# Patient Record
Sex: Female | Born: 1980 | Race: White | Hispanic: No | Marital: Single | Smoking: Never smoker
Health system: Southern US, Community
[De-identification: ages and names within clinical notes are randomized; demographics above are authoritative.]

## PROBLEM LIST (undated history)

## (undated) DIAGNOSIS — Z8742 Personal history of other diseases of the female genital tract: Secondary | ICD-10-CM

## (undated) DIAGNOSIS — K219 Gastro-esophageal reflux disease without esophagitis: Secondary | ICD-10-CM

## (undated) DIAGNOSIS — Z86718 Personal history of other venous thrombosis and embolism: Secondary | ICD-10-CM

## (undated) DIAGNOSIS — G709 Myoneural disorder, unspecified: Secondary | ICD-10-CM

## (undated) DIAGNOSIS — M329 Systemic lupus erythematosus, unspecified: Secondary | ICD-10-CM

## (undated) DIAGNOSIS — I998 Other disorder of circulatory system: Secondary | ICD-10-CM

## (undated) DIAGNOSIS — D1800 Hemangioma unspecified site: Secondary | ICD-10-CM

## (undated) DIAGNOSIS — I959 Hypotension, unspecified: Secondary | ICD-10-CM

## (undated) DIAGNOSIS — Q2112 Patent foramen ovale: Secondary | ICD-10-CM

## (undated) DIAGNOSIS — M797 Fibromyalgia: Secondary | ICD-10-CM

## (undated) DIAGNOSIS — N946 Dysmenorrhea, unspecified: Secondary | ICD-10-CM

## (undated) DIAGNOSIS — I639 Cerebral infarction, unspecified: Secondary | ICD-10-CM

## (undated) DIAGNOSIS — D689 Coagulation defect, unspecified: Secondary | ICD-10-CM

## (undated) DIAGNOSIS — G43009 Migraine without aura, not intractable, without status migrainosus: Secondary | ICD-10-CM

## (undated) DIAGNOSIS — G35 Multiple sclerosis: Secondary | ICD-10-CM

## (undated) DIAGNOSIS — A879 Viral meningitis, unspecified: Secondary | ICD-10-CM

## (undated) DIAGNOSIS — R011 Cardiac murmur, unspecified: Secondary | ICD-10-CM

## (undated) DIAGNOSIS — Z8719 Personal history of other diseases of the digestive system: Secondary | ICD-10-CM

## (undated) HISTORY — DX: Hypotension, unspecified: I95.9

## (undated) HISTORY — DX: Personal history of other diseases of the female genital tract: Z87.42

## (undated) HISTORY — DX: Other disorder of circulatory system: I99.8

## (undated) HISTORY — PX: PELVIC LAPAROSCOPY: SHX162

## (undated) HISTORY — PX: UMBILICAL HERNIA REPAIR: SHX196

## (undated) HISTORY — DX: Myoneural disorder, unspecified: G70.9

## (undated) HISTORY — PX: ABDOMINAL HYSTERECTOMY: SHX81

## (undated) HISTORY — DX: Multiple sclerosis: G35

## (undated) HISTORY — DX: Hemangioma unspecified site: D18.00

## (undated) HISTORY — DX: Personal history of other venous thrombosis and embolism: Z86.718

## (undated) HISTORY — DX: Cerebral infarction, unspecified: I63.9

## (undated) HISTORY — DX: Cardiac murmur, unspecified: R01.1

## (undated) HISTORY — DX: Dysmenorrhea, unspecified: N94.6

## (undated) HISTORY — PX: UPPER GI ENDOSCOPY: SHX6162

## (undated) HISTORY — DX: Coagulation defect, unspecified: D68.9

## (undated) HISTORY — DX: Patent foramen ovale: Q21.12

## (undated) HISTORY — DX: Gastro-esophageal reflux disease without esophagitis: K21.9

## (undated) HISTORY — DX: Migraine without aura, not intractable, without status migrainosus: G43.009

## (undated) HISTORY — PX: TONSILLECTOMY: SUR1361

---
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
## 2009-11-27 HISTORY — PX: OTHER SURGICAL HISTORY: SHX169

## 2017-07-30 ENCOUNTER — Encounter: Payer: Self-pay | Admitting: Internal Medicine

## 2017-12-24 DIAGNOSIS — R251 Tremor, unspecified: Secondary | ICD-10-CM | POA: Insufficient documentation

## 2017-12-24 DIAGNOSIS — G9389 Other specified disorders of brain: Secondary | ICD-10-CM | POA: Insufficient documentation

## 2017-12-24 DIAGNOSIS — R32 Unspecified urinary incontinence: Secondary | ICD-10-CM | POA: Insufficient documentation

## 2017-12-24 DIAGNOSIS — R200 Anesthesia of skin: Secondary | ICD-10-CM | POA: Insufficient documentation

## 2017-12-24 DIAGNOSIS — G959 Disease of spinal cord, unspecified: Secondary | ICD-10-CM | POA: Insufficient documentation

## 2018-01-22 DIAGNOSIS — B977 Papillomavirus as the cause of diseases classified elsewhere: Secondary | ICD-10-CM | POA: Insufficient documentation

## 2018-07-09 ENCOUNTER — Encounter: Payer: Self-pay | Admitting: Internal Medicine

## 2018-10-27 HISTORY — PX: ANTERIOR CRUCIATE LIGAMENT REPAIR: SHX115

## 2018-12-04 ENCOUNTER — Ambulatory Visit (INDEPENDENT_AMBULATORY_CARE_PROVIDER_SITE_OTHER): Payer: Self-pay | Admitting: Internal Medicine

## 2018-12-04 VITALS — BP 102/70 | HR 62 | Temp 99.2°F

## 2018-12-04 DIAGNOSIS — Z4789 Encounter for other orthopedic aftercare: Secondary | ICD-10-CM

## 2018-12-08 ENCOUNTER — Encounter: Payer: Self-pay | Admitting: Internal Medicine

## 2018-12-08 NOTE — Progress Notes (Signed)
   Subjective:    Patient ID: Christy Lowe, female    DOB: 1981-09-01, 38 y.o.   MRN: [id number]  HPI 38 year old Female presents  to the office for the first time today.  Until recently she was living in [place].  On February 24, 2018 she was riding a horse in [place].  Doristine Bosworth was muddy and the horse started to buck and subsequently fell on her injuring her left knee.  She saw an orthopedist in [place] who reviewed her MRI.  There was a complete tear of the ACL and was likely a posterior horn medial meniscal tear as well.  Possible posterior horn lateral meniscal tear.  Bone contusions were present on the posterior aspect of the lateral tibial plateau and the lateral femoral condyle.  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  She elected to have surgery in [place] where her sister was living.  She had that surgery a few weeks ago and is doing well with crutches.  She has just recently rented a house here in [place] and will be staying here but traveling for work.  She needs to continue to have left knee physical therapy.  Had some physical therapy in [place].  Past medical history includes fibrocystic breast disease with mammogram done in [facility] January 2019.  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  Pap smear was unremarkable according to records  Multiple drug allergies including sulfa, Keflex, penicillin and lubiprostone  Patient says she has been seen by neurologist in [place] and was diagnosed with multiple sclerosis.  Apparently has had a number of MRIs in 2017 of the spine and brain.  Do not have these results on file.  Old records indicates she had a hernia repair in January 2013.  Had arthroscopic surgery of the knee in 2013.  Initially began MRI studies in 2015 in [place].  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  I have asked her to try to  find her medical records from [place], [place] and [place].  Review of Systems     Objective:   Physical Exam  I spent 20 minutes speaking with her about these issues.  The main issue today is that she needs to have physical therapy here locally so I have written an order for that.  She will try to locate records from [place] and [place] as well as [place] for her file here.      Assessment & Plan:  Status post left knee surgery in [place] for internal derangement  Plan: Order for physical therapy written

## 2018-12-08 NOTE — Patient Instructions (Signed)
Order for physical therapy written.  Please try to locate your records from [place], [place] and [place] to have on file here

## 2019-01-13 ENCOUNTER — Encounter: Payer: Self-pay | Admitting: Internal Medicine

## 2019-01-13 ENCOUNTER — Telehealth: Payer: Self-pay

## 2019-01-13 ENCOUNTER — Ambulatory Visit (INDEPENDENT_AMBULATORY_CARE_PROVIDER_SITE_OTHER): Payer: PRIVATE HEALTH INSURANCE | Admitting: Internal Medicine

## 2019-01-13 VITALS — BP 140/80 | HR 87 | Temp 99.1°F | Wt 189.0 lb

## 2019-01-13 DIAGNOSIS — J029 Acute pharyngitis, unspecified: Secondary | ICD-10-CM | POA: Diagnosis not present

## 2019-01-13 DIAGNOSIS — J0100 Acute maxillary sinusitis, unspecified: Secondary | ICD-10-CM | POA: Diagnosis not present

## 2019-01-13 DIAGNOSIS — H6503 Acute serous otitis media, bilateral: Secondary | ICD-10-CM

## 2019-01-13 DIAGNOSIS — R0981 Nasal congestion: Secondary | ICD-10-CM

## 2019-01-13 DIAGNOSIS — H9203 Otalgia, bilateral: Secondary | ICD-10-CM

## 2019-01-13 MED ORDER — AZITHROMYCIN 250 MG PO TABS: ORAL_TABLET | ORAL | 1 refills | Status: DC

## 2019-01-13 MED ORDER — METHYLPREDNISOLONE ACETATE 80 MG/ML IJ SUSP
80.0000 mg | Freq: Once | INTRAMUSCULAR | Status: AC
  Administered 2019-01-13: 80 mg via INTRAMUSCULAR

## 2019-01-13 MED ORDER — VALACYCLOVIR HCL 500 MG PO TABS: 500.0000 mg | ORAL_TABLET | Freq: Every day | ORAL | 11 refills | Status: AC

## 2019-01-13 NOTE — Telephone Encounter (Signed)
Patient called is requesting a zpak for sinusitis she said Nicole Kindred told her to call to request this.

## 2019-01-13 NOTE — Telephone Encounter (Signed)
Please make appt

## 2019-01-13 NOTE — Progress Notes (Signed)
   Subjective:    Patient ID: Christy Lowe, female    DOB: 18-Apr-1981, 38 y.o.   MRN: [id number]  HPI 38 year old Female with hx recurrent sinusitis. Had sinus surgery in 2016. Was in [place] last week and then in [place]. Came down with respiratory symptoms. Took Claritin. No fever. Went to [facility] yesterday in [place].  They did not prescribe any antibiotics.  She drove home.  She feels terrible.  A lot of sinus pressure and sore throat.    Review of Systems no fever or shaking chills.  Has sinus pressure, malaise and fatigue     Objective:   Physical Exam Skin warm and dry.  Nodes none.  Pharynx very slightly injected.  Neck supple.  Chest clear to auscultation without rales or wheezing.  She sounds nasally congested and looks fatigued.  Vital signs reviewed.  TMs full but not red.       Assessment & Plan:  Acute maxillary sinusitis  Plan: Depo-Medrol 80 mg IM.  Zithromax Z-PAK take 2 p.o. day 1 followed by 1 p.o. days 2 through 5.  Says this is worked well for her in the past.  Rest and drink plenty of fluids.

## 2019-01-17 ENCOUNTER — Encounter: Payer: Self-pay | Admitting: Internal Medicine

## 2019-01-17 NOTE — Patient Instructions (Signed)
Take Zithromax Z-PAK as directed.  Depo-Medrol 80 mg IM given in office.  Rest and drink plenty of fluids.

## 2019-04-01 ENCOUNTER — Telehealth: Payer: Self-pay

## 2019-04-01 NOTE — Telephone Encounter (Signed)
Set up virtual visit 

## 2019-04-01 NOTE — Telephone Encounter (Signed)
Patient called is wanting to talk with you about how to move forward with a biopsy she said you are aware of what's going and she would like to speak with you briefly. She said her oncologist was supposed to be faxing notes to you. She is kay with virtual visit.

## 2019-04-01 NOTE — Telephone Encounter (Signed)
Spoke with Christy Lowe and she stated that we should receive records from oncologist in the next 24 hours, once we receive those we will set up virtual visit to discuss.

## 2019-04-03 ENCOUNTER — Ambulatory Visit (INDEPENDENT_AMBULATORY_CARE_PROVIDER_SITE_OTHER): Payer: PRIVATE HEALTH INSURANCE | Admitting: Internal Medicine

## 2019-04-03 ENCOUNTER — Other Ambulatory Visit: Payer: Self-pay

## 2019-04-03 DIAGNOSIS — R222 Localized swelling, mass and lump, trunk: Secondary | ICD-10-CM

## 2019-04-03 NOTE — Telephone Encounter (Signed)
Received notes from oncologist, set up virtual visit

## 2019-04-04 ENCOUNTER — Other Ambulatory Visit: Payer: PRIVATE HEALTH INSURANCE | Admitting: Internal Medicine

## 2019-04-04 ENCOUNTER — Other Ambulatory Visit: Payer: Self-pay

## 2019-04-04 ENCOUNTER — Encounter: Payer: PRIVATE HEALTH INSURANCE | Admitting: Internal Medicine

## 2019-04-07 ENCOUNTER — Other Ambulatory Visit: Payer: PRIVATE HEALTH INSURANCE | Admitting: Internal Medicine

## 2019-04-07 VITALS — Temp 98.4°F

## 2019-04-10 ENCOUNTER — Encounter: Payer: Self-pay | Admitting: Internal Medicine

## 2019-04-10 ENCOUNTER — Ambulatory Visit (INDEPENDENT_AMBULATORY_CARE_PROVIDER_SITE_OTHER): Payer: PRIVATE HEALTH INSURANCE | Admitting: Internal Medicine

## 2019-04-10 ENCOUNTER — Other Ambulatory Visit: Payer: Self-pay

## 2019-04-10 VITALS — BP 100/60 | HR 84 | Temp 98.3°F | Ht 66.0 in | Wt 185.0 lb

## 2019-04-10 DIAGNOSIS — E7800 Pure hypercholesterolemia, unspecified: Secondary | ICD-10-CM | POA: Diagnosis not present

## 2019-04-10 DIAGNOSIS — Z8742 Personal history of other diseases of the female genital tract: Secondary | ICD-10-CM | POA: Diagnosis not present

## 2019-04-10 DIAGNOSIS — Z87798 Personal history of other (corrected) congenital malformations: Secondary | ICD-10-CM

## 2019-04-10 DIAGNOSIS — R1310 Dysphagia, unspecified: Secondary | ICD-10-CM | POA: Diagnosis not present

## 2019-04-10 DIAGNOSIS — Z0000 Encounter for general adult medical examination without abnormal findings: Secondary | ICD-10-CM

## 2019-04-10 DIAGNOSIS — R222 Localized swelling, mass and lump, trunk: Secondary | ICD-10-CM

## 2019-04-10 DIAGNOSIS — Z6829 Body mass index (BMI) 29.0-29.9, adult: Secondary | ICD-10-CM

## 2019-04-10 DIAGNOSIS — Z8669 Personal history of other diseases of the nervous system and sense organs: Secondary | ICD-10-CM | POA: Diagnosis not present

## 2019-04-10 DIAGNOSIS — Z86718 Personal history of other venous thrombosis and embolism: Secondary | ICD-10-CM | POA: Diagnosis not present

## 2019-04-11 LAB — CBC WITH DIFFERENTIAL/PLATELET
Absolute Monocytes: 418 cells/uL (ref 200–950)
Basophils Absolute: 31 cells/uL (ref 0–200)
Basophils Relative: 0.6 %
Eosinophils Absolute: 92 cells/uL (ref 15–500)
Eosinophils Relative: 1.8 %
HCT: 38.1 % (ref 35.0–45.0)
Hemoglobin: 12.9 g/dL (ref 11.7–15.5)
Lymphs Abs: 1301 cells/uL (ref 850–3900)
MCH: 30 pg (ref 27.0–33.0)
MCHC: 33.9 g/dL (ref 32.0–36.0)
MCV: 88.6 fL (ref 80.0–100.0)
MPV: 10.4 fL (ref 7.5–12.5)
Monocytes Relative: 8.2 %
Neutro Abs: 3259 cells/uL (ref 1500–7800)
Neutrophils Relative %: 63.9 %
Platelets: 263 10*3/uL (ref 140–400)
RBC: 4.3 10*6/uL (ref 3.80–5.10)
RDW: 13.2 % (ref 11.0–15.0)
Total Lymphocyte: 25.5 %
WBC: 5.1 10*3/uL (ref 3.8–10.8)

## 2019-04-11 LAB — SEDIMENTATION RATE: Sed Rate: 9 mm/h (ref 0–20)

## 2019-04-11 LAB — CP4508-PT/INR AND PTT
INR: 1
Prothrombin Time: 10 s (ref 9.0–11.5)
aPTT: 32 s (ref 22–34)

## 2019-04-11 LAB — LIPID PANEL
Cholesterol: 192 mg/dL (ref <200)
HDL: 55 mg/dL (ref >=50)
LDL Cholesterol (Calc): 120 mg/dL (calc) — ABNORMAL HIGH
Non-HDL Cholesterol (Calc): 137 mg/dL (calc) — ABNORMAL HIGH (ref <130)
Total CHOL/HDL Ratio: 3.5 (calc) (ref <5.0)
Triglycerides: 80 mg/dL (ref <150)

## 2019-04-11 LAB — COMPLETE METABOLIC PANEL WITH GFR
AG Ratio: 1.7 (calc) (ref 1.0–2.5)
ALT: 10 U/L (ref 6–29)
AST: 16 U/L (ref 10–30)
Albumin: 4.4 g/dL (ref 3.6–5.1)
Alkaline phosphatase (APISO): 66 U/L (ref 31–125)
BUN: 14 mg/dL (ref 7–25)
CO2: 26 mmol/L (ref 20–32)
Calcium: 9.4 mg/dL (ref 8.6–10.2)
Chloride: 104 mmol/L (ref 98–110)
Creat: 0.86 mg/dL (ref 0.50–1.10)
GFR, Est African American: 101 mL/min/{1.73_m2} (ref >=60)
GFR, Est Non African American: 88 mL/min/{1.73_m2} (ref >=60)
Globulin: 2.6 g/dL (calc) (ref 1.9–3.7)
Glucose, Bld: 86 mg/dL (ref 65–99)
Potassium: 4.4 mmol/L (ref 3.5–5.3)
Sodium: 137 mmol/L (ref 135–146)
Total Bilirubin: 0.5 mg/dL (ref 0.2–1.2)
Total Protein: 7 g/dL (ref 6.1–8.1)

## 2019-04-11 LAB — FACTOR 5 LEIDEN

## 2019-04-11 LAB — TSH: TSH: 2.61 mIU/L

## 2019-04-14 ENCOUNTER — Other Ambulatory Visit: Payer: PRIVATE HEALTH INSURANCE

## 2019-04-21 ENCOUNTER — Encounter: Payer: Self-pay | Admitting: Internal Medicine

## 2019-04-21 NOTE — Patient Instructions (Signed)
Records reviewed from [place] patient did not have palpable abnormality to biopsy in left supraclavicular area.  There is a history of d-dimer elevation that is mild.  Patient will have records from [place].  She will schedule a physical exam in the near future.

## 2019-04-21 NOTE — Progress Notes (Signed)
Subjective:    Patient ID: Christy Lowe, female    DOB: 25-Nov-1981, 38 y.o.   MRN: [id number]  HPI 38 year old White Female with complex past medical history as moved here in [place] from [place].  Previously lived in [place] before moving to [place].  Currently at the time of visit, I do not have old records from [place].  Patient says she will have these faxed over.  These records were received after the visit.  Spent 20 minutes reviewing these records.  Seen today by interactive audio and video telecommunications due to coronavirus pandemic.  Patient agrees to visit via this format today.  She is identified using 2 identifiers as [place].  Patient has some concerns about fullness in the left supraclavicular area which is been present for some time.  She is questioning need for biopsy.  Records subsequently obtained late afternoon after her visit from [facility] in [place], [place] dated July 09, 2018 and indicate patient was seen there regarding mass left side of the neck.  Was found not to have any palpable adenopathy but a fullness in the left supraclavicular area.  Apparently she underwent a CT of the neck and chest January 18, 2018 for this fullness/neck mass.  There were some mildly prominent cervical chain lymph nodes 9 x 6 mm on the left.  Thyroglossal duct cyst was present.  Was referred to interventional radiology regarding possible biopsy but biopsy was not recommended by interventional radiology March 2019.  Had repeat CT of the neck May 2019 which was unrevealing.  Explained to patient that her past medical history was quite complex and it would be best to have all of her records from [place] and [place] sent here and schedule a physical examination.  She agrees to do this in the near future.     Review of Systems see above     Objective:   Physical Exam  Not examined.  Spent 15 minutes including review of records from [place] 0  dated July 09, 2018 outlining work-up for possible left supraclavicular mass.  2 BBs were placed on the left indicating palpable abnormalities the BB in the left neck just above the thyroid overlying the sternocleidomastoid muscle was without underlying abnormality.  Mildly prominent cervical chain lymph nodes were noted.  The second may be which marked palpable abnormality showed no underlying abnormality.  A thyroglossal duct cyst was present.  This is been seen previously.  There is also some history of possible clotting disorder which is vague.  Patient indicates she had a history of elevated d-dimer documented in 20 16/2017.  The origin for this finding is not clear.  It is unclear the clinical relevance of the elevated d-dimer.  D-dimer checked by her oncologist in [facility] by Dr. Mcarthur Rossetti was elevated at 562 with normal being less than 500  Patient reports history of DVT in the past.  Patient takes 81 mg of aspirin daily.  No family history of clotting disorder that patient is aware of.      Assessment & Plan:  History of fullness in left supraclavicular area but no mass detected on CT scan and interventional radiology in [place] did not think biopsy was indicated  History of elevated d-dimer of 562 normal being less than 500 with history of DVT.  Would like patient checked for factor V Leiden.  She is not aware of any family history of clotting disorder.  Patient agrees to schedule physical  exam in the near future to sort out complex medical issues and questions she has about her health.  Patient also mentions the possibility of having multiple sclerosis.  25 minutes spent with patient visit and reviewing records [place].

## 2019-04-22 ENCOUNTER — Other Ambulatory Visit: Payer: Self-pay

## 2019-04-22 MED ORDER — TRAZODONE HCL 100 MG PO TABS: 100.0000 mg | ORAL_TABLET | Freq: Every day | ORAL | 1 refills | Status: DC

## 2019-04-22 MED ORDER — ALPRAZOLAM 0.5 MG PO TABS: 0.5000 mg | ORAL_TABLET | Freq: Every day | ORAL | 0 refills | Status: DC | PRN

## 2019-04-29 ENCOUNTER — Other Ambulatory Visit: Payer: Self-pay | Admitting: Internal Medicine

## 2019-04-29 ENCOUNTER — Ambulatory Visit
Admission: RE | Admit: 2019-04-29 | Discharge: 2019-04-29 | Disposition: A | Discharge status: Home / Self Care | Payer: PRIVATE HEALTH INSURANCE | Source: Ambulatory Visit | Attending: Internal Medicine | Admitting: Internal Medicine

## 2019-04-29 DIAGNOSIS — R1310 Dysphagia, unspecified: Secondary | ICD-10-CM

## 2019-04-29 DIAGNOSIS — K222 Esophageal obstruction: Secondary | ICD-10-CM

## 2019-04-29 DIAGNOSIS — R29810 Facial weakness: Secondary | ICD-10-CM

## 2019-04-29 NOTE — Telephone Encounter (Signed)
Pt has hx of elevated d-dimer. Recent Factor V Leiden testing was normal. Has complex history. I called to give her results of barium swallow like has peptic stricture. GI referral being made. Last night, had sudden onset with memory, thick speech and right mouth droop. Took 2 ASA tabs. There is some question of multiple sclerosis although we do not have MRIs to review- only have reports from out of state. No extremity weakness. Syptoms resolved spontaneously and she did not go to ED. ? TIA to see Neurologist as well.

## 2019-04-30 ENCOUNTER — Encounter: Payer: Self-pay | Admitting: Internal Medicine

## 2019-04-30 NOTE — Patient Instructions (Addendum)
We will proceed with barium swallow to rule out stricture.  Likely will need neurological consultation to sort out issues of right-sided facial weakness history with of episodic headaches consistent with migraine.  Could have complicated migraine versus recurrent TIAs.  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  Patient is under the impression she may have multiple sclerosis.

## 2019-04-30 NOTE — Progress Notes (Signed)
Subjective:    Patient ID: Christy Lowe, female    DOB: May 28, 1981, 38 y.o.   MRN: [id number]  HPI 38 year old Female presents to the office for health maintenance exam and evaluation of multiple complex medical issues.   She is allergic to cephalexin causes hives, penicillin causes hives, sulfa causes shortness of breath lubiprostone causes shortness of breath.  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  ACL repair was done in [place] where her sister resides but she received physical therapy here for several weeks postoperatively.  She has now recovered well.  Menarche at age 8, no pregnancies or miscarriages.  Non-smoker.  Social alcohol consumption.  SHx: living with female partner who is Customer service manager and judge. She is a Clinical research associate. Sister lives in [place].  Family history: Maternal grandmother with history of myelodysplastic syndrome.  Paternal aunt with breast cancer and cervical cancer in her 40s.  Paternal grandfather with prostate cancer in his 23s.  No apparent history of clotting disorders in the family.  Prior records indicates she had a hernia repair in January 2013, arthroscopic surgery of the knee in 2013.  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  I have asked her to find her medical records but she has yet to do so other than what we have here from [place].  I was able to get some on care everywhere from [place].  Patient has a history of migraine headaches and has had 3 over the past 5 months.  Takes ibuprofen.  History of thyroglossal  duct cyst noted on CT scan.  She is complaining of fullness in the left supraclavicular area.  Review of old records indicate there was no mass noted in that area.  She did have some small pulmonary nodules on chest CT.  These are thought to be benign and stable from previous reports.  Patient indicates that she had meningitis related to about infectious mononucleosis at age 51.  Said she developed a blood clot in her right leg at age 83.  Says at age 81 she had a TIA with right facial droop and right arm weakness while on oral contraceptives.  Records from [place] dated September 2018 indicates she had low back pain with an MRI of the LS spine showing focal posterior disc protrusion L5-S1 with annular tear.  Today she was tested for factor V Leiden coagulopathy and that result is negative.  She had a CT of the neck and soft tissues surrounding the neck area May 2019 for possible left neck mass due to supraclavicular fullness detected on exam.  Thyroid gland was normal.  Submandibular glands were normal.  BBs were placed on the left supraclavicular area where abnormalities were noted.  No underlying abnormality was found.  Only abnormal finding was a thyroglossal duct cyst.  Follow-up CT was recommended in February 2020 but patient moved away from [place] and this was not done.  Patient is concerned about this.  It has not caused her any issues.  D-dimer was noted to be 562 with normal being less than 500 February 2019  She  actually had 2 CTs of the neck in 2019 1 in February and the repeat one in May which did not show any change in findings described a new finding.  She had a limited ultrasound of the thyroid and neck in March 2019 with benign appearing small cervical lymph nodes.  No biopsy was performed because they could not locate a definite area to biopsy.  In September 2018 she was admitted to hospital in [place] with right-sided facial numbness.  She was not given TPA.  MRI did not show  any demyelinating disease of the brain or LS-spine.  She was thought not to have a stroke and was treated with prednisone for 5 days.  There was a 1.3 x 0.9 cm mass along the anterior falx which was thought to represent an area of osseous metaplasia.  In 2011 she had an endoscopy and an esophageal biopsy.  This was for complaint of dysphagia.  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  Patient apparently did not understand the tumor was benign.  A lot of her medical care has been fragmented.  I have been trying to pull this together for her over the past several weeks.  She needs to have some answers about her neurological status.  We will seek neurology consultation.  She needs to have GI consultation regarding dysphasia.  We are going to start with a barium swallow.  She may have a stricture or ring that needs to be dilated.  I have tested her today for factor V Leiden which would be a common cause of DVTs but she is negative for that.  I am not sure that relevance of the very mildly elevated d-dimer.  For now I think it is wise for her to stay on aspirin 325 mg daily.  There is also the issue of whether or not she truly has multiple sclerosis.  She needs further evaluation with neurologist.  She definitely has history of migraine headaches.  She  travels extensively and it is important to get her medical issues clearly defined in case she has to seek medical care out of state.  She needs to understand what her medical issues really are.  It's all been fragmented care that is confusing to her.     Records indicates she moved from [place] to  to [place] in June 2018.  Previously married once.  No children.  In the process of selling her property in [place] and divorcing.  History of fibrocystic breast disease.  Patient says that she has history of lower extremity DVT.  Says she went to the emergency department in 2015 in [place] for right leg pain.  Doppler study was negative.  She says she  has a history of elevated d-dimer.  She takes aspirin particularly when she is driving for long distances.  Family history: History of hypertension and MI in paternal grandfather.  Brother with history of Guillain- Barr syndrome.  Extensive time has been spent reviewing her records that are available in care everywhere and knows that she has been faxed to [place].  In February 2018 she had genetic testing done at [facility] in [place].  This was for prepregnancy consultation.  Was not found to have any genetic issues that would interfere with pregnancy.  She also saw a physician regarding neurological symptoms and no time.  She had MRI/MRA of the C-spine and T-spine which were normal.  She had a normal lumbar puncture.  Patient indicates she has had  2 TIAs and that the last one was around 2014.  She currently takes aspirin 325 mg daily.  History of menorrhagia and dysmenorrhea.  Had 2D echocardiogram in 2017 showing a small PFO.  In 2011 she had endoscopy in [place] and a polyp was noted in the esophagus.  Subsequently had biopsy and it was found to be a granular cell tumor.  Patient complaining of food sticking in her throat at the present time.  Has had some severe episodes with that recently.  Barium swallow will be ordered.    Review of Systems see above she complains of fatigue.  Knee has improved and is much less painful after surgery.     Objective:   Physical Exam Vital signs reviewed and are stable.  Skin warm and dry.  No adenopathy appreciated in the cervical or left supraclavicular area.  Pharynx is clear.  Tongue is midline.  TMs are clear.  Neck is supple.  No thyromegaly appreciated.  Chest is clear to auscultation without rales or wheezing.  Cardiac exam regular rate and rhythm normal S1 and S2 without murmurs or gallops.  No S3 appreciated.  Abdomen bowel sounds are active no hepatosplenomegaly masses or tenderness.  Pelvic exam: Normal female external  genitalia.  Pap smear taken.  No masses on bimanual exam.  Rectovaginal confirms.  Extremities without deformity.  Neurological exam: Cranial nerves II through XII are grossly intact.  No facial weakness detected.  Muscle strength appears to be normal in the upper and lower extremities with deep tendon reflexes normal in the upper and lower extremities.  No visual field deficit.  PERRLA.  Gait is normal.  Cerebellar finger-to-nose testing is within normal limits in both right and left upper extremities.       Assessment & Plan:  History of?  Left supraclavicular fullness without clear evidence of adenopathy.  Patient asking about biopsy.  It would appear from previous CT scan there was nothing there to biopsy.  History of episodes of facial weakness that would suggest possible TIA versus complicated migraine.  Is also has been postulated in prior medical records that she may have multiple sclerosis.  She will be referred to neurologist.  History of migraine headaches  History of DVT-test for factor V Leiden.  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  Her labs have been reviewed including factor V Leiden which proved to be negative, sed rate of 9.  Normal CBC with differential and C met.  TSH is normal.  Lipid panel normal with the exception of an LDL of 120 with HDL of 55.  Triglycerides are normal.  Currently medications include Xanax 0.5 mg daily on a as needed basis.  Trazodone 100 mg daily.  Barium swallow to see if she has an esophageal stricture.  Referral to neurology regarding recurrent neurological symptoms consistent with migraine headache complicated versus MS.  Thyroglossal duct cyst.  Patient reassured nothing needs to be done at the present time.  ?  Left supraclavicular fullness.  Previous imaging studies have not defined a mass  or adenopathy that is conclusive.  The fullness is noted on exam but is not extreme and no palpable mass is appreciated today.  History of DVT-may need hematology consult for evaluation of possible coagulopathy.  Apparently one episode occurred when she was on oral contraceptives.  Factor V Leiden is negative.  History of anxiety  and depression treated with Desiree L and as needed Xanax.  Discussed these issues at length with patient.  She understands and agrees to proceed with these consultations and barium swallow.

## 2019-05-05 ENCOUNTER — Ambulatory Visit (INDEPENDENT_AMBULATORY_CARE_PROVIDER_SITE_OTHER): Payer: PRIVATE HEALTH INSURANCE | Admitting: Gastroenterology

## 2019-05-05 ENCOUNTER — Other Ambulatory Visit: Payer: Self-pay

## 2019-05-05 ENCOUNTER — Encounter: Payer: Self-pay | Admitting: Gastroenterology

## 2019-05-05 VITALS — Ht 67.0 in | Wt 180.0 lb

## 2019-05-05 DIAGNOSIS — R1310 Dysphagia, unspecified: Secondary | ICD-10-CM

## 2019-05-05 DIAGNOSIS — Z791 Long term (current) use of non-steroidal anti-inflammatories (NSAID): Secondary | ICD-10-CM

## 2019-05-05 DIAGNOSIS — K2100 Gastro-esophageal reflux disease with esophagitis, without bleeding: Secondary | ICD-10-CM

## 2019-05-05 DIAGNOSIS — G8929 Other chronic pain: Secondary | ICD-10-CM

## 2019-05-05 DIAGNOSIS — R1013 Epigastric pain: Secondary | ICD-10-CM

## 2019-05-05 DIAGNOSIS — R1319 Other dysphagia: Secondary | ICD-10-CM

## 2019-05-05 MED ORDER — OMEPRAZOLE 40 MG PO CPDR: 40.0000 mg | DELAYED_RELEASE_CAPSULE | Freq: Every day | ORAL | 3 refills | Status: DC

## 2019-05-05 MED ORDER — SUCRALFATE 1 G PO TABS: 1.0000 g | ORAL_TABLET | Freq: Three times a day (TID) | ORAL | 2 refills | Status: DC

## 2019-05-05 NOTE — Progress Notes (Signed)
Christy Lowe    [id number]    1981-09-22  Primary Care Physician:Baxley, Cresenciano Lick, MD  Referring Physician: [name], MD [address] [place], [place] [address]  This service was provided via audio and video telemedicine (Doximity) due to [place] 19 pandemic.  Patient location: Home Provider location: Office Used 2 patient identifiers to confirm the correct person. Explained the limitations in evaluation and management via telemedicine. Patient is aware of potential medical charges for this visit.  Patient consented to this virtual visit.  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 Prior records not available to review during this visit.  Based on chart review in epic and Dr. Verlene Mayer note patient has had extensive medical work-up in [place], [place] and also [place].  2011 EGD in [place] with removal of polypoid lesion in the esophagus, biopsy positive for granular cell tumor  Esophagram April 29, 2019 1. Small sliding hiatal hernia. Moderate gastroesophageal reflux elicited. 2. 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 No esophageal mass or ulcer detected. 3. Normal esophageal motility. 4. Normal oral and pharyngeal phases of swallowing.  Dysphagia is progressively worse after knee surgery last year.  She has most difficulty with meat specially dry chicken and steak.  She is taking over-the-counter omeprazole with no improvement  Denies any episodes of food impaction.  No vomiting, melena or blood per rectum. She has epigastric abdominal pain and burning sensation.  Takes NSAIDs intermittently.  No family history of GI malignancy     Outpatient Encounter Medications as of 05/05/2019  Medication Sig  . aspirin 325 MG tablet Take 325 mg by mouth daily. Takes every day  . DULoxetine (CYMBALTA) 30 MG capsule Take 30 mg by mouth daily.  . [DISCONTINUED] ALPRAZolam (XANAX) 0.5 MG tablet Take 1 tablet (0.5 mg total) by mouth daily as needed for anxiety. Do not exceed more than 2 a day. (Patient not taking: Reported on 05/05/2019)  . [DISCONTINUED] traZODone (DESYREL) 100 MG tablet Take 1 tablet (100 mg total) by mouth daily. (Patient not taking: Reported on 05/05/2019)   No facility-administered encounter medications on file as of 05/05/2019.     Allergies as of 05/05/2019 - Review Complete 04/30/2019  Allergen Reaction Noted  . Cephalexin Hives and Shortness Of Breath 06/23/2014  . Penicillins Hives and Anaphylaxis 06/23/2014  . Lubiprostone  01/13/2019  . Sulfa antibiotics Other (See Comments) 06/23/2014    Past Medical History:  Diagnosis Date  . Hemangioma   . Hypotension   . MS (multiple sclerosis) ([place])     Past Surgical History:  Procedure Laterality Date  . ANTERIOR CRUCIATE LIGAMENT REPAIR    . TONSILLECTOMY      Family History  Problem Relation Age of Onset  . Prostate cancer Paternal Grandfather   . Breast cancer Other     Social History   Socioeconomic History  . Marital status: Unknown    Spouse name: Not on file  . Number of children: Not on file  . Years of education: Not on file  . Highest education level: Not on file  Occupational History  . Not on file  Social Needs  . Financial resource strain: Not on  file  . Food insecurity:    Worry: Not on file    Inability: Not on file  . Transportation needs:    Medical: Not on file    Non-medical: Not on file  Tobacco Use  . Smoking status: Never Smoker  . Smokeless tobacco: Never Used  Substance and Sexual Activity  . Alcohol use: Yes    Comment: once a week  . Drug use: Not on file  . Sexual activity: Not on file  Lifestyle  . Physical  activity:    Days per week: Not on file    Minutes per session: Not on file  . Stress: Not on file  Relationships  . Social connections:    Talks on phone: Not on file    Gets together: Not on file    Attends religious service: Not on file    Active member of club or organization: Not on file    Attends meetings of clubs or organizations: Not on file    Relationship status: Not on file  . Intimate partner violence:    Fear of current or ex partner: Not on file    Emotionally abused: Not on file    Physically abused: Not on file    Forced sexual activity: Not on file  Other Topics Concern  . Not on file  Social History Narrative  . Not on file      Review of systems: Review of Systems as per HPI All other systems reviewed and are negative.   Physical Exam: Vitals were not taken and physical exam was not performed during this virtual visit.  Data Reviewed:  Reviewed labs, radiology imaging, old records and pertinent past GI work up   Assessment and Plan/Recommendations:  38 year old female with history of factor V Leyden mutation on chronic aspirin 325 mg daily, with complaints of dysphagia and epigastric abdominal pain  Barium esophagram suggestive of peptic stricture  Schedule for EGD with esophageal biopsies and dilation  Increase omeprazole 40 mg daily, 30 minutes before breakfast  Carafate 1 g tablet 3 times daily before meals and at bedtime as needed  Avoid tough meat, dense bread and raw vegetables until EGD  Antireflux measures  Avoid NSAIDs other than aspirin 325 mg daily  The risks and benefits as well as alternatives of endoscopic procedure(s) have been discussed and reviewed. All questions answered. The patient agrees to proceed.  Damaris Hippo , MD   CC: [name], MD

## 2019-05-05 NOTE — Patient Instructions (Addendum)
Increase to omeprazole 40 mg daily, 30 minutes before breakfast  Carafate 1 g tablet 3 times daily before meals and at bedtime as needed  Schedule EGD with esophageal dilation (05/09/2019)  You have been scheduled for an endoscopy. Please follow written instructions given to you at your visit today. If you use inhalers (even only as needed), please bring them with you on the day of your procedure.  Avoid tough meat, dense bread and raw vegetables until EGD  Antireflux measures  Avoid NSAIDs other than aspirin 325 mg daily   Gastroesophageal Reflux Disease, Adult Gastroesophageal reflux (GER) happens when acid from the stomach flows up into the tube that connects the mouth and the stomach (esophagus). Normally, food travels down the esophagus and stays in the stomach to be digested. However, when a person has GER, food and stomach acid sometimes move back up into the esophagus. If this becomes a more serious problem, the person may be diagnosed with a disease called gastroesophageal reflux disease (GERD). GERD occurs when the reflux:  Happens often.  Causes frequent or severe symptoms.  Causes problems such as damage to the esophagus. When stomach acid comes in contact with the esophagus, the acid may cause soreness (inflammation) in the esophagus. Over time, GERD may create small holes (ulcers) in the lining of the esophagus. What are the causes? This condition is caused by a problem with the muscle between the esophagus and the stomach (lower esophageal sphincter, or LES). Normally, the LES muscle closes after food passes through the esophagus to the stomach. When the LES is weakened or abnormal, it does not close properly, and that allows food and stomach acid to go back up into the esophagus. The LES can be weakened by certain dietary substances, medicines, and medical conditions, including:  Tobacco use.  Pregnancy.  Having a hiatal hernia.  Alcohol use.  Certain foods and  beverages, such as coffee, chocolate, onions, and peppermint. What increases the risk? You are more likely to develop this condition if you:  Have an increased body weight.  Have a connective tissue disorder.  Use NSAID medicines. What are the signs or symptoms? Symptoms of this condition include:  Heartburn.  Difficult or painful swallowing.  The feeling of having a lump in the throat.  Abitter taste in the mouth.  Bad breath.  Having a large amount of saliva.  Having an upset or bloated stomach.  Belching.  Chest pain. Different conditions can cause chest pain. Make sure you see your health care provider if you experience chest pain.  Shortness of breath or wheezing.  Ongoing (chronic) cough or a night-time cough.  Wearing away of tooth enamel.  Weight loss. How is this diagnosed? Your health care provider will take a medical history and perform a physical exam. To determine if you have mild or severe GERD, your health care provider may also monitor how you respond to treatment. You may also have tests, including:  A test to examine your stomach and esophagus with a small camera (endoscopy).  A test thatmeasures the acidity level in your esophagus.  A test thatmeasures how much pressure is on your esophagus.  A barium swallow or modified barium swallow test to show the shape, size, and functioning of your esophagus. How is this treated? The goal of treatment is to help relieve your symptoms and to prevent complications. Treatment for this condition may vary depending on how severe your symptoms are. Your health care provider may recommend:  Changes to your  diet.  Medicine.  Surgery. Follow these instructions at home: Eating and drinking   Follow a diet as recommended by your health care provider. This may involve avoiding foods and drinks such as: ? Coffee and tea (with or without caffeine). ? Drinks that containalcohol. ? Energy drinks and sports  drinks. ? Carbonated drinks or sodas. ? Chocolate and cocoa. ? Peppermint and mint flavorings. ? Garlic and onions. ? Horseradish. ? Spicy and acidic foods, including peppers, chili powder, curry powder, vinegar, hot sauces, and barbecue sauce. ? Citrus fruit juices and citrus fruits, such as oranges, lemons, and limes. ? Tomato-based foods, such as red sauce, chili, salsa, and pizza with red sauce. ? Fried and fatty foods, such as donuts, french fries, potato chips, and high-fat dressings. ? High-fat meats, such as hot dogs and fatty cuts of red and white meats, such as rib eye steak, sausage, ham, and bacon. ? High-fat dairy items, such as whole milk, butter, and cream cheese.  Eat small, frequent meals instead of large meals.  Avoid drinking large amounts of liquid with your meals.  Avoid eating meals during the 2-3 hours before bedtime.  Avoid lying down right after you eat.  Do not exercise right after you eat. Lifestyle   Do not use any products that contain nicotine or tobacco, such as cigarettes, e-cigarettes, and chewing tobacco. If you need help quitting, ask your health care provider.  Try to reduce your stress by using methods such as yoga or meditation. If you need help reducing stress, ask your health care provider.  If you are overweight, reduce your weight to an amount that is healthy for you. Ask your health care provider for guidance about a safe weight loss goal. General instructions  Pay attention to any changes in your symptoms.  Take over-the-counter and prescription medicines only as told by your health care provider. Do not take aspirin, ibuprofen, or other NSAIDs unless your health care provider told you to do so.  Wear loose-fitting clothing. Do not wear anything tight around your waist that causes pressure on your abdomen.  Raise (elevate) the head of your bed about 6 inches (15 cm).  Avoid bending over if this makes your symptoms worse.  Keep all  follow-up visits as told by your health care provider. This is important. Contact a health care provider if:  You have: ? New symptoms. ? Unexplained weight loss. ? Difficulty swallowing or it hurts to swallow. ? Wheezing or a persistent cough. ? A hoarse voice.  Your symptoms do not improve with treatment. Get help right away if you:  Have pain in your arms, neck, jaw, teeth, or back.  Feel sweaty, dizzy, or light-headed.  Have chest pain or shortness of breath.  Vomit and your vomit looks like blood or coffee grounds.  Faint.  Have stool that is bloody or black.  Cannot swallow, drink, or eat. Summary  Gastroesophageal reflux happens when acid from the stomach flows up into the esophagus. GERD is a disease in which the reflux happens often, causes frequent or severe symptoms, or causes problems such as damage to the esophagus.  Treatment for this condition may vary depending on how severe your symptoms are. Your health care provider may recommend diet and lifestyle changes, medicine, or surgery.  Contact a health care provider if you have new or worsening symptoms.  Take over-the-counter and prescription medicines only as told by your health care provider. Do not take aspirin, ibuprofen, or other NSAIDs unless your  health care provider told you to do so.  Keep all follow-up visits as told by your health care provider. This is important. This information is not intended to replace advice given to you by your health care provider. Make sure you discuss any questions you have with your health care provider. Document Released: 08/23/2005 Document Revised: 05/22/2018 Document Reviewed: 05/22/2018 Elsevier Interactive Patient Education  2019 [employer].   Follow-up office telemedicine visit in 2 to 3 months

## 2019-05-06 ENCOUNTER — Telehealth: Payer: Self-pay | Admitting: Neurology

## 2019-05-06 NOTE — Telephone Encounter (Signed)
°  Due to current COVID 19 pandemic, our office is severely reducing in office visits until further notice, in order to minimize the risk to our patients and healthcare providers.    Called patient and scheduled a virtual visit with Dr. Krista Blue for 6/17. Patient verbalized understanding of the [email or web address] process and I have sent an e-mail to [email or web address] with link and instructions as well as my name and our office number. Patient understands that they will receive a call from RN to update chart.   Pt understands that although there may be some limitations with this type of visit, we will take all precautions to reduce any security or privacy concerns.  Pt understands that this will be treated like an in office visit and we will file with pt's insurance, and there may be a patient responsible charge related to this service.

## 2019-05-07 ENCOUNTER — Encounter: Payer: Self-pay | Admitting: Gastroenterology

## 2019-05-07 ENCOUNTER — Telehealth: Payer: Self-pay | Admitting: *Deleted

## 2019-05-07 NOTE — Telephone Encounter (Signed)
Covid-19 screening questions  Have you traveled in the last 14 days? No If yes where?  Do you now or have you had a fever in the last 14 days?  Do you have any respiratory symptoms of shortness of breath or cough now or in the last 14 days? no  Do you have any family members or close contacts with diagnosed or suspected Covid-19 in the past 14 days? no  Have you been tested for Covid-19 and found to be positive? no

## 2019-05-09 ENCOUNTER — Ambulatory Visit (AMBULATORY_SURGERY_CENTER): Payer: PRIVATE HEALTH INSURANCE | Admitting: Gastroenterology

## 2019-05-09 ENCOUNTER — Encounter: Payer: Self-pay | Admitting: Gastroenterology

## 2019-05-09 ENCOUNTER — Other Ambulatory Visit: Payer: Self-pay

## 2019-05-09 VITALS — BP 86/53 | HR 59 | Temp 98.4°F | Resp 16 | Ht 67.0 in | Wt 180.0 lb

## 2019-05-09 DIAGNOSIS — K449 Diaphragmatic hernia without obstruction or gangrene: Secondary | ICD-10-CM | POA: Diagnosis not present

## 2019-05-09 DIAGNOSIS — K219 Gastro-esophageal reflux disease without esophagitis: Secondary | ICD-10-CM

## 2019-05-09 DIAGNOSIS — R1310 Dysphagia, unspecified: Secondary | ICD-10-CM

## 2019-05-09 DIAGNOSIS — K2970 Gastritis, unspecified, without bleeding: Secondary | ICD-10-CM

## 2019-05-09 DIAGNOSIS — K222 Esophageal obstruction: Secondary | ICD-10-CM | POA: Diagnosis not present

## 2019-05-09 DIAGNOSIS — K200 Eosinophilic esophagitis: Secondary | ICD-10-CM | POA: Diagnosis not present

## 2019-05-09 DIAGNOSIS — K2980 Duodenitis without bleeding: Secondary | ICD-10-CM | POA: Diagnosis not present

## 2019-05-09 MED ORDER — SODIUM CHLORIDE 0.9 % IV SOLN: 500.0000 mL | Freq: Once | INTRAVENOUS | Status: DC

## 2019-05-09 NOTE — Progress Notes (Signed)
Nancy Campbell, LPN- Temp Judy Branson, CMA- Vitals 

## 2019-05-09 NOTE — Progress Notes (Signed)
Pt's states no medical or surgical changes since previsit or office visit. 

## 2019-05-09 NOTE — Op Note (Signed)
[place] Patient Name: Christy Lowe Procedure Date: 05/09/2019 10:34 AM MRN: [id number] Endoscopist: Mauri Pole , MD Age: 38 Referring MD:  Date of Birth: 25-Aug-1981 Gender: Female Account #: [id number] Procedure:                Upper GI endoscopy Indications:              Dysphagia, Epigastric abdominal pain Medicines:                Monitored Anesthesia Care Procedure:                Pre-Anesthesia Assessment:                           - Prior to the procedure, a History and Physical                            was performed, and patient medications and                            allergies were reviewed. The patient's tolerance of                            previous anesthesia was also reviewed. The risks                            and benefits of the procedure and the sedation                            options and risks were discussed with the patient.                            All questions were answered, and informed consent                            was obtained. Prior Anticoagulants: The patient has                            taken no previous anticoagulant or antiplatelet                            agents. ASA Grade Assessment: III - A patient with                            severe systemic disease. After reviewing the risks                            and benefits, the patient was deemed in                            satisfactory condition to undergo the procedure.                           After obtaining informed consent, the endoscope was  passed under direct vision. Throughout the                            procedure, the patient's blood pressure, pulse, and                            oxygen saturations were monitored continuously. The                            Endoscope was introduced through the mouth, and                            advanced to the second part of duodenum. The upper                            GI  endoscopy was accomplished without difficulty.                            The patient tolerated the procedure well. Scope In: Scope Out: Findings:                 LA Grade B (one or more mucosal breaks greater than                            5 mm, not extending between the tops of two mucosal                            folds) esophagitis with no bleeding was found 28 to                            35 cm from the incisors. Biopsies were obtained                            from the proximal and distal esophagus with cold                            forceps for histology of suspected eosinophilic                            esophagitis.                           One benign-appearing, intrinsic mild stenosis was                            found 34 to 35 cm from the incisors. This stenosis                            measured 1.6 cm (inner diameter) x less than one cm                            (in length). The stenosis was traversed. A TTS  dilator was passed through the scope. Dilation with                            an 18-19-20 mm balloon dilator was performed to 20                            mm. The dilation site was examined following                            endoscope reinsertion and showed mild mucosal                            disruption.                           A small hiatal hernia was present.                           Patchy mild inflammation characterized by                            congestion (edema) and erythema was found in the                            entire examined stomach. Biopsies were taken with a                            cold forceps for Helicobacter pylori testing.                           A few diffuse, less than 1 mm erosions were found                            in the duodenal bulb. Biopsies were taken with a                            cold forceps for histology. Complications:            No immediate complications. Estimated Blood  Loss:     Estimated blood loss was minimal. Impression:               - LA Grade B reflux esophagitis. Biopsied.                           - Benign-appearing esophageal stenosis. Dilated.                           - Small hiatal hernia.                           - Gastritis. Biopsied.                           - Erosive duodenopathy. Biopsied. Recommendation:           - Resume previous diet.                           -  Continue present medications.                           - Await pathology results.                           - No ibuprofen, naproxen, or other non-steroidal                            anti-inflammatory drugs.                           - Return to my office, next available tele visit in                            2 months. Mauri Pole, MD 05/09/2019 10:56:53 AM This report has been signed electronically.

## 2019-05-09 NOTE — Patient Instructions (Signed)
Handouts given for Hiatal hernia, Esophagitis and Stricture.  No Nsaids (Ibuprofen, Naproxen, Aleve or Aspirin)  Return to office in 2 months via [place]:   Refer to the procedure report that was given to you for any specific questions about what was found during the examination.  If the procedure report does not answer your questions, please call your gastroenterologist to clarify.  If you requested that your care partner not be given the details of your procedure findings, then the procedure report has been included in a sealed envelope for you to review at your convenience later.  YOU SHOULD EXPECT: Some feelings of bloating in the abdomen. Passage of more gas than usual.  Walking can help get rid of the air that was put into your GI tract during the procedure and reduce the bloating. If you had a lower endoscopy (such as a colonoscopy or flexible sigmoidoscopy) you may notice spotting of blood in your stool or on the toilet paper. If you underwent a bowel prep for your procedure, you may not have a normal bowel movement for a few days.  Please Note:  You might notice some irritation and congestion in your nose or some drainage.  This is from the oxygen used during your procedure.  There is no need for concern and it should clear up in a day or so.  SYMPTOMS TO REPORT IMMEDIATELY:  Fever of 100F or higher   Following upper endoscopy (EGD)  Vomiting of blood or coffee ground material  New chest pain or pain under the shoulder blades  Painful or persistently difficult swallowing  New shortness of breath  Fever of 100F or higher  Black, tarry-looking stools  For urgent or emergent issues, a gastroenterologist can be reached at any hour by calling [phone].   DIET:  We do recommend a small meal at first, but then you may proceed to your regular diet.  Drink plenty of fluids but you should avoid alcoholic  beverages for 24 hours.  ACTIVITY:  You should plan to take it easy for the rest of today and you should NOT DRIVE or use heavy machinery until tomorrow (because of the sedation medicines used during the test).    FOLLOW UP: Our staff will call the number listed on your records 48-72 hours following your procedure to check on you and address any questions or concerns that you may have regarding the information given to you following your procedure. If we do not reach you, we will leave a message.  We will attempt to reach you two times.  During this call, we will ask if you have developed any symptoms of COVID 19. If you develop any symptoms (ie: fever, flu-like symptoms, shortness of breath, cough etc.) before then, please call [phone].  If you test positive for Covid 19 in the 2 weeks post procedure, please call and report this information to [place].    If any biopsies were taken you will be contacted by phone or by letter within the next 1-3 weeks.  Please call us at [phone] if you have not heard about the biopsies in 3 weeks.    SIGNATURES/CONFIDENTIALITY: You and/or your care partner have signed paperwork which will be entered into your electronic medical record.  These signatures attest to the fact that that the information above on your After Visit Summary has been reviewed and is understood.  Full responsibility of the confidentiality of  this discharge information lies with you and/or your care-partner.

## 2019-05-09 NOTE — Progress Notes (Signed)
Called to room to assist during endoscopic procedure.  Patient ID and intended procedure confirmed with present staff. Received instructions for my participation in the procedure from the performing physician.  

## 2019-05-13 ENCOUNTER — Telehealth: Payer: Self-pay

## 2019-05-13 NOTE — Telephone Encounter (Signed)
Follow up call for pt after endoscopy, left message for pt to call if having pain, not eating and drinking ok,  having difficulty with usual activities, and checking to see if she has a fever and /or any s/s of covid 19 or any exposure to covid-19.

## 2019-05-13 NOTE — Telephone Encounter (Signed)
  Follow up Call-  Call back number 05/09/2019  Post procedure Call Back phone  # [phone]  Permission to leave phone message Yes     Patient questions:  Do you have a fever, pain , or abdominal swelling? No. Pain Score  0 *  Have you tolerated food without any problems? Yes.    Have you been able to return to your normal activities? Yes.    Do you have any questions about your discharge instructions: Diet   No. Medications  No. Follow up visit  No.  Do you have questions or concerns about your Care? No.  Actions: * If pain score is 4 or above: No action needed, pain <4.  Pt states "mild pain" (2) to left rib area but feels much better and swallowing and eating without difficulty.  1. Have you developed a fever since your procedure? no  2.   Have you had an respiratory symptoms (SOB or cough) since your procedure? no  3.   Have you tested positive for COVID 19 since your procedure no  4.   Have you had any family members/close contacts diagnosed with the COVID 19 since your procedure?  no   If yes to any of these questions please route to Joylene John, RN and Alphonsa Gin, Therapist, sports.

## 2019-05-14 ENCOUNTER — Ambulatory Visit: Payer: PRIVATE HEALTH INSURANCE | Admitting: Neurology

## 2019-05-15 ENCOUNTER — Ambulatory Visit (INDEPENDENT_AMBULATORY_CARE_PROVIDER_SITE_OTHER): Payer: PRIVATE HEALTH INSURANCE | Admitting: Neurology

## 2019-05-15 ENCOUNTER — Other Ambulatory Visit: Payer: Self-pay

## 2019-05-15 ENCOUNTER — Encounter: Payer: Self-pay | Admitting: Gastroenterology

## 2019-05-15 ENCOUNTER — Encounter: Payer: Self-pay | Admitting: Neurology

## 2019-05-15 ENCOUNTER — Encounter

## 2019-05-15 DIAGNOSIS — G35 Multiple sclerosis: Secondary | ICD-10-CM

## 2019-05-15 DIAGNOSIS — R9089 Other abnormal findings on diagnostic imaging of central nervous system: Secondary | ICD-10-CM | POA: Insufficient documentation

## 2019-05-15 DIAGNOSIS — M6281 Muscle weakness (generalized): Secondary | ICD-10-CM | POA: Insufficient documentation

## 2019-05-15 DIAGNOSIS — R413 Other amnesia: Secondary | ICD-10-CM

## 2019-05-15 NOTE — Progress Notes (Signed)
PATIENT: Christy Lowe DOB: 05-04-1981  Virtual Visit via video  I connected with [place] on 05/15/19 at  by video and verified that I am speaking with the correct person using two identifiers.   I discussed the limitations, risks, security and privacy concerns of performing an evaluation and management service by video and the availability of in person appointments. I also discussed with the patient that there may be a patient responsible charge related to this service. The patient expressed understanding and agreed to proceed.  HISTORICAL  Christy Lowe is a 38 years old left-handed female, seen in request by her primary care physician Dr. Tedra Senegal for evaluation of migraine headache, multiple sclerosis, initial evaluation is to virtual visit on May 15, 2019.  I have reviewed and summarized the referring note from the referring physician.  She recently moved from [place] to [place], was previously treated at [facility], [place], [place] [address], [phone]  She presented with sudden onset right arm and leg weakness, right facial weakness, word finding difficulties in 2014, was treated at [facility], per patient, MRI of the brain showed abnormality consistent with multiple sclerosis, she was seen by neurologist, positive lumbar puncture, I do not have the above record, she was treated with Solu-Medrol, dexamethasone per patient, her symptoms has much improved, she was also given prescription of Cymbalta, Topamax, but none of the long-term immunomodulation therapy, she has regularly repeat MRI of the brain over the years  She has 2 or 3 recurrent spells every year, presenting with sudden onset right-sided weakness, word finding difficulties, lasting for hours to days, also has right side paresthesia.  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  Awake, alert, mildly distressed, facial were symmetric, moving 4 extremities without difficulty, gait was steady  Assessment and Plan: Reported history of relapsing remitting multiple sclerosis,  Presented with right-sided weakness, word finding difficulties,  Repeat MRI of the brain with without contrast  Get medical record from previously treating hospital, [address] [place], [place] [address], [phone]  Follow Up Instructions:  In 4 weeks after MRI brain    I discussed the assessment and treatment plan with the patient. The patient was provided an opportunity to ask questions and all were answered. The patient agreed with the plan and demonstrated an understanding of the instructions.   The patient was advised to call back or seek an in-person evaluation if the symptoms worsen or if the condition fails to improve as anticipated.  I provided 30 minutes of non-face-to-face time during this encounter.  REVIEW OF SYSTEMS: Full 14 system review of systems performed and notable only for as above All other review of systems were negative.  ALLERGIES: Allergies  Allergen Reactions  . Cephalexin Hives and Shortness Of Breath  . Penicillins Hives and Anaphylaxis    SOB As  child   . Amitiza [Lubiprostone]   . Sulfa Antibiotics Other (See Comments)    As child Doesn't remember     HOME MEDICATIONS: Current Outpatient Medications   Medication Sig Dispense Refill  . aspirin 325 MG tablet Take 325 mg by mouth daily. Takes every day    . DULoxetine (CYMBALTA) 30 MG capsule Take 30 mg by mouth daily.    Marland Kitchen omeprazole (PRILOSEC) 40 MG capsule Take 1 capsule (40 mg total) by mouth daily. 30 minutes before breakfast 90 capsule 3  . sucralfate (CARAFATE) 1 g tablet Take 1 tablet (1 g total) by mouth 4 (four) times daily -  with meals and at bedtime. 120 tablet 2   Current Facility-Administered Medications  Medication Dose Route Frequency Provider Last Rate Last Dose  . 0.9 %  sodium chloride infusion  500 mL Intravenous Once Nandigam, Venia Minks, MD        PAST MEDICAL HISTORY: Past Medical History:  Diagnosis Date  . Hemangioma   . Hypotension   . MS (multiple sclerosis) ([place])     PAST SURGICAL HISTORY: Past Surgical History:  Procedure Laterality Date  . ANTERIOR CRUCIATE LIGAMENT REPAIR    . TONSILLECTOMY      FAMILY HISTORY: Family History  Problem Relation Age of Onset  . Prostate cancer Paternal Grandfather   . Breast cancer Other     SOCIAL HISTORY:   Social History   Socioeconomic History  . Marital status: Unknown    Spouse name: Not on file  . Number of children: Not on file  . Years of education: Not on file  . Highest education level: Not on file  Occupational History  . Not on file  Social Needs  . Financial resource strain: Not on file  . Food insecurity    Worry: Not on file    Inability: Not on file  . Transportation needs    Medical: Not on file    Non-medical: Not on file  Tobacco Use  . Smoking status: Never Smoker  . Smokeless tobacco: Never Used  Substance and Sexual Activity  . Alcohol use: Yes    Comment: once a week  . Drug use: Not Currently  . Sexual activity: Not on file  Lifestyle  . Physical activity    Days per week: Not on file    Minutes per session: Not on file  . Stress: Not on file  Relationships  . Social Herbalist on phone: Not on file     Gets together: Not on file    Attends religious service: Not on file    Active member of club or organization: Not on file    Attends meetings of clubs or organizations: Not on file    Relationship status: Not on file  . Intimate partner violence    Fear of current or ex partner: Not on file    Emotionally abused: Not on file    Physically abused: Not on file    Forced sexual activity: Not on file  Other Topics Concern  . Not on file  Social History Narrative  . Not on file    Marcial Pacas, M.D. Ph.D.  [facility] [address] [place], [place] [address] Ph: [phone] Fax: [phone]  CC: [name], MD

## 2019-05-17 ENCOUNTER — Encounter: Payer: Self-pay | Admitting: Internal Medicine

## 2019-05-17 ENCOUNTER — Telehealth: Payer: Self-pay | Admitting: Internal Medicine

## 2019-05-17 NOTE — Telephone Encounter (Signed)
Pt has remote hx of elevated d- dimer and hx of DVT. Needs referral to Hematology. Checked here recently for Factor 5 Leiden and was negative. [place].Now residing in [place]. Does not appear to have had work up for coagulopathy on what few records we have  other than notation of mild elevation of d-dimer in the past.

## 2019-05-21 ENCOUNTER — Telehealth: Payer: Self-pay | Admitting: Internal Medicine

## 2019-05-21 ENCOUNTER — Encounter: Payer: Self-pay | Admitting: Internal Medicine

## 2019-05-21 DIAGNOSIS — G35 Multiple sclerosis: Secondary | ICD-10-CM

## 2019-05-21 NOTE — Telephone Encounter (Signed)
Pt contacted me- has not heard back from Neurology office about an appointment for MRI of brain. It could be pending insurance approval. Suggest she call [facility] in am.

## 2019-05-22 ENCOUNTER — Telehealth: Payer: Self-pay | Admitting: Hematology

## 2019-05-22 NOTE — Telephone Encounter (Signed)
Do you need me to do something? I am confused

## 2019-05-22 NOTE — Addendum Note (Signed)
Addended by: Marcial Pacas on: 05/22/2019 08:29 AM   Modules accepted: Orders

## 2019-05-22 NOTE — Addendum Note (Signed)
Addended by: Marcial Pacas on: 05/22/2019 12:03 PM   Modules accepted: Orders

## 2019-05-22 NOTE — Telephone Encounter (Signed)
Please make sure that she is on schedule for MRI of brain w/wo

## 2019-05-22 NOTE — Addendum Note (Signed)
Addended by: Marcial Pacas on: 05/22/2019 12:05 PM   Modules accepted: Orders

## 2019-05-22 NOTE — Addendum Note (Signed)
Addended by: Marcial Pacas on: 05/22/2019 12:01 PM   Modules accepted: Orders

## 2019-05-22 NOTE — Telephone Encounter (Signed)
New hem referral received from Dr. Renold Genta for elevated d-dimer and hx of dvt. Ms. Brugger has been cld and scheduled to see Dr. Irene Limbo on 7/8 at 10am. Address and location to our facility has been provided to the pt.

## 2019-05-22 NOTE — Telephone Encounter (Signed)
I have reentered the order MRI of brain w/wo. Please start pre-authorization through our office.

## 2019-05-22 NOTE — Telephone Encounter (Signed)
Medical mutual pending with [employer]

## 2019-05-22 NOTE — Telephone Encounter (Signed)
In the patient chart it does not show that Dr. Krista Blue order the MRI it shows that Dr. Renold Genta order it. If Dr. Renold Genta order it I cannot do anything with that.

## 2019-05-26 ENCOUNTER — Telehealth: Payer: Self-pay | Admitting: Neurology

## 2019-05-26 NOTE — Telephone Encounter (Signed)
no to the covid-19 questions MR Brain w/wo contrast Dr. Krista Blue Medical Mutual Auth: [id number] (exp. 05/22/19 to 11/18/19). Patient is scheduled at [facility] for 06/03/19.

## 2019-06-03 ENCOUNTER — Other Ambulatory Visit: Payer: Self-pay

## 2019-06-03 ENCOUNTER — Ambulatory Visit (INDEPENDENT_AMBULATORY_CARE_PROVIDER_SITE_OTHER): Payer: PRIVATE HEALTH INSURANCE

## 2019-06-03 ENCOUNTER — Encounter: Payer: Self-pay | Admitting: Gastroenterology

## 2019-06-03 DIAGNOSIS — G35 Multiple sclerosis: Secondary | ICD-10-CM

## 2019-06-03 MED ORDER — GADOBENATE DIMEGLUMINE 529 MG/ML IV SOLN
17.0000 mL | Freq: Once | INTRAVENOUS | Status: AC | PRN
  Administered 2019-06-03: 12:00:00 17 mL via INTRAVENOUS

## 2019-06-03 NOTE — Progress Notes (Signed)
HEMATOLOGY/ONCOLOGY CONSULTATION NOTE  Date of Service: 06/04/2019  Patient Care Team: [name], MD as PCP - General (Internal Medicine)  CHIEF COMPLAINTS/PURPOSE OF CONSULTATION:  Mass of Left side of Neck  HISTORY OF PRESENTING ILLNESS:  Christy Lowe is a wonderful 38 y.o. female who has been referred to [place] by Dr. Tedra Senegal for evaluation and management of Mass of left side of neck. The pt reports that she is doing well overall.  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 Has EBV as well and the pt believes she was told the meningitis was prompted by EBV. Denies concern for encephalitis at the time. She then had pneumonia and bronchitis several times, prompting CT evaluation, and was apparently found to have lung nodules.  The pt notes that she had an arthoscopic right knee procedure when she was 38 years old in 2013 or 2014. Soon after the procedure she developed leg swelling in her right calf and waited several weeks before seeking medical attention. She decided to seek medical attention after she developed right facial numbness and right arm numbness. She was found to have a DVT in her RLE. She was also on PO contraceptives at the time. Pt began PO Eliquis and then developed migraines less than 6 months after beginning Eliquis and subsequently transitioned to 325mg  Aspirin. She had a history of migraines prior to beginning Eliquis. The pt notes that she had an elevated d-dimer four years ago.  She notes that she was also found to have MS and a stroke as well on an MRI at the time of her facial numbness evaluation. Pt does not remember where the stroke was. She notes that Neurology input at the time felt that her neurologic symptoms were related to [place] and began Solumedrol, Topiramate, Cymbalta and another medication the pt cannot recall. As part of her initial neurologic work up, she was found to have a PFO which has not been addressed. The pt  notes that 5-6 years ago, she had an additional MRI Brain and was told by one doctor that she had a stroke, and another doctor told her she didn't.  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 She was also seen to have a cyst in the center of the neck, new lung nodules, and questionable lymphadenopathy in her neck and notes that she decided not to follow up at a cancer center in [place]. She was recommended to have her hemangiomas monitored over time. She had a nodule removed from her esophagus in June. 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 Senses a feeling of food getting stuck while trying to swallow. Is following with speech pathology with barium swallow studies. Pt has history of stomach ulcers, takes Sucralafate and denies H.pylori infections.  The pt notes that her right face continues to be numb. She denies any weakness in her right face. The pt continues to have intermittent tingling in her right arm.   The pt notes that her right leg swells occasionally after sitting. She notes that she has had some progressive fatigue over the last two years. Feels that she is exhausted all the time even after having 8 hours of sleep. Had a sleep study 4 years ago without concern for sleep apnea.  Most recent lab results (04/04/19) of CBC is as  follows: all values are WNL.  On review of systems, pt reports chronic right facial numbness, intermittent right arm tingling, some difficulty swallowing, occasional right leg swelling, and denies mouth sores, CP, SOB, pain in calfs or thighs, and any other symptoms.   On PMHx the pt reports DVT in 2013/2014. On Social Hx the pt denies smoking cigarettes ever. 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 On Family Hx the pt denies blood clots. Paternal Grandmother with ALS. Maternal Grandmother with myelodysplasia. Paternal  and maternal grandfathers with strokes and heart attacks both after age 45.  MEDICAL HISTORY:  Past Medical History:  Diagnosis Date  . Hemangioma   . Hypotension   . MS (multiple sclerosis) ([place])     SURGICAL HISTORY: Past Surgical History:  Procedure Laterality Date  . ANTERIOR CRUCIATE LIGAMENT REPAIR    . TONSILLECTOMY      SOCIAL HISTORY: Social History   Socioeconomic History  . Marital status: Unknown    Spouse name: Not on file  . Number of children: Not on file  . Years of education: Not on file  . Highest education level: Not on file  Occupational History  . Not on file  Social Needs  . Financial resource strain: Not on file  . Food insecurity    Worry: Not on file    Inability: Not on file  . Transportation needs    Medical: Not on file    Non-medical: Not on file  Tobacco Use  . Smoking status: Never Smoker  . Smokeless tobacco: Never Used  Substance and Sexual Activity  . Alcohol use: Yes    Comment: once a week  . Drug use: Not Currently  . Sexual activity: Not on file  Lifestyle  . Physical activity    Days per week: Not on file    Minutes per session: Not on file  . Stress: Not on file  Relationships  . Social Herbalist on phone: Not on file    Gets together: Not on file    Attends religious service: Not on file    Active member of club or organization: Not on file    Attends meetings of clubs or organizations: Not on file    Relationship status: Not on file  . Intimate partner violence    Fear of current or ex partner: Not on file    Emotionally abused: Not on file    Physically abused: Not on file    Forced sexual activity: Not on file  Other Topics Concern  . Not on file  Social History Narrative  . Not on file    FAMILY HISTORY: Family History  Problem Relation Age of Onset  . Prostate cancer Paternal Grandfather   . Breast cancer Other     ALLERGIES:  is allergic to cephalexin; penicillins; amitiza  [lubiprostone]; and sulfa antibiotics.  MEDICATIONS:  Current Outpatient Medications  Medication Sig Dispense Refill  . aspirin 325 MG tablet Take 325 mg by mouth daily. Takes every day    . DULoxetine (CYMBALTA) 30 MG capsule Take 30 mg by mouth daily.    Marland Kitchen omeprazole (PRILOSEC) 40 MG capsule Take 1 capsule (40 mg total) by mouth daily. 30 minutes before breakfast 90 capsule 3  . sucralfate (CARAFATE) 1 g tablet Take 1 tablet (1 g total) by mouth 4 (four) times daily -  with meals and at bedtime. 120 tablet 2   Current Facility-Administered Medications  Medication Dose Route Frequency  Provider Last Rate Last Dose  . 0.9 %  sodium chloride infusion  500 mL Intravenous Once Nandigam, Venia Minks, MD        REVIEW OF SYSTEMS:    10 Point review of Systems was done is negative except as noted above.  PHYSICAL EXAMINATION:   . Vitals:   06/04/19 1012  BP: 123/71  Pulse: 70  Resp: 18  Temp: 98.3 F (36.8 C)  SpO2: 100%   Filed Weights   06/04/19 1012  Weight: 183 lb 3.2 oz (83.1 kg)   .Body mass index is 28.69 kg/m.  GENERAL:alert, in no acute distress and comfortable SKIN: no acute rashes, no significant lesions EYES: conjunctiva are pink and non-injected, sclera anicteric OROPHARYNX: MMM, no exudates, no oropharyngeal erythema or ulceration NECK: supple, no JVD LYMPH:  no palpable lymphadenopathy in the cervical, axillary or inguinal regions LUNGS: clear to auscultation b/l with normal respiratory effort HEART: regular rate & rhythm ABDOMEN:  normoactive bowel sounds , non tender, not distended. Extremity: no pedal edema PSYCH: alert & oriented x 3 with fluent speech NEURO: no focal motor/sensory deficits  LABORATORY DATA:  I have reviewed the data as listed  . CBC Latest Ref Rng & Units 04/04/2019  WBC 3.8 - 10.8 Thousand/uL 5.1  Hemoglobin 11.7 - 15.5 g/dL 12.9  Hematocrit 35.0 - 45.0 % 38.1  Platelets 140 - 400 Thousand/uL 263    . CMP Latest Ref Rng & Units  04/04/2019  Glucose 65 - 99 mg/dL 86  BUN 7 - 25 mg/dL 14  Creatinine 0.50 - 1.10 mg/dL 0.86  Sodium 135 - 146 mmol/L 137  Potassium 3.5 - 5.3 mmol/L 4.4  Chloride 98 - 110 mmol/L 104  CO2 20 - 32 mmol/L 26  Calcium 8.6 - 10.2 mg/dL 9.4  Total Protein 6.1 - 8.1 g/dL 7.0  Total Bilirubin 0.2 - 1.2 mg/dL 0.5  AST 10 - 30 U/L 16  ALT 6 - 29 U/L 10     RADIOGRAPHIC STUDIES: I have personally reviewed the radiological images as listed and agreed with the findings in the report. No results found.  ASSESSMENT & PLAN:  38 y.o. female with Multiple sclerosis and   1. History of DVT in RLE in 2013 or 2014 In setting of PO contraceptives and right knee procedure Given uncertain context of neurological symptoms with questionable inschemic events will send out hypercoagulable work up  2. History of liver hemangiomas 3. History of indeterminate pulmonary nodules 4. History of cervical lymphadenopathy  PLAN: -Discussed patient's most recent labs from 04/04/19, blood counts are normal -04/04/19 FVL negative -borderline palpable neck LN and fullness -will need interval study of pulmonary nodules to determine progression/etiology. -Will order CT Neck, Chest and A/P to evaluate neck LN and pulmonary and liver lesions -Will order hypercoagulable work up given the patient's possible history of stroke (she notes she was given different pronouncements by different physicians as to whether she had a stroke) -Will see the pt back in 2 weeks   Labs today CT neck/chest/abd in 1 week Telephone visit with Dr Irene Limbo in 2 weeks   All of the patients questions were answered with apparent satisfaction. The patient knows to call the clinic with any problems, questions or concerns.  The total time spent in the appt was 60 minutes and more than 50% was on counseling and direct patient cares.    Sullivan Lone MD [place] AAHIVMS [facility] Hematology/Oncology Physician Experiment  (Office):  [phone] (Work cell):  [phone] (Fax):           [phone]  06/04/2019 11:18 AM  I, Baldwin [place], am acting as a scribe for Dr. Sullivan Lone.   .I have reviewed the above documentation for accuracy and completeness, and I agree with the above. Brunetta Genera MD

## 2019-06-04 ENCOUNTER — Telehealth: Payer: Self-pay | Admitting: Hematology

## 2019-06-04 ENCOUNTER — Inpatient Hospital Stay: Payer: 59 | Attending: Hematology | Admitting: Hematology

## 2019-06-04 ENCOUNTER — Other Ambulatory Visit: Payer: Self-pay

## 2019-06-04 ENCOUNTER — Inpatient Hospital Stay: Payer: 59

## 2019-06-04 VITALS — BP 123/71 | HR 70 | Temp 98.3°F | Resp 18 | Ht 67.0 in | Wt 183.2 lb

## 2019-06-04 DIAGNOSIS — M7989 Other specified soft tissue disorders: Secondary | ICD-10-CM | POA: Diagnosis not present

## 2019-06-04 DIAGNOSIS — K769 Liver disease, unspecified: Secondary | ICD-10-CM | POA: Diagnosis not present

## 2019-06-04 DIAGNOSIS — D6859 Other primary thrombophilia: Secondary | ICD-10-CM

## 2019-06-04 DIAGNOSIS — R590 Localized enlarged lymph nodes: Secondary | ICD-10-CM

## 2019-06-04 DIAGNOSIS — Z803 Family history of malignant neoplasm of breast: Secondary | ICD-10-CM

## 2019-06-04 DIAGNOSIS — R1310 Dysphagia, unspecified: Secondary | ICD-10-CM

## 2019-06-04 DIAGNOSIS — R918 Other nonspecific abnormal finding of lung field: Secondary | ICD-10-CM | POA: Diagnosis not present

## 2019-06-04 DIAGNOSIS — Z86718 Personal history of other venous thrombosis and embolism: Secondary | ICD-10-CM | POA: Insufficient documentation

## 2019-06-04 DIAGNOSIS — D1809 Hemangioma of other sites: Secondary | ICD-10-CM

## 2019-06-04 DIAGNOSIS — R1319 Other dysphagia: Secondary | ICD-10-CM

## 2019-06-04 DIAGNOSIS — G35 Multiple sclerosis: Secondary | ICD-10-CM | POA: Diagnosis not present

## 2019-06-04 DIAGNOSIS — Z8042 Family history of malignant neoplasm of prostate: Secondary | ICD-10-CM | POA: Insufficient documentation

## 2019-06-04 DIAGNOSIS — R221 Localized swelling, mass and lump, neck: Secondary | ICD-10-CM | POA: Diagnosis not present

## 2019-06-04 DIAGNOSIS — K7689 Other specified diseases of liver: Secondary | ICD-10-CM

## 2019-06-04 DIAGNOSIS — Z8673 Personal history of transient ischemic attack (TIA), and cerebral infarction without residual deficits: Secondary | ICD-10-CM | POA: Diagnosis not present

## 2019-06-04 LAB — CBC WITH DIFFERENTIAL/PLATELET
Abs Immature Granulocytes: 0 10*3/uL (ref 0.00–0.07)
Basophils Absolute: 0 10*3/uL (ref 0.0–0.1)
Basophils Relative: 1 %
Eosinophils Absolute: 0.1 10*3/uL (ref 0.0–0.5)
Eosinophils Relative: 1 %
HCT: 40.4 % (ref 36.0–46.0)
Hemoglobin: 13.6 g/dL (ref 12.0–15.0)
Immature Granulocytes: 0 %
Lymphocytes Relative: 30 %
Lymphs Abs: 1.5 10*3/uL (ref 0.7–4.0)
MCH: 30.4 pg (ref 26.0–34.0)
MCHC: 33.7 g/dL (ref 30.0–36.0)
MCV: 90.4 fL (ref 80.0–100.0)
Monocytes Absolute: 0.4 10*3/uL (ref 0.1–1.0)
Monocytes Relative: 8 %
Neutro Abs: 3.1 10*3/uL (ref 1.7–7.7)
Neutrophils Relative %: 60 %
Platelets: 268 10*3/uL (ref 150–400)
RBC: 4.47 MIL/uL (ref 3.87–5.11)
RDW: 12.4 % (ref 11.5–15.5)
WBC: 5.1 10*3/uL (ref 4.0–10.5)
nRBC: 0 % (ref 0.0–0.2)

## 2019-06-04 LAB — CMP (CANCER CENTER ONLY)
ALT: 12 U/L (ref 0–44)
AST: 19 U/L (ref 15–41)
Albumin: 4.4 g/dL (ref 3.5–5.0)
Alkaline Phosphatase: 77 U/L (ref 38–126)
Anion gap: 8 (ref 5–15)
BUN: 13 mg/dL (ref 6–20)
CO2: 27 mmol/L (ref 22–32)
Calcium: 9.5 mg/dL (ref 8.9–10.3)
Chloride: 104 mmol/L (ref 98–111)
Creatinine: 0.91 mg/dL (ref 0.44–1.00)
GFR, Est AFR Am: >60 mL/min (ref >60)
GFR, Estimated: >60 mL/min (ref >60)
Glucose, Bld: 80 mg/dL (ref 70–99)
Potassium: 4.2 mmol/L (ref 3.5–5.1)
Sodium: 139 mmol/L (ref 135–145)
Total Bilirubin: 0.4 mg/dL (ref 0.3–1.2)
Total Protein: 8 g/dL (ref 6.5–8.1)

## 2019-06-04 LAB — D-DIMER, QUANTITATIVE: D-Dimer, Quant: 0.87 ug/mL-FEU — ABNORMAL HIGH (ref 0.00–0.50)

## 2019-06-04 LAB — FIBRINOGEN: Fibrinogen: 348 mg/dL (ref 210–475)

## 2019-06-04 LAB — ANTITHROMBIN III: AntiThromb III Func: 113 % (ref 75–120)

## 2019-06-04 NOTE — Telephone Encounter (Signed)
Scheduled appt per 7/8 los.   Spoke with patient and she is aware of her appt date and time.  Patient will be out of town next week.  She will contact central radiology to get her CT appt scheduled for when she come back.

## 2019-06-04 NOTE — Patient Instructions (Signed)
Thank you for choosing [facility] to provide your oncology and hematology care.   Should you have questions after your visit to the [facility] ([facility]), please contact this office at [phone] between 8:30 AM and 4:30 PM. Voicemails left after 4:00 PM may not be returned until the following business day. Calls received after 4:30 PM will be answered by an off-site Nurse Triage Line.    Prescription Refills:  Please have your pharmacy contact us directly for most prescription requests.  Contact the office directly for refills of narcotics (pain medications). Allow 48-72 hours for refills.  Appointments: Please contact the [facility] scheduling department [phone] for questions regarding [facility] appointment scheduling.  Contact the schedulers with any scheduling changes so that your appointment can be rescheduled in a timely manner.   Central Scheduling for [place] [phone] - Call to schedule procedures such as PET scans, CT scans, MRI, Ultrasound, etc.  To afford each patient quality time with our providers, please arrive 30 minutes before your scheduled appointment time.  If you arrive late for your appointment, you may be asked to reschedule.  We strive to give you quality time with our providers, and arriving late affects you and other patients whose appointments are after yours. If you are a no show for multiple scheduled visits, you may be dismissed from the clinic at the providers discretion.     Resources: [facility] Social Workers [phone] for additional information on assistance programs --Anne Cunningham/Abigail Elmore  [place] DSS  [phone]: Information regarding food stamps, Medicaid, and utility assistance [place] 336-333-6589   [place] [employer]'s shared-ride transportation service for eligible riders who have a disability that prevents them from riding the fixed route bus.   Medicare Rights Center 800-333-4114 Helps people with  Medicare understand their rights and benefits, navigate the Medicare system, and secure the quality healthcare they deserve [employer] 800-227-2345 Assists patients locate various types of support and financial assistance Cancer Care: 1-800-813-HOPE (4673) Provides financial assistance, online support groups, medication/co-pay assistance.      

## 2019-06-05 LAB — HOMOCYSTEINE: Homocysteine: 9.2 umol/L (ref 0.0–14.5)

## 2019-06-05 LAB — CARDIOLIPIN ANTIBODIES, IGG, IGM, IGA
Anticardiolipin IgA: <9 APL U/mL (ref 0–11)
Anticardiolipin IgG: <9 GPL U/mL (ref 0–14)
Anticardiolipin IgM: <9 MPL U/mL (ref 0–12)

## 2019-06-05 LAB — BETA-2-GLYCOPROTEIN I ABS, IGG/M/A
Beta-2 Glyco I IgG: <9 GPI IgG units (ref 0–20)
Beta-2-Glycoprotein I IgA: <9 GPI IgA units (ref 0–25)
Beta-2-Glycoprotein I IgM: <9 GPI IgM units (ref 0–32)

## 2019-06-06 LAB — MULTIPLE MYELOMA PANEL, SERUM
Albumin SerPl Elph-Mcnc: 4.3 g/dL (ref 2.9–4.4)
Albumin/Glob SerPl: 1.4 (ref 0.7–1.7)
Alpha 1: 0.2 g/dL (ref 0.0–0.4)
Alpha2 Glob SerPl Elph-Mcnc: 0.7 g/dL (ref 0.4–1.0)
B-Globulin SerPl Elph-Mcnc: 1 g/dL (ref 0.7–1.3)
Gamma Glob SerPl Elph-Mcnc: 1.2 g/dL (ref 0.4–1.8)
Globulin, Total: 3.1 g/dL (ref 2.2–3.9)
IgA: 92 mg/dL (ref 87–352)
IgG (Immunoglobin G), Serum: 1216 mg/dL (ref 586–1602)
IgM (Immunoglobulin M), Srm: 173 mg/dL (ref 26–217)
Total Protein ELP: 7.4 g/dL (ref 6.0–8.5)

## 2019-06-06 LAB — PROTEIN C, TOTAL: Protein C, Total: 143 % (ref 60–150)

## 2019-06-06 LAB — PROTEIN C ACTIVITY: Protein C Activity: 157 % (ref 73–180)

## 2019-06-06 LAB — LUPUS ANTICOAGULANT PANEL
DRVVT: 45.6 s (ref 0.0–47.0)
PTT Lupus Anticoagulant: 33.1 s (ref 0.0–51.9)

## 2019-06-06 LAB — PROTEIN S, TOTAL: Protein S Ag, Total: 89 % (ref 60–150)

## 2019-06-06 LAB — PROTEIN S ACTIVITY: Protein S Activity: 98 % (ref 63–140)

## 2019-06-09 ENCOUNTER — Telehealth: Payer: Self-pay | Admitting: Neurology

## 2019-06-09 ENCOUNTER — Telehealth: Payer: Self-pay | Admitting: *Deleted

## 2019-06-09 NOTE — Telephone Encounter (Signed)
Request made on today to [place]

## 2019-06-09 NOTE — Telephone Encounter (Signed)
Please call patient, MRI of the brain showed no significant abnormalities.  MRI brain (with and without) demonstrating: - Few (~3) punctate right hemispheric, subcortical, nonspecific T2 hyperintensities.  No abnormal lesions are seen on post contrast views.   - No acute findings.

## 2019-06-09 NOTE — Telephone Encounter (Signed)
The patient has a pending appt on 06/30/2019.  Dr. Krista Blue would like to review these records at her visit.   From Dr. Krista Blue: Get medical record from previously treating hospital, [facility], [place], [place] [address], [phone]

## 2019-06-09 NOTE — Telephone Encounter (Signed)
I have spoken with the patient and she is aware of her MRI results.  She has scheduled a follow up on 06/30/2019 to further review with Dr. Krista Blue.

## 2019-06-10 LAB — FACTOR 5 LEIDEN

## 2019-06-10 NOTE — Telephone Encounter (Signed)
Notes received and provided to Dr. Krista Blue for review.

## 2019-06-11 LAB — PROTHROMBIN GENE MUTATION

## 2019-06-16 ENCOUNTER — Other Ambulatory Visit: Payer: Self-pay

## 2019-06-16 ENCOUNTER — Encounter (HOSPITAL_COMMUNITY): Payer: Self-pay

## 2019-06-16 ENCOUNTER — Ambulatory Visit (HOSPITAL_COMMUNITY)
Admission: RE | Admit: 2019-06-16 | Discharge: 2019-06-16 | Disposition: A | Discharge status: Home / Self Care | Payer: 59 | Source: Ambulatory Visit | Attending: Hematology | Admitting: Hematology

## 2019-06-16 DIAGNOSIS — R221 Localized swelling, mass and lump, neck: Secondary | ICD-10-CM | POA: Diagnosis present

## 2019-06-16 DIAGNOSIS — K769 Liver disease, unspecified: Secondary | ICD-10-CM | POA: Insufficient documentation

## 2019-06-16 DIAGNOSIS — R1319 Other dysphagia: Secondary | ICD-10-CM

## 2019-06-16 DIAGNOSIS — R1310 Dysphagia, unspecified: Secondary | ICD-10-CM | POA: Diagnosis present

## 2019-06-16 DIAGNOSIS — R918 Other nonspecific abnormal finding of lung field: Secondary | ICD-10-CM | POA: Diagnosis present

## 2019-06-16 MED ORDER — IOHEXOL 300 MG/ML  SOLN
100.0000 mL | Freq: Once | INTRAMUSCULAR | Status: AC | PRN
  Administered 2019-06-16: 100 mL via INTRAVENOUS

## 2019-06-16 MED ORDER — SODIUM CHLORIDE (PF) 0.9 % IJ SOLN
INTRAMUSCULAR | Status: AC
  Filled 2019-06-16: qty 50

## 2019-06-17 ENCOUNTER — Telehealth: Payer: Self-pay | Admitting: Hematology

## 2019-06-17 NOTE — Telephone Encounter (Signed)
Confirmed appt/verified info °

## 2019-06-18 ENCOUNTER — Inpatient Hospital Stay (HOSPITAL_BASED_OUTPATIENT_CLINIC_OR_DEPARTMENT_OTHER): Payer: 59 | Admitting: Hematology

## 2019-06-18 DIAGNOSIS — D6859 Other primary thrombophilia: Secondary | ICD-10-CM

## 2019-06-18 DIAGNOSIS — R590 Localized enlarged lymph nodes: Secondary | ICD-10-CM

## 2019-06-18 NOTE — Progress Notes (Signed)
HEMATOLOGY/ONCOLOGY CONSULTATION NOTE  Date of Service: 06/18/2019  Patient Care Team: [name], MD as PCP - General (Internal Medicine)   CHIEF COMPLAINTS/PURPOSE OF CONSULTATION:  Mass of Left side of Neck   HISTORY OF PRESENTING ILLNESS:  Christy Lowe is a wonderful 38 y.o. female who has been referred to [place] by Dr. Tedra Senegal for evaluation and management of Mass of left side of neck. The pt reports that she is doing well overall.  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 Has EBV as well and the pt believes she was told the meningitis was prompted by EBV. Denies concern for encephalitis at the time. She then had pneumonia and bronchitis several times, prompting CT evaluation, and was apparently found to have lung nodules.  The pt notes that she had an arthoscopic right knee procedure when she was 38 years old in 2013 or 2014. Soon after the procedure she developed leg swelling in her right calf and waited several weeks before seeking medical attention. She decided to seek medical attention after she developed right facial numbness and right arm numbness. She was found to have a DVT in her RLE. She was also on PO contraceptives at the time. Pt began PO Eliquis and then developed migraines less than 6 months after beginning Eliquis and subsequently transitioned to 325mg  Aspirin. She had a history of migraines prior to beginning Eliquis. The pt notes that she had an elevated d-dimer four years ago.  She notes that she was also found to have MS and a stroke as well on an MRI at the time of her facial numbness evaluation. Pt does not remember where the stroke was. She notes that Neurology input at the time felt that her neurologic symptoms were related to [place] and began Solumedrol, Topiramate, Cymbalta and another medication the pt cannot recall. As part of her initial neurologic work up, she was found to have a PFO which has not been addressed. The  pt notes that 5-6 years ago, she had an additional MRI Brain and was told by one doctor that she had a stroke, and another doctor told her she didn't.  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 She was also seen to have a cyst in the center of the neck, new lung nodules, and questionable lymphadenopathy in her neck and notes that she decided not to follow up at a cancer center in [place]. She was recommended to have her hemangiomas monitored over time. She had a nodule removed from her esophagus in June. 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 Senses a feeling of food getting stuck while trying to swallow. Is following with speech pathology with barium swallow studies. Pt has history of stomach ulcers, takes Sucralafate and denies H.pylori infections.  The pt notes that her right face continues to be numb. She denies any weakness in her right face. The pt continues to have intermittent tingling in her right arm.   The pt notes that her right leg swells occasionally after sitting. She notes that she has had some progressive fatigue over the last two years. Feels that she is exhausted all the time even after having 8 hours of sleep. Had a sleep study 4 years ago without concern for sleep apnea.  Most recent lab results (04/04/19) of CBC  is as follows: all values are WNL.  On review of systems, pt reports chronic right facial numbness, intermittent right arm tingling, some difficulty swallowing, occasional right leg swelling, and denies mouth sores, CP, SOB, pain in calfs or thighs, and any other symptoms.   On PMHx the pt reports DVT in 2013/2014. On Social Hx the pt denies smoking cigarettes ever. 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 On Family Hx the pt denies blood clots. Paternal Grandmother with ALS. Maternal Grandmother with myelodysplasia.  Paternal and maternal grandfathers with strokes and heart attacks both after age 20.   INTERVAL HISTORY:  I connected with [place] on 06/18/19 at  2:40 PM EDT by telephone visit and verified that I am speaking with the correct person using two identifiers.   I discussed the limitations, risks, security and privacy concerns of performing an evaluation and management service by telemedicine and the availability of in-person appointments. I also discussed with the patient that there may be a patient responsible charge related to this service. The patient expressed understanding and agreed to proceed.   Other persons participating in the visit and their role in the encounter:   -Biomedical scientist, Medical Scribe   Patients location: home  Providers location: [place] is a 38 y.o. female here today for follow up and treatment of the mass in the left neck. The patient's last visit with [place] was on 06/04/2019. The pt reports that she is doing well overall.  The pt reports that she continues to have some issues with her stomach.   Of note since the patient's last visit, pt has had a brain MRI completed on 06/03/2019 with results revealing MRI brain (with and without) demonstrating: Few (~3) punctate right hemispheric, subcortical, nonspecific T2 hyperintensities.  No abnormal lesions are seen on post contrast views.No acute findings.  She also had a next CT with contrast completed on 06/16/2019 with results revealing Benign neck CT. Chronic left sphenoid sinusitis.  Finally, she had a chest and abdomen CT with contrast on 06/16/2019 with results revealing No acute findings are noted in the chest or abdomen to account for the patient's symptoms.  Lab results today (06/18/19) of CBC w/diff and CMP is as follows: all values are WNL. Homocysteine, serum results normal at 9.2 Fibrinogen results normal at 348.  MMP results all WNL.  Cardiolipin antibiodies  results IgG/M/A normal.  Prothrombin gene mutation negative.  Factor 5 Leiden negative.  Beta-2-glycoprotein IgG/M/A all WNL.  Lupus anticoagulant panel results all WNL.  Protein S total result at 89. Protein S activity result at 98. Protein C total result at 143. Protein C activity results at 157. Antithrombin III results at 113.  D-Dimer results abnormal at 0.87.   On review of systems, pt denies fevers, night sweats, and any other symptoms.    MEDICAL HISTORY:  Past Medical History:  Diagnosis Date   Hemangioma    Hypotension    MS (multiple sclerosis) (HCC)     SURGICAL HISTORY: Past Surgical History:  Procedure Laterality Date   ANTERIOR CRUCIATE LIGAMENT REPAIR     TONSILLECTOMY      SOCIAL HISTORY: Social History   Socioeconomic History   Marital status: Unknown    Spouse name: Not on file   Number of children: Not on file   Years of education: Not on file   Highest education level: Not on file  Occupational History   Not on  file  Social Needs   Financial resource strain: Not on file   Food insecurity    Worry: Not on file    Inability: Not on file   Transportation needs    Medical: Not on file    Non-medical: Not on file  Tobacco Use   Smoking status: Never Smoker   Smokeless tobacco: Never Used  Substance and Sexual Activity   Alcohol use: Yes    Comment: once a week   Drug use: Not Currently   Sexual activity: Not on file  Lifestyle   Physical activity    Days per week: Not on file    Minutes per session: Not on file   Stress: Not on file  Relationships   Social connections    Talks on phone: Not on file    Gets together: Not on file    Attends religious service: Not on file    Active member of club or organization: Not on file    Attends meetings of clubs or organizations: Not on file    Relationship status: Not on file   Intimate partner violence    Fear of current or ex partner: Not on file    Emotionally  abused: Not on file    Physically abused: Not on file    Forced sexual activity: Not on file  Other Topics Concern   Not on file  Social History Narrative   Not on file    FAMILY HISTORY: Family History  Problem Relation Age of Onset   Prostate cancer Paternal Grandfather    Breast cancer Other     ALLERGIES:  is allergic to cephalexin; penicillins; amitiza [lubiprostone]; and sulfa antibiotics.  MEDICATIONS:  Current Outpatient Medications  Medication Sig Dispense Refill   aspirin 325 MG tablet Take 325 mg by mouth daily. Takes every day     DULoxetine (CYMBALTA) 30 MG capsule Take 30 mg by mouth daily.     omeprazole (PRILOSEC) 40 MG capsule Take 1 capsule (40 mg total) by mouth daily. 30 minutes before breakfast 90 capsule 3   sucralfate (CARAFATE) 1 g tablet Take 1 tablet (1 g total) by mouth 4 (four) times daily -  with meals and at bedtime. 120 tablet 2   Current Facility-Administered Medications  Medication Dose Route Frequency Provider Last Rate Last Dose   0.9 %  sodium chloride infusion  500 mL Intravenous Once Nandigam, Venia Minks, MD        REVIEW OF SYSTEMS:   A 10+ POINT REVIEW OF SYSTEMS WAS OBTAINED including neurology, dermatology, psychiatry, cardiac, respiratory, lymph, extremities, GI, GU, Musculoskeletal, constitutional, breasts, reproductive, HEENT.  All pertinent positives are noted in the HPI.  All others are negative.    PHYSICAL EXAMINATION:   There were no vitals filed for this visit. There were no vitals filed for this visit. There is no height or weight on file to calculate BMI.  Telehealth Visit    LABORATORY DATA:  I have reviewed the data as listed  . CBC Latest Ref Rng & Units 06/04/2019 04/04/2019  WBC 4.0 - 10.5 K/uL 5.1 5.1  Hemoglobin 12.0 - 15.0 g/dL 13.6 12.9  Hematocrit 36.0 - 46.0 % 40.4 38.1  Platelets 150 - 400 K/uL 268 263    . CMP Latest Ref Rng & Units 06/04/2019 04/04/2019  Glucose 70 - 99 mg/dL 80 86  BUN 6 - 20  mg/dL 13 14  Creatinine 0.44 - 1.00 mg/dL 0.91 0.86  Sodium 135 - 145 mmol/L  139 137  Potassium 3.5 - 5.1 mmol/L 4.2 4.4  Chloride 98 - 111 mmol/L 104 104  CO2 22 - 32 mmol/L 27 26  Calcium 8.9 - 10.3 mg/dL 9.5 9.4  Total Protein 6.5 - 8.1 g/dL 8.0 7.0  Total Bilirubin 0.3 - 1.2 mg/dL 0.4 0.5  Alkaline Phos 38 - 126 U/L 77 -  AST 15 - 41 U/L 19 16  ALT 0 - 44 U/L 12 10     RADIOGRAPHIC STUDIES: I have personally reviewed the radiological images as listed and agreed with the findings in the report. Ct Soft Tissue Neck W Contrast  Result Date: 06/16/2019 CLINICAL DATA:  Neck fullness for 1 year EXAM: CT NECK WITH CONTRAST TECHNIQUE: Multidetector CT imaging of the neck was performed using the standard protocol following the bolus administration of intravenous contrast. CONTRAST:  143mL OMNIPAQUE IOHEXOL 300 MG/ML  SOLN COMPARISON:  None. FINDINGS: Pharynx and larynx: Normal. No mass or swelling. Salivary glands: No inflammation, mass, or stone. Thyroid: Normal. Lymph nodes: None enlarged or abnormal density. Vascular: Negative. Limited intracranial: Negative. Visualized orbits: Negative. Mastoids and visualized paranasal sinuses: Left maxillary sinus opacification with widened appearance of the ostium. Skeleton: No acute or aggressive process. Upper chest: Reported separately IMPRESSION: 1. Benign neck CT. 2. Chronic left sphenoid sinusitis. Electronically Signed   By: Monte Fantasia M.D.   On: 06/16/2019 09:33   Ct Chest W Contrast  Result Date: 06/16/2019 CLINICAL DATA:  38 year old female with history of left upper quadrant pain for the past 2 weeks. Left-sided neck fullness for 1 year. EXAM: CT CHEST AND ABDOMEN WITH CONTRAST TECHNIQUE: Multidetector CT imaging of the chest and abdomen was performed following the standard protocol during bolus administration of intravenous contrast. CONTRAST:  127mL OMNIPAQUE IOHEXOL 300 MG/ML  SOLN COMPARISON:  None. FINDINGS: CT CHEST FINDINGS  Cardiovascular: Heart size is normal. There is no significant pericardial fluid, thickening or pericardial calcification. No atherosclerotic calcifications in the thoracic aorta or the coronary arteries. Mediastinum/Nodes: No pathologically enlarged mediastinal or hilar lymph nodes. Esophagus is unremarkable in appearance. No axillary lymphadenopathy. Lungs/Pleura: No suspicious appearing pulmonary nodules or masses are noted. No acute consolidative airspace disease. No pleural effusions. Musculoskeletal: There are no aggressive appearing lytic or blastic lesions noted in the visualized portions of the skeleton. CT ABDOMEN FINDINGS Hepatobiliary: No suspicious cystic or solid hepatic lesions. No intra or extrahepatic biliary ductal dilatation. Gallbladder is normal in appearance. Pancreas: No pancreatic mass. No pancreatic ductal dilatation. No pancreatic or peripancreatic fluid collections or inflammatory changes. Spleen: Unremarkable. Adrenals/Urinary Tract: Bilateral kidneys and adrenal glands are normal in appearance. No hydroureteronephrosis in the visualized portions of the abdomen. Stomach/Bowel: Normal appearance of the stomach. No pathologic dilatation of visualized portions of small bowel or colon. Vascular/Lymphatic: No significant atherosclerotic disease, aneurysm or dissection noted in the abdominal vasculature. No lymphadenopathy noted in the abdomen. Other: No significant volume of ascites noted in the visualized portions of the abdomen. Musculoskeletal: There are no aggressive appearing lytic or blastic lesions noted in the visualized portions of the skeleton. IMPRESSION: 1. No acute findings are noted in the chest or abdomen to account for the patient's symptoms. Electronically Signed   By: Vinnie Langton M.D.   On: 06/16/2019 09:06   Ct Abdomen W Contrast  Result Date: 06/16/2019 CLINICAL DATA:  38 year old female with history of left upper quadrant pain for the past 2 weeks. Left-sided neck  fullness for 1 year. EXAM: CT CHEST AND ABDOMEN WITH CONTRAST TECHNIQUE: Multidetector CT  imaging of the chest and abdomen was performed following the standard protocol during bolus administration of intravenous contrast. CONTRAST:  160mL OMNIPAQUE IOHEXOL 300 MG/ML  SOLN COMPARISON:  None. FINDINGS: CT CHEST FINDINGS Cardiovascular: Heart size is normal. There is no significant pericardial fluid, thickening or pericardial calcification. No atherosclerotic calcifications in the thoracic aorta or the coronary arteries. Mediastinum/Nodes: No pathologically enlarged mediastinal or hilar lymph nodes. Esophagus is unremarkable in appearance. No axillary lymphadenopathy. Lungs/Pleura: No suspicious appearing pulmonary nodules or masses are noted. No acute consolidative airspace disease. No pleural effusions. Musculoskeletal: There are no aggressive appearing lytic or blastic lesions noted in the visualized portions of the skeleton. CT ABDOMEN FINDINGS Hepatobiliary: No suspicious cystic or solid hepatic lesions. No intra or extrahepatic biliary ductal dilatation. Gallbladder is normal in appearance. Pancreas: No pancreatic mass. No pancreatic ductal dilatation. No pancreatic or peripancreatic fluid collections or inflammatory changes. Spleen: Unremarkable. Adrenals/Urinary Tract: Bilateral kidneys and adrenal glands are normal in appearance. No hydroureteronephrosis in the visualized portions of the abdomen. Stomach/Bowel: Normal appearance of the stomach. No pathologic dilatation of visualized portions of small bowel or colon. Vascular/Lymphatic: No significant atherosclerotic disease, aneurysm or dissection noted in the abdominal vasculature. No lymphadenopathy noted in the abdomen. Other: No significant volume of ascites noted in the visualized portions of the abdomen. Musculoskeletal: There are no aggressive appearing lytic or blastic lesions noted in the visualized portions of the skeleton. IMPRESSION: 1. No acute  findings are noted in the chest or abdomen to account for the patient's symptoms. Electronically Signed   By: Vinnie Langton M.D.   On: 06/16/2019 09:06   Mr Jeri Cos VQ Contrast  Result Date: 06/05/2019 [facility] NEUROIMAGING REPORT STUDY DATE: 06/03/19 PATIENT NAME: Christy Lowe DOB: November 02, 1981 MRN: [id number] ORDERING CLINICIAN: Marcial Pacas, MD PhD CLINICAL HISTORY: 38 year old female with multiple sclerosis. EXAM: MRI brain (with and without) TECHNIQUE: MRI of the brain with and without contrast was obtained utilizing 5 mm axial slices with T1, T2, T2 flair, T2 star gradient echo and diffusion weighted views.  T1 sagittal, T2 coronal and postcontrast views in the axial and coronal plane were obtained. CONTRAST: 59ml multihance COMPARISON: none IMAGING SITE: [facility] 3rd [place] (1.5 Tesla MRI) FINDINGS: No abnormal lesions are seen on diffusion-weighted views to suggest acute ischemia. The cortical sulci, fissures and cisterns are normal in size and appearance. Lateral, third and fourth ventricle are normal in size and appearance.  Cavum septum pellucidum cavum vergae variant.  No extra-axial fluid collections are seen. No evidence of mass effect or midline shift.  Few (~3) punctate right hemispheric, subcortical, nonspecific T2 hyperintensities.  No abnormal lesions are seen on post contrast views.  On sagittal views the posterior fossa, pituitary gland and corpus callosum are unremarkable. No evidence of intracranial hemorrhage on gradient-echo views. The orbits and their contents, paranasal sinuses and calvarium are unremarkable.  Intracranial flow voids are present.   MRI brain (with and without) demonstrating: - Few (~3) punctate right hemispheric, subcortical, nonspecific T2 hyperintensities.  No abnormal lesions are seen on post contrast views.  - No acute findings. INTERPRETING PHYSICIAN: Penni Bombard, MD Certified in Neurology, Neurophysiology and  Neuroimaging [facility] [address] [place], [place] [address] [phone]    ASSESSMENT & PLAN:   38 y.o. female with h/o ?Multiple sclerosis and   1. History of DVT in RLE in 2013 or 2014 In setting of PO contraceptives and right knee procedure Given uncertain  context of neurological symptoms with questionable inschemic events will send out hypercoagulable work up  2. History of liver hemangiomas 3. History of indeterminate pulmonary nodules 4. History of cervical lymphadenopathy  PLAN: -Discussed pt labwork today, 06/04/19; all values are WNL. -Homocysteine, serum results normal at 9.2 -Fibrinogen results normal at 348.  -MMP results all WNL.  -Cardiolipin antibiodies results IgG/M/A normal.  -Prothrombin gene mutation negative.  -Factor 5 Leiden negative.  -Beta-2-glycoprotein IgG/M/A all WNL.  -Lupus anticoagulant panel results all WNL.  -Protein S total result at 89. -Protein S activity result at 98. -Protein C total result at 143. -Protein C activity results at 157. -Antithrombin III results at 113.  -D-Dimer results abnormal at 0.87.  -Discussed that she has no clear evidence of overt acquired or inherited hypercoagulable status CT C/A/P with no evidence of lymphadenopathy or liver lesions. -no other hematologic f/u indicated at this time. -Discussed follow up with PCP    All of the patients questions were answered with apparent satisfaction. The patient knows to call the clinic with any problems, questions or concerns.  The total time spent in the appt was 20 minutes and more than 50% was on counseling and direct patient cares.     Sullivan Lone MD MS AAHIVMS [facility] Hematology/Oncology Physician [facility]  (Office):       [phone] (Work cell):  [phone] (Fax):           [phone]  06/18/2019 3:25 PM  I, Jacqualyn Posey, am acting as a Education administrator for Dr. Sullivan Lone.   .I have reviewed the above  documentation for accuracy and completeness, and I agree with the above. Brunetta Genera MD

## 2019-06-19 ENCOUNTER — Telehealth: Payer: Self-pay | Admitting: Hematology

## 2019-06-19 NOTE — Telephone Encounter (Signed)
No los per 7/23. °

## 2019-06-25 NOTE — Progress Notes (Signed)
Thank you. This evaluation has been most helpful

## 2019-06-30 ENCOUNTER — Ambulatory Visit: Payer: PRIVATE HEALTH INSURANCE | Admitting: Neurology

## 2019-07-01 ENCOUNTER — Encounter: Payer: Self-pay | Admitting: Neurology

## 2019-07-02 NOTE — Progress Notes (Unsigned)
No show

## 2019-07-16 ENCOUNTER — Encounter: Payer: Self-pay | Admitting: Gastroenterology

## 2019-08-26 ENCOUNTER — Ambulatory Visit (INDEPENDENT_AMBULATORY_CARE_PROVIDER_SITE_OTHER): Payer: PRIVATE HEALTH INSURANCE | Admitting: Obstetrics and Gynecology

## 2019-08-26 ENCOUNTER — Encounter: Payer: Self-pay | Admitting: Obstetrics and Gynecology

## 2019-08-26 ENCOUNTER — Other Ambulatory Visit: Payer: Self-pay

## 2019-08-26 ENCOUNTER — Other Ambulatory Visit (HOSPITAL_COMMUNITY)
Admission: RE | Admit: 2019-08-26 | Discharge: 2019-08-26 | Disposition: A | Discharge status: Home / Self Care | Payer: 59 | Source: Ambulatory Visit | Attending: Obstetrics and Gynecology | Admitting: Obstetrics and Gynecology

## 2019-08-26 ENCOUNTER — Telehealth: Payer: Self-pay | Admitting: *Deleted

## 2019-08-26 VITALS — BP 110/78 | HR 76 | Temp 97.2°F | Ht 67.0 in | Wt 182.0 lb

## 2019-08-26 DIAGNOSIS — Z124 Encounter for screening for malignant neoplasm of cervix: Secondary | ICD-10-CM

## 2019-08-26 DIAGNOSIS — N6315 Unspecified lump in the right breast, overlapping quadrants: Secondary | ICD-10-CM

## 2019-08-26 DIAGNOSIS — Z8742 Personal history of other diseases of the female genital tract: Secondary | ICD-10-CM | POA: Insufficient documentation

## 2019-08-26 DIAGNOSIS — G43009 Migraine without aura, not intractable, without status migrainosus: Secondary | ICD-10-CM | POA: Insufficient documentation

## 2019-08-26 DIAGNOSIS — Z8619 Personal history of other infectious and parasitic diseases: Secondary | ICD-10-CM | POA: Diagnosis present

## 2019-08-26 DIAGNOSIS — Z86718 Personal history of other venous thrombosis and embolism: Secondary | ICD-10-CM | POA: Insufficient documentation

## 2019-08-26 DIAGNOSIS — G35 Multiple sclerosis: Secondary | ICD-10-CM

## 2019-08-26 DIAGNOSIS — N920 Excessive and frequent menstruation with regular cycle: Secondary | ICD-10-CM

## 2019-08-26 DIAGNOSIS — N946 Dysmenorrhea, unspecified: Secondary | ICD-10-CM

## 2019-08-26 DIAGNOSIS — Z01419 Encounter for gynecological examination (general) (routine) without abnormal findings: Secondary | ICD-10-CM

## 2019-08-26 DIAGNOSIS — N6310 Unspecified lump in the right breast, unspecified quadrant: Secondary | ICD-10-CM

## 2019-08-26 HISTORY — DX: Multiple sclerosis: G35

## 2019-08-26 NOTE — Progress Notes (Signed)
38 y.o. [id number] Single White or Caucasian Not Hispanic or Latino female here for annual exam. The patient has a h/o a DVT (not hypercoagulable), TIA, MS. No plans to have a child. Partner has had a vasectomy. No dyspareunia.     Period Cycle (Days): 28 Period Duration (Days): 3 days Period Pattern: Regular Menstrual Flow: Heavy Menstrual Control: Tampon, Thin pad Menstrual Control Change Freq (Hours): changes tampon every 2 hours for the first 2 days, changes every 5 hours after Dysmenorrhea: (!) Severe Dysmenorrhea Symptoms: Cramping  Takes ibuprofen for the cramps.  Not anemic.   Patient's last menstrual period was 08/13/2019 (exact date).          Sexually active: Yes.    The current method of family planning is partner with a vasectomy.    Exercising: Yes.    walking, gym, riding horses Smoker:  no  Health Maintenance: Pap:  2019 WNL, POS HPV 16 History of abnormal Pap:  Yes, colpo no biopsies, was due to repeat pap in 6 months and was unable to  MMG:  2017 done in [place] for lumps, imaging was normal. She still has the lumps BMD:   Never Colonoscopy: 2016 polyps. She is getting an upper GI 1 x a year for a  TDaP:  Unsure Gardasil: No   reports that she has never smoked. She has never used smokeless tobacco. She reports current alcohol use of about 2.0 standard drinks of alcohol per week. She reports that she does not use drugs. 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 Moved from [place] to [place], came here to be with her boyfriend in January of 2020. 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 Not liking working on line, it's awful.   Past Medical History:  Diagnosis Date  . Dysmenorrhea   . Hemangioma   . History of DVT (deep vein thrombosis)   . History of endometriosis   . Hypotension   . Migraine without aura   . MS (multiple sclerosis) ([place])   . Multiple  sclerosis ([place]) 08/26/2019  She has many hemangioma.   Past Surgical History:  Procedure Laterality Date  . ANTERIOR CRUCIATE LIGAMENT REPAIR    . PELVIC LAPAROSCOPY     endometriosis  . TONSILLECTOMY      Current Outpatient Medications  Medication Sig Dispense Refill  . aspirin 325 MG tablet Take 325 mg by mouth daily. Takes every day    . DULoxetine (CYMBALTA) 30 MG capsule Take 30 mg by mouth daily.    Marland Kitchen omeprazole (PRILOSEC) 40 MG capsule Take 1 capsule (40 mg total) by mouth daily. 30 minutes before breakfast 90 capsule 3  . sucralfate (CARAFATE) 1 g tablet Take 1 tablet (1 g total) by mouth 4 (four) times daily -  with meals and at bedtime. 120 tablet 2   Current Facility-Administered Medications  Medication Dose Route Frequency Provider Last Rate Last Dose  . 0.9 %  sodium chloride infusion  500 mL Intravenous Once Nandigam, Venia Minks, MD      Cymbalta for nerve pain.   Family History  Problem Relation Age of Onset  . Prostate cancer Paternal Grandfather   . Heart attack Paternal Grandfather   . Breast cancer Other   . Alcohol abuse Mother   . Diabetes Maternal Grandmother   . Cervical cancer Maternal Grandmother   . ALS Maternal Grandmother   . Prostate cancer Maternal Grandfather   .  Heart attack Maternal Grandfather     Review of Systems  Constitutional:       Lump in right breast  HENT: Negative.   Eyes: Negative.   Respiratory: Negative.   Cardiovascular: Negative.   Gastrointestinal: Negative.   Endocrine: Negative.   Genitourinary: Positive for menstrual problem.  Musculoskeletal: Negative.   Skin: Negative.   Allergic/Immunologic: Negative.   Neurological: Negative.   Hematological: Negative.   Psychiatric/Behavioral: Negative.     Exam:   BP 110/78 (BP Location: Right Arm, Patient Position: Sitting, Cuff Size: Normal)   Pulse 76   Temp (!) 97.2 F (36.2 C) (Skin)   Ht 5\' 7"  (1.702 m)   Wt 182 lb (82.6 kg)   LMP 08/13/2019 (Exact Date)   BMI  28.51 kg/m   Weight change: @WEIGHTCHANGE @ Height:   Height: 5\' 7"  (170.2 cm)  Ht Readings from Last 3 Encounters:  08/26/19 5\' 7"  (1.702 m)  06/04/19 5\' 7"  (1.702 m)  05/09/19 5\' 7"  (1.702 m)    General appearance: alert, cooperative and appears stated age Head: Normocephalic, without obvious abnormality, atraumatic Neck: no adenopathy, supple, symmetrical, trachea midline and thyroid normal to inspection and palpation Lungs: clear to auscultation bilaterally Cardiovascular: regular rate and rhythm Breasts: in the right breast, near the periphery at 3-4 o'clock is a pea sized tender lump. No skin changes, bilateral fibrocystic changes.  Abdomen: soft, non-tender; non distended,  no masses,  no organomegaly Extremities: extremities normal, atraumatic, no cyanosis or edema Skin: Skin color, texture, turgor normal. No rashes or lesions Lymph nodes: Cervical, supraclavicular, and axillary nodes normal. No abnormal inguinal nodes palpated Neurologic: Grossly normal   Pelvic: External genitalia:  no lesions              Urethra:  normal appearing urethra with no masses, tenderness or lesions              Bartholins and Skenes: normal                 Vagina: normal appearing vagina with normal color and discharge, no lesions              Cervix: no lesions               Bimanual Exam:  Uterus:  normal size, contour, position, consistency, mobility, non-tender              Adnexa: no mass, fullness, tenderness               Rectovaginal: Confirms               Anus:  normal sphincter tone, no lesions  Chaperone was present for exam.  A:  Well Woman with normal exam  H/O HPV 16 on pap last year  Breast lump (she feels it is slightly bigger than with prior imaging)  Menorrhagia, not anemic  Severe dysmenorrhea, tolerable  P:   Pap with hpv  Diagnostic breast imaging  Takes ibuprofen  Discussed the option of the mirena IUD, information given  Labs with other providers  Discussed  breast self exam  Discussed calcium and vit D intake

## 2019-08-26 NOTE — Telephone Encounter (Signed)
Spoke with Lilia Pro at [facility]. Patient will need to complete release of records for previous imaging in [place]. Will need this before patient can proceed with scheduling. Orders placed.   Call placed to patient. Advised as seen above. 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 Call ended by patient.

## 2019-08-26 NOTE — Telephone Encounter (Signed)
-----   Message from Salvadore Dom, MD sent at 08/26/2019  9:14 AM EDT ----- Please set her up for diagnostic breast imaging, lump in the right breast at 3-4 o'clock near the periphery.

## 2019-08-26 NOTE — Patient Instructions (Signed)
EXERCISE AND DIET:  We recommended that you start or continue a regular exercise program for good health. Regular exercise means any activity that makes your heart beat faster and makes you sweat.  We recommend exercising at least 30 minutes per day at least 3 days a week, preferably 4 or 5.  We also recommend a diet low in fat and sugar.  Inactivity, poor dietary choices and obesity can cause diabetes, heart attack, stroke, and kidney damage, among others.    ALCOHOL AND SMOKING:  Women should limit their alcohol intake to no more than 7 drinks/beers/glasses of wine (combined, not each!) per week. Moderation of alcohol intake to this level decreases your risk of breast cancer and liver damage. And of course, no recreational drugs are part of a healthy lifestyle.  And absolutely no smoking or even second hand smoke. Most people know smoking can cause heart and lung diseases, but did you know it also contributes to weakening of your bones? Aging of your skin?  Yellowing of your teeth and nails?  CALCIUM AND VITAMIN D:  Adequate intake of calcium and Vitamin D are recommended.  The recommendations for exact amounts of these supplements seem to change often, but generally speaking 1,000 mg of calcium (between diet and supplement) and 800 units of Vitamin D per day seems prudent. Certain women may benefit from higher intake of Vitamin D.  If you are among these women, your doctor will have told you during your visit.    PAP SMEARS:  Pap smears, to check for cervical cancer or precancers,  have traditionally been done yearly, although recent scientific advances have shown that most women can have pap smears less often.  However, every woman still should have a physical exam from her gynecologist every year. It will include a breast check, inspection of the vulva and vagina to check for abnormal growths or skin changes, a visual exam of the cervix, and then an exam to evaluate the size and shape of the uterus and  ovaries.  And after 36 years of age, a rectal exam is indicated to check for rectal cancers. We will also provide age appropriate advice regarding health maintenance, like when you should have certain vaccines, screening for sexually transmitted diseases, bone density testing, colonoscopy, mammograms, etc.   MAMMOGRAMS:  All women over 40 years old should have a yearly mammogram. Many facilities now offer a "3D" mammogram, which may cost around $50 extra out of pocket. If possible,  we recommend you accept the option to have the 3D mammogram performed.  It both reduces the number of women who will be called back for extra views which then turn out to be normal, and it is better than the routine mammogram at detecting truly abnormal areas.    COLON CANCER SCREENING: Now recommend starting at age 45. At this time colonoscopy is not covered for routine screening until 50. There are take home tests that can be done between 45-49.   COLONOSCOPY:  Colonoscopy to screen for colon cancer is recommended for all women at age 50.  We know, you hate the idea of the prep.  We agree, BUT, having colon cancer and not knowing it is worse!!  Colon cancer so often starts as a polyp that can be seen and removed at colonscopy, which can quite literally save your life!  And if your first colonoscopy is normal and you have no family history of colon cancer, most women don't have to have it again for   10 years.  Once every ten years, you can do something that may end up saving your life, right?  We will be happy to help you get it scheduled when you are ready.  Be sure to check your insurance coverage so you understand how much it will cost.  It may be covered as a preventative service at no cost, but you should check your particular policy.      Breast Self-Awareness Breast self-awareness means being familiar with how your breasts look and feel. It involves checking your breasts regularly and reporting any changes to your  health care provider. Practicing breast self-awareness is important. A change in your breasts can be a sign of a serious medical problem. Being familiar with how your breasts look and feel allows you to find any problems early, when treatment is more likely to be successful. All women should practice breast self-awareness, including women who have had breast implants. How to do a breast self-exam One way to learn what is normal for your breasts and whether your breasts are changing is to do a breast self-exam. To do a breast self-exam: Look for Changes  1. Remove all the clothing above your waist. 2. Stand in front of a mirror in a room with good lighting. 3. Put your hands on your hips. 4. Push your hands firmly downward. 5. Compare your breasts in the mirror. Look for differences between them (asymmetry), such as: ? Differences in shape. ? Differences in size. ? Puckers, dips, and bumps in one breast and not the other. 6. Look at each breast for changes in your skin, such as: ? Redness. ? Scaly areas. 7. Look for changes in your nipples, such as: ? Discharge. ? Bleeding. ? Dimpling. ? Redness. ? A change in position. Feel for Changes Carefully feel your breasts for lumps and changes. It is best to do this while lying on your back on the floor and again while sitting or standing in the shower or tub with soapy water on your skin. Feel each breast in the following way:  Place the arm on the side of the breast you are examining above your head.  Feel your breast with the other hand.  Start in the nipple area and make  inch (2 cm) overlapping circles to feel your breast. Use the pads of your three middle fingers to do this. Apply light pressure, then medium pressure, then firm pressure. The light pressure will allow you to feel the tissue closest to the skin. The medium pressure will allow you to feel the tissue that is a little deeper. The firm pressure will allow you to feel the tissue  close to the ribs.  Continue the overlapping circles, moving downward over the breast until you feel your ribs below your breast.  Move one finger-width toward the center of the body. Continue to use the  inch (2 cm) overlapping circles to feel your breast as you move slowly up toward your collarbone.  Continue the up and down exam using all three pressures until you reach your armpit.  Write Down What You Find  Write down what is normal for each breast and any changes that you find. Keep a written record with breast changes or normal findings for each breast. By writing this information down, you do not need to depend only on memory for size, tenderness, or location. Write down where you are in your menstrual cycle, if you are still menstruating. If you are having trouble noticing differences   in your breasts, do not get discouraged. With time you will become more familiar with the variations in your breasts and more comfortable with the exam. How often should I examine my breasts? Examine your breasts every month. If you are breastfeeding, the best time to examine your breasts is after a feeding or after using a breast pump. If you menstruate, the best time to examine your breasts is 5-7 days after your period is over. During your period, your breasts are lumpier, and it may be more difficult to notice changes. When should I see my health care provider? See your health care provider if you notice:  A change in shape or size of your breasts or nipples.  A change in the skin of your breast or nipples, such as a reddened or scaly area.  Unusual discharge from your nipples.  A lump or thick area that was not there before.  Pain in your breasts.  Anything that concerns you.  

## 2019-08-29 LAB — CYTOLOGY - PAP
Diagnosis: NEGATIVE
High risk HPV: NEGATIVE

## 2019-10-01 ENCOUNTER — Other Ambulatory Visit: Payer: Self-pay

## 2019-10-01 DIAGNOSIS — Z20822 Contact with and (suspected) exposure to covid-19: Secondary | ICD-10-CM

## 2019-10-02 LAB — NOVEL CORONAVIRUS, NAA: SARS-CoV-2, NAA: NOT DETECTED

## 2019-10-02 NOTE — Telephone Encounter (Signed)
Dx breast imaging not scheduled to date.   Patient needs to complete release of records for [facility] to obtain previous images from [place]. This form can be found on [facility] website.   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

## 2019-10-28 NOTE — Telephone Encounter (Signed)
Patient has not scheduled diagnotic breast imaging to date. Patient is aware of imaging recommendations, has not completed ROI for [facility] for previous imaging records for scheduling.  Patient has not returned call to further discuss.   Dr. Talbert Nan - please advise.

## 2019-10-28 NOTE — Telephone Encounter (Signed)
Please send her a letter recommending she complete the ROI and get the imaging and then close the encounter.

## 2019-10-29 NOTE — Telephone Encounter (Signed)
Letter pended

## 2019-10-30 NOTE — Telephone Encounter (Signed)
Yes, remove from mammogram hold.

## 2019-10-30 NOTE — Telephone Encounter (Signed)
Letter signed and mailed via [place] mail to address on file.   Dr. Talbert Nan -ok to remove from [facility] hold?

## 2019-11-03 NOTE — Telephone Encounter (Signed)
Patient removed from MMG hold.   Encounter closed.  

## 2019-12-15 ENCOUNTER — Ambulatory Visit: Payer: PRIVATE HEALTH INSURANCE | Attending: Internal Medicine

## 2019-12-15 DIAGNOSIS — Z20822 Contact with and (suspected) exposure to covid-19: Secondary | ICD-10-CM

## 2019-12-17 LAB — NOVEL CORONAVIRUS, NAA: SARS-CoV-2, NAA: NOT DETECTED

## 2019-12-22 ENCOUNTER — Telehealth: Payer: Self-pay | Admitting: Internal Medicine

## 2019-12-22 NOTE — Telephone Encounter (Signed)
[address] [name] called to see if she can get a note because of her medical conditions that she needs to continue to work from home. I let her know she may need office visit to discuss.

## 2019-12-22 NOTE — Telephone Encounter (Signed)
I do not think she has any medical conditions that qualify for this request. She does not have cancer or immunosuppression. By the way, her partner tested positive for Covid. Has she been tested and what was result? She also has new address we need to put in.

## 2019-12-23 ENCOUNTER — Ambulatory Visit (INDEPENDENT_AMBULATORY_CARE_PROVIDER_SITE_OTHER): Payer: 59 | Admitting: Internal Medicine

## 2019-12-23 ENCOUNTER — Encounter: Payer: Self-pay | Admitting: Internal Medicine

## 2019-12-23 VITALS — Ht 67.0 in | Wt 182.0 lb

## 2019-12-23 DIAGNOSIS — Z0289 Encounter for other administrative examinations: Secondary | ICD-10-CM

## 2019-12-23 DIAGNOSIS — Z20822 Contact with and (suspected) exposure to covid-19: Secondary | ICD-10-CM

## 2019-12-23 NOTE — Telephone Encounter (Signed)
Set up virtual visit . I have reviewed her records. Do not see COPD on Chest CT nor concrete findings of MS from Neurologist.

## 2019-12-23 NOTE — Telephone Encounter (Signed)
Scheduled Virtual Visit °

## 2019-12-23 NOTE — Progress Notes (Signed)
   Subjective:    Patient ID: Christy Lowe, female    DOB: [birth year], 39 y.o.   MRN: [id number]  HPI 39 year old Female seen today via interactive audio and video telecommunications due to coronavirus pandemic.  She is identified as Audiological scientist., using 2 identifiers, who is a patient in this practice.  Patient indicates that she has had recent exposure to COVID-19 after traveling to [place].  She resides with Alphonsa Overall and he has tested positive for COVID-19.  He has not been terribly ill but just has had respiratory infection symptoms with cough and congestion.  She is asymptomatic.  She tested negative for COVID-19 on January 18.  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  Has been taking virtually.  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  However she is alarmed about this as there will be some personnel in the school although students will be learning remotely.  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  She was asking about her medical diagnoses.  There is no evidence based on recent neurological evaluation that she has multiple sclerosis although the question was raised when she was living in [place].  She does not take immunosuppressant medication.  She was evaluated for hypercoagulable state and had extensive negative work-up by Dr. Lindi Adie.  Old records indicated there was possibly some neck mass but she had CT of neck in July which was negative.  MRI of the brain in July revealed no abnormal lesions suggestive of MS.  She had chest CT in July showing no evidence of lytic lesions, adenopathy, gallstones, kidney stones, pancreatic mass or adenopathy.   Review of Systems see above she is anxious about returning to work at this time    Objective:   Physical Exam  Seen virtually today in no acute distress sitting in her truck.  She is   anxious about returning to work and school setting with other individuals present although students will not be failure as they are learning remotely.      Assessment & Plan:  Anxiety  Recent COVID-19 exposure but tested negative  Plan: I have indicated to her that she does not have any medical conditions that would warrant concern about increased susceptibility to COVID-19 and is not on immunosuppressive medication.  We can merely ask in writing that she be allowed to work remotely from home.  It will be up to the school system to make that determination.

## 2019-12-23 NOTE — Telephone Encounter (Signed)
Called patient back to give her DR Baxley's response and she stated that she has been diagnosed with MS, COPD, and PFO and she thought these were conditions that would keep her working at home. She was tested for COVID and it was negative.

## 2019-12-23 NOTE — Patient Instructions (Signed)
Agreed I will prepare letter asking that she be allowed to work virtually from home

## 2019-12-24 ENCOUNTER — Encounter: Payer: Self-pay | Admitting: Internal Medicine

## 2020-03-10 ENCOUNTER — Emergency Department (HOSPITAL_COMMUNITY)
Admission: EM | Admit: 2020-03-10 | Discharge: 2020-03-10 | Disposition: A | Discharge status: Home / Self Care | Payer: 59 | Attending: Emergency Medicine | Admitting: Emergency Medicine

## 2020-03-10 ENCOUNTER — Telehealth: Payer: Self-pay | Admitting: Gastroenterology

## 2020-03-10 ENCOUNTER — Emergency Department (HOSPITAL_COMMUNITY): Payer: 59

## 2020-03-10 ENCOUNTER — Encounter (HOSPITAL_COMMUNITY): Payer: Self-pay

## 2020-03-10 ENCOUNTER — Other Ambulatory Visit: Payer: Self-pay

## 2020-03-10 DIAGNOSIS — R1013 Epigastric pain: Secondary | ICD-10-CM | POA: Diagnosis present

## 2020-03-10 DIAGNOSIS — G35 Multiple sclerosis: Secondary | ICD-10-CM | POA: Insufficient documentation

## 2020-03-10 DIAGNOSIS — Z7982 Long term (current) use of aspirin: Secondary | ICD-10-CM | POA: Diagnosis not present

## 2020-03-10 DIAGNOSIS — Z79899 Other long term (current) drug therapy: Secondary | ICD-10-CM | POA: Diagnosis not present

## 2020-03-10 DIAGNOSIS — R1110 Vomiting, unspecified: Secondary | ICD-10-CM

## 2020-03-10 DIAGNOSIS — R110 Nausea: Secondary | ICD-10-CM | POA: Insufficient documentation

## 2020-03-10 DIAGNOSIS — D6851 Activated protein C resistance: Secondary | ICD-10-CM | POA: Insufficient documentation

## 2020-03-10 DIAGNOSIS — R112 Nausea with vomiting, unspecified: Secondary | ICD-10-CM | POA: Diagnosis not present

## 2020-03-10 DIAGNOSIS — Z20822 Contact with and (suspected) exposure to covid-19: Secondary | ICD-10-CM | POA: Diagnosis not present

## 2020-03-10 DIAGNOSIS — R1111 Vomiting without nausea: Secondary | ICD-10-CM | POA: Insufficient documentation

## 2020-03-10 DIAGNOSIS — Z86718 Personal history of other venous thrombosis and embolism: Secondary | ICD-10-CM | POA: Insufficient documentation

## 2020-03-10 LAB — CBC
HCT: 43 % (ref 36.0–46.0)
Hemoglobin: 13.8 g/dL (ref 12.0–15.0)
MCH: 29.3 pg (ref 26.0–34.0)
MCHC: 32.1 g/dL (ref 30.0–36.0)
MCV: 91.3 fL (ref 80.0–100.0)
Platelets: 233 10*3/uL (ref 150–400)
RBC: 4.71 MIL/uL (ref 3.87–5.11)
RDW: 12.5 % (ref 11.5–15.5)
WBC: 4.4 10*3/uL (ref 4.0–10.5)
nRBC: 0 % (ref 0.0–0.2)

## 2020-03-10 LAB — COMPREHENSIVE METABOLIC PANEL
ALT: 16 U/L (ref 0–44)
AST: 20 U/L (ref 15–41)
Albumin: 4.2 g/dL (ref 3.5–5.0)
Alkaline Phosphatase: 64 U/L (ref 38–126)
Anion gap: 9 (ref 5–15)
BUN: 17 mg/dL (ref 6–20)
CO2: 25 mmol/L (ref 22–32)
Calcium: 8.6 mg/dL — ABNORMAL LOW (ref 8.9–10.3)
Chloride: 104 mmol/L (ref 98–111)
Creatinine, Ser: 0.85 mg/dL (ref 0.44–1.00)
GFR calc Af Amer: >60 mL/min (ref >60)
GFR calc non Af Amer: >60 mL/min (ref >60)
Glucose, Bld: 91 mg/dL (ref 70–99)
Potassium: 4 mmol/L (ref 3.5–5.1)
Sodium: 138 mmol/L (ref 135–145)
Total Bilirubin: 0.7 mg/dL (ref 0.3–1.2)
Total Protein: 7.3 g/dL (ref 6.5–8.1)

## 2020-03-10 LAB — URINALYSIS, ROUTINE W REFLEX MICROSCOPIC
Bilirubin Urine: NEGATIVE
Glucose, UA: NEGATIVE mg/dL
Hgb urine dipstick: NEGATIVE
Ketones, ur: 20 mg/dL — AB
Nitrite: NEGATIVE
Protein, ur: 30 mg/dL — AB
Specific Gravity, Urine: 1.026 (ref 1.005–1.030)
Squamous Epithelial / HPF: >50 — ABNORMAL HIGH (ref 0–5)
WBC, UA: >50 WBC/hpf — ABNORMAL HIGH (ref 0–5)
pH: 5 (ref 5.0–8.0)

## 2020-03-10 LAB — I-STAT BETA HCG BLOOD, ED (MC, WL, AP ONLY): I-stat hCG, quantitative: <5 m[IU]/mL (ref <5)

## 2020-03-10 LAB — LIPASE, BLOOD: Lipase: 22 U/L (ref 11–51)

## 2020-03-10 MED ORDER — LORAZEPAM 2 MG/ML IJ SOLN
0.5000 mg | Freq: Once | INTRAMUSCULAR | Status: AC
  Administered 2020-03-10: 0.5 mg via INTRAVENOUS
  Filled 2020-03-10: qty 1

## 2020-03-10 MED ORDER — FAMOTIDINE IN NACL 20-0.9 MG/50ML-% IV SOLN
20.0000 mg | Freq: Once | INTRAVENOUS | Status: AC
  Administered 2020-03-10: 12:00:00 20 mg via INTRAVENOUS
  Filled 2020-03-10: qty 50

## 2020-03-10 MED ORDER — SODIUM CHLORIDE 0.9% FLUSH: 3.0000 mL | Freq: Once | INTRAVENOUS | Status: DC

## 2020-03-10 MED ORDER — SODIUM CHLORIDE 0.9 % IV BOLUS
500.0000 mL | Freq: Once | INTRAVENOUS | Status: AC
  Administered 2020-03-10: 500 mL via INTRAVENOUS

## 2020-03-10 MED ORDER — SUCRALFATE 1 GM/10ML PO SUSP
1.0000 g | Freq: Once | ORAL | Status: AC
  Administered 2020-03-10: 12:00:00 1 g via ORAL
  Filled 2020-03-10: qty 10

## 2020-03-10 MED ORDER — PROCHLORPERAZINE EDISYLATE 10 MG/2ML IJ SOLN
10.0000 mg | Freq: Once | INTRAMUSCULAR | Status: AC
  Administered 2020-03-10: 12:00:00 10 mg via INTRAVENOUS
  Filled 2020-03-10: qty 2

## 2020-03-10 MED ORDER — ONDANSETRON 4 MG PO TBDP
4.0000 mg | ORAL_TABLET | Freq: Once | ORAL | Status: AC | PRN
  Administered 2020-03-10: 09:00:00 4 mg via ORAL
  Filled 2020-03-10: qty 1

## 2020-03-10 MED ORDER — FENTANYL CITRATE (PF) 100 MCG/2ML IJ SOLN: 25.0000 ug | Freq: Once | INTRAMUSCULAR | Status: DC

## 2020-03-10 MED ORDER — FENTANYL CITRATE (PF) 100 MCG/2ML IJ SOLN
12.5000 ug | Freq: Once | INTRAMUSCULAR | Status: AC
  Administered 2020-03-10: 12:00:00 12.5 ug via INTRAVENOUS
  Filled 2020-03-10: qty 2

## 2020-03-10 NOTE — Discharge Instructions (Addendum)
Follow-up with gastroenterology.  Hopefully scope tomorrow. Continue your prior medications.  Return for worsening pain, vomiting or fever.

## 2020-03-10 NOTE — ED Provider Notes (Signed)
[facility] DEPT Provider Note   CSN: [id number] Arrival date & time: 03/10/20  0818     History Chief Complaint  Patient presents with  . Emesis  . Abdominal Pain    Christy Lowe is a 39 y.o. female.  HPI    Patient presents with concern of epigastric pain, nausea, vomiting. She is here with her husband. She notes that she has a history of factor V Leiden, DVT, and though she does not have a known current DVT she takes aspirin for prophylaxis. She recently had back issues, and took several doses of NSAID, though these were coated. She notes that over the past day, after eating a meal consisting of meatballs about 44 hours ago she has had nausea, anorexia.  She had multiple episodes of vomiting in the hours after that meal, and since that time has been intolerant of oral intake including medication, fluids, solids. She has pain that is sharp, severe, focally in the epigastrium, with some radiation to her back, and some referred pain in her left outer breast with palpation of the anterior chest wall. No new dyspnea, no fever, no lower abdominal pain. She has been speaking with her gastroenterology team, and after interventions they advocated for were unsuccessful she presents for evaluation. Chart, her husband, patient all utilized for history. Past Medical History:  Diagnosis Date  . Dysmenorrhea   . Hemangioma   . History of DVT (deep vein thrombosis)   . History of endometriosis   . Hypotension   . Migraine without aura   . MS (multiple sclerosis) ([place])   . Multiple sclerosis ([place]) 08/26/2019    Patient Active Problem List   Diagnosis Date Noted  . Multiple sclerosis ([place]) 08/26/2019  . Migraine without aura   . History of DVT (deep vein thrombosis)   . History of endometriosis   . Right-sided muscle weakness 05/15/2019  . Abnormal finding on MRI of brain 05/15/2019  . 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 Mass of brain 12/24/2017    . Tremor 12/24/2017  . Numbness 12/24/2017  . Spinal cord disease ([place]) 12/24/2017  . Urinary incontinence 12/24/2017    Past Surgical History:  Procedure Laterality Date  . ANTERIOR CRUCIATE LIGAMENT REPAIR    . PELVIC LAPAROSCOPY     endometriosis  . TONSILLECTOMY       OB History    Gravida  0   Para  0   Term  0   Preterm  0   AB  0   Living  0     SAB  0   TAB  0   Ectopic  0   Multiple  0   Live Births  0           Family History  Problem Relation Age of Onset  . Prostate cancer Paternal Grandfather   . Heart attack Paternal Grandfather   . Breast cancer Other   . Alcohol abuse Mother   . Diabetes Maternal Grandmother   . Cervical cancer Maternal Grandmother   . ALS Maternal Grandmother   . Prostate cancer Maternal Grandfather   . Heart attack Maternal Grandfather     Social History   Tobacco Use  . Smoking status: Never Smoker  . Smokeless tobacco: Never Used  Substance Use Topics  . Alcohol use: Yes    Alcohol/week: 2.0 standard drinks    Types: 2 Standard drinks or equivalent per week  . Drug use: Never  Home Medications Prior to Admission medications   Medication Sig Start Date End Date Taking? Authorizing Provider  aspirin 325 MG tablet Take 325 mg by mouth daily. Takes every day    [provider]  DULoxetine (CYMBALTA) 30 MG capsule Take 30 mg by mouth daily.    [provider]  omeprazole (PRILOSEC) 40 MG capsule Take 1 capsule (40 mg total) by mouth daily. 30 minutes before breakfast 05/05/19   Nandigam, Venia Minks, MD  sucralfate (CARAFATE) 1 g tablet Take 1 tablet (1 g total) by mouth 4 (four) times daily -  with meals and at bedtime. 05/05/19   Mauri Pole, MD    Allergies    Cephalexin, Penicillins, Amitiza [lubiprostone], and Sulfa antibiotics  Review of Systems   Review of Systems  Constitutional:       Per HPI, otherwise negative  HENT:       Per HPI, otherwise negative  Respiratory:        Per HPI, otherwise negative  Cardiovascular:       Per HPI, otherwise negative  Gastrointestinal: Positive for abdominal pain, nausea and vomiting.  Endocrine:       Negative aside from HPI  Genitourinary:       Neg aside from HPI   Musculoskeletal:       Per HPI, otherwise negative  Skin: Negative.   Neurological: Negative for syncope.    Physical Exam Updated Vital Signs BP 119/85 (BP Location: Right Arm)   Pulse 78   Temp 98.3 F (36.8 C) (Oral)   Resp 16   Ht 5\' 7"  (1.702 m)   Wt 79.4 kg   LMP 02/25/2020   SpO2 100%   BMI 27.41 kg/m   Physical Exam Vitals and nursing note reviewed.  Constitutional:      General: She is not in acute distress.    Appearance: She is well-developed.  HENT:     Head: Normocephalic and atraumatic.  Eyes:     Conjunctiva/sclera: Conjunctivae normal.  Cardiovascular:     Rate and Rhythm: Normal rate and regular rhythm.  Pulmonary:     Effort: Pulmonary effort is normal. No respiratory distress.     Breath sounds: Normal breath sounds. No stridor.  Abdominal:     General: There is no distension.     Tenderness: There is abdominal tenderness in the epigastric area.  Skin:    General: Skin is warm and dry.  Neurological:     Mental Status: She is alert and oriented to person, place, and time.     Cranial Nerves: No cranial nerve deficit.     ED Results / Procedures / Treatments   Labs (all labs ordered are listed, but only abnormal results are displayed) Labs Reviewed  CBC  LIPASE, BLOOD  COMPREHENSIVE METABOLIC PANEL  URINALYSIS, ROUTINE W REFLEX MICROSCOPIC  I-STAT BETA HCG BLOOD, ED ([facility], [facility], [facility] ONLY)    EKG None  Radiology No results found.  Procedures Procedures (including critical care time)  Medications Ordered in ED Medications  sodium chloride flush (NS) 0.9 % injection 3 mL (has no administration in time range)  sucralfate (CARAFATE) 1 GM/10ML suspension 1 g (0 g Oral Hold 03/10/20 1133)  famotidine  (PEPCID) IVPB 20 mg premix (has no administration in time range)  sodium chloride 0.9 % bolus 500 mL (has no administration in time range)  fentaNYL (SUBLIMAZE) injection 12.5 mcg (has no administration in time range)  prochlorperazine (COMPAZINE) injection 10 mg (has no administration in  time range)  ondansetron (ZOFRAN-ODT) disintegrating tablet 4 mg (4 mg Oral Given 03/10/20 [id number])    ED Course  I have reviewed the triage vital signs and the nursing notes.  Pertinent labs & imaging results that were available during my care of the patient were reviewed by me and considered in my medical decision making (see chart for details).   Endoscopy notes from last year's study reviewed, results below: LA Grade B reflux esophagitis. Biopsied. - Benign-appearing esophageal stenosis. Dilated. - Small hiatal hernia.  3:29 PM After a delay, due to technology, I discussed her case with our GI colleagues, who will evaluate the patient.  This adult female with factor V Leiden, ulcerative disease presents with sternal and epigastric pain.  Patient is awake and alert, has no drop in hemoglobin, no hemodynamic instability, but does not improve in spite of IV medication provided in the emergency department.  Along with consideration of additional therapy versus endoscopy I discussed her case with our GI colleagues for evaluation. Patient's other findings are generally reassuring, no evidence for other infection, no evidence for likely pneumonia, peritonitis on physical exam.  Patient is appropriate for dispo per GI recommendations.  Dr. Roslynn Amble is aware of the patient.  Final Clinical Impression(s) / ED Diagnoses Final diagnoses:  Epigastric pain     Carmin Muskrat, MD 03/10/20 1530

## 2020-03-10 NOTE — Telephone Encounter (Addendum)
Spoke with the patient. She is tired, does not feel well and is having a hard time waiting. She is asking if [facility] can expedite her evaluation and treatment. Tried to explain the ED has a triage system in place and they will get her seen as quickly as possible. The ED provider can fully evaluate her to determine what tests will give the information needed to begin treatment. She may be given supportive care, sent home and told to follow up with [place]. Encouraged her to stay because she has not kept "anything down in a day and a half" and would benefit from medical intervention that we cannot do in the office. Also offered her an appointment here with the understanding we are unable to rehydrate her through IV in the office.  First available opening at this time is Friday. For now, she agrees to stay at the ED.

## 2020-03-10 NOTE — Telephone Encounter (Signed)
Christy Lowe would like this patient to have an outpatient EGD at our office tomorrow 03-11-2020. Dr Havery Moros has an opening at [facility]. If by chance he is willing she is requesting we call the patient directly  to confirm this.

## 2020-03-10 NOTE — Telephone Encounter (Signed)
Patient is calling states she spoke with Dr. Bryan Lemma about going to ED. She is currently there and is awaiting on orders to be placed into Epic.

## 2020-03-10 NOTE — Telephone Encounter (Signed)
Pt called and reported that she has been waiting in the ER for two hours.  Pt would like to know what she should do.

## 2020-03-10 NOTE — ED Triage Notes (Signed)
Patient states she has been having N/V and  Upper abdominal pain x 2 days. Patient states she has not been taking her aspirin 325 mg daily due to an elevated d dimer. Patient called her GI physician and was told to come to the ED.

## 2020-03-10 NOTE — ED Notes (Signed)
[place] MD awaiting to hear back from GI. Patient is requesting an update. MD made aware that patient is requesting an update

## 2020-03-10 NOTE — Telephone Encounter (Signed)
Thanks for update. We will see if the ED calls [place] to evaluate as she is not in the ED yet. Thanks. GM

## 2020-03-10 NOTE — Consult Note (Signed)
Referring Provider: No ref. provider found Primary Care Physician:  [name], MD Primary Gastroenterologist:  Dr. Silverio Decamp   Reason for Consultation:  Abdominal pain   HPI: Christy Lowe is a 39 y.o. 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  She has lower back pain, reported having a ruptured lumbar disc for which she saw an orthopedist last week who advised her to take Ibuprofen 850m po bid which she took for 3 to 4 days then developed epigastric pain with nausea and vomiting on Monday 03/08/2020. No hematemesis. She reported vomiting up partially digested food which consisted of dinner (meatballs and acorn squash) she ate the previous night. Her epigastric pain was intense and she felt soreness towards her left breast area. 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 She vomited up the biscuit and Carafate and Pepto bismol she took earlier in the day. She went home and rested. She drank water and took a bit more Pepto bismal and vomited up the water shortly after around 7 or 8pm. No further vomiting since then. Her epigastric pain has progressively worsened. She is having upper back pain between the shoulder blades in addition to her lower back pain.  She also takes ASA 3243monce daily due to having Leiden factor V deficiency. No other NSAIDs. She takes Nexium 4048mnce daily and she increases the Nexium to 17m61md when she feels her stomach is upset which occurs if she eats something spicy or if food just doesn't agree with her. She takes Carafate 1gm po once or twice daily as needed. She has occasional dysphagia which typically occurs if she eats meat. She had watery brown diarrhea x 1 yesterday. No melena or rectal bleeding. She typically passes a normal brown BM once every 3 to 4 days which is her usual bowel pattern. Her stool color is  darker brown if she goes 4 days without a BM. She drinks 1 glass of wine weekly or less. No drug use. No weight loss. No fever, sweats or chills. Her husband is present.   She has a significant Gi history. She was seen in our office by Dr. NandSilverio Decamp020 for evaluation for dysphagia.  Esophagram at that time showed a small hiatal hernia, moderate GERD, borderline mild peptic stricture with normal esophageal motility.  She underwent an EGD 05/09/2019 which showed grade B reflux esophagitis, a benign-appearing esophageal stenosis which was dilated, a small hiatal hernia, gastritis and erosive duodenal apathy.  Distal esophageal biopsies were consistent with chronic esophagitis with vascular congestion and squamous ballooning consistent with reflux esophagitis.  Proximal esophageal biopsies were also consistent with active chronic reflux esophagitis.  No evidence of Barrett's esophagus or dysplasia.  No evidence of H. pylori or celiac disease.  ED course: Sodium 138.  Potassium 4.0.  CO2 25.  Glucose 91.  BUN 17.  Creatinine 0.85.  Calcium 8.6.  Anion gap 9.  Alk phos 64.  Albumin 4.2.  Lipase 22.  AST 20.  ALT 16.  Total bili 0.7.  WBC 4.4.  Hemoglobin 13.8.  Hematocrit 43.0.  MCV 91.3. Platelet 233.  hCG < 5.0.  She received Ondansetron 4 mg ODT, Sucralfate 1 g p.o., Famotidine 20 mg IV, Compazine 10 mg IV, Fentanyl,  Lorazepam and IV fluids 500cc n the ED.   EGD 05/09/2019: - LA Grade B reflux  esophagitis. Biopsied. - Benign-appearing esophageal stenosis. Dilated. - Small hiatal hernia. - Gastritis. Biopsied. - Gastritis. Biopsied. - Erosive duodenopathy. Biopsied.  Esophagram 04/29/2019: 1. Small sliding hiatal hernia. Moderate gastroesophageal reflux elicited. 2. 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 No esophageal mass or ulcer detected. 3. Normal esophageal motility. 4. Normal oral and  pharyngeal phases of swallowing   Past Medical History:  Diagnosis Date  . Dysmenorrhea   . Hemangioma   . History of DVT (deep vein thrombosis)   . History of endometriosis   . Hypotension   . Migraine without aura   . MS (multiple sclerosis) ([place])   . Multiple sclerosis ([place]) 08/26/2019    Past Surgical History:  Procedure Laterality Date  . ANTERIOR CRUCIATE LIGAMENT REPAIR    . PELVIC LAPAROSCOPY     endometriosis  . TONSILLECTOMY      Prior to Admission medications   Medication Sig Start Date End Date Taking? Authorizing Provider  aspirin 325 MG tablet Take 325 mg by mouth daily. Takes every day   Yes [provider]  ibuprofen (ADVIL) 200 MG tablet Take 600 mg by mouth every 6 (six) hours as needed for fever or moderate pain.   Yes [provider]  omeprazole (PRILOSEC) 40 MG capsule Take 1 capsule (40 mg total) by mouth daily. 30 minutes before breakfast 05/05/19  Yes Nandigam, Venia Minks, MD  sucralfate (CARAFATE) 1 g tablet Take 1 tablet (1 g total) by mouth 4 (four) times daily -  with meals and at bedtime. 05/05/19  Yes Mauri Pole, MD    Current Facility-Administered Medications  Medication Dose Route Frequency Provider Last Rate Last Admin  . 0.9 %  sodium chloride infusion  500 mL Intravenous Once Nandigam, Kavitha V, MD      . fentaNYL (SUBLIMAZE) injection 25 mcg  25 mcg Intravenous Once Carmin Muskrat, MD      . LORazepam (ATIVAN) injection 0.5 mg  0.5 mg Intravenous Once Carmin Muskrat, MD      . sodium chloride flush (NS) 0.9 % injection 3 mL  3 mL Intravenous Once Carmin Muskrat, MD       Current Outpatient Medications  Medication Sig Dispense Refill  . aspirin 325 MG tablet Take 325 mg by mouth daily. Takes every day    . ibuprofen (ADVIL) 200 MG tablet Take 600 mg by mouth every 6 (six) hours as needed for fever or moderate pain.    Marland Kitchen omeprazole (PRILOSEC) 40 MG capsule Take 1 capsule (40 mg total) by mouth daily. 30 minutes  before breakfast 90 capsule 3  . sucralfate (CARAFATE) 1 g tablet Take 1 tablet (1 g total) by mouth 4 (four) times daily -  with meals and at bedtime. 120 tablet 2    Allergies as of 03/10/2020 - Review Complete 03/10/2020  Allergen Reaction Noted  . Cephalexin Hives and Shortness Of Breath 06/23/2014  . Penicillins Hives and Anaphylaxis 06/23/2014  . Amitiza [lubiprostone]  01/13/2019  . Sulfa antibiotics Other (See Comments) 06/23/2014    Family History  Problem Relation Age of Onset  . Prostate cancer Paternal Grandfather   . Heart attack Paternal Grandfather   . Breast cancer Other   . Alcohol abuse Mother   . Diabetes Maternal Grandmother   . Cervical cancer Maternal Grandmother   . ALS Maternal Grandmother   . Prostate cancer Maternal Grandfather   . Heart attack Maternal Grandfather  Social History   Socioeconomic History  . Marital status: Single    Spouse name: Not on file  . Number of children: Not on file  . Years of education: Not on file  . Highest education level: Not on file  Occupational History  . Not on file  Tobacco Use  . Smoking status: Never Smoker  . Smokeless tobacco: Never Used  Substance and Sexual Activity  . Alcohol use: Yes    Alcohol/week: 2.0 standard drinks    Types: 2 Standard drinks or equivalent per week  . Drug use: Never  . Sexual activity: Yes    Birth control/protection: Surgical    Comment: partner with vasectomy  Other Topics Concern  . Not on file  Social History Narrative  . Not on file   Social Determinants of Health   Financial Resource Strain:   . Difficulty of Paying Living Expenses:   Food Insecurity:   . Worried About Charity fundraiser in the Last Year:   . Arboriculturist in the Last Year:   Transportation Needs:   . Film/video editor (Medical):   Mar[place] Kitchen Lack of Transportation (Non-Medical):   Physical Activity:   . Days of Exercise per Week:   . Minutes of Exercise per Session:   Stress:   .  Feeling of Stress :   Social Connections:   . Frequency of Communication with Friends and Family:   . Frequency of Social Gatherings with Friends and Family:   . Attends Religious Services:   . Active Member of Clubs or Organizations:   . Attends Archivist Meetings:   Mar[place] Kitchen Marital Status:   Intimate Partner Violence:   . Fear of Current or Ex-Partner:   . Emotionally Abused:   [place] Kitchen Physically Abused:   . Sexually Abused:     Review of Systems:  See HPI, all other systems reviewed and are negative   Physical Exam:  Vital signs in last 24 hours: Temp:  [98.3 F (36.8 C)] 98.3 F (36.8 C) (04/14 0845) Pulse Rate:  [51-88] 66 (04/14 1330) Resp:  [16-18] 16 (04/14 1330) BP: (95-120)/(68-85) 111/79 (04/14 1330) SpO2:  [95 %-100 %] 100 % (04/14 1330) Weight:  [79.4 kg] 79.4 kg (04/14 0846)   General:  Alert,  well-developed, well-nourished, pleasant and cooperative in NAD. Head:  Normocephalic and atraumatic. Eyes:  No scleral icterus. Conjunctiva pink. Ears:  Normal auditory acuity. Nose:  No deformity, discharge or lesions. Mouth:  Dentition intact. No ulcers or lesions.  Neck:  Supple. No lymphadenopathy or thyromegaly.  Lungs: Clear throughout.  Heart:  RRR, no murmur.  Abdomen:  LUQ and epigastric tenderness without rebound or guarding. No HSM. + BS x 4 quadrants.    Rectal: Deferred. Musculoskeletal:  Symmetrical without gross deformities.  Pulses:  Normal pulses noted. Extremities:  Without clubbing or edema. Neurologic:  Alert and  oriented x4. No focal deficits.  Skin:  Intact without significant lesions or rashes. Psych:  Alert and cooperative. Normal mood and affect.  Intake/Output from previous day: No intake/output data recorded. Intake/Output this shift: Total I/O In: 50 [IV Piggyback:50] Out: -   Lab Results: Recent Labs    03/10/20 1117  WBC 4.4  HGB 13.8  HCT 43.0  PLT 233   BMET Recent Labs    03/10/20 1117  NA 138  K 4.0  CL 104    CO2 25  GLUCOSE 91  BUN 17  CREATININE 0.85  CALCIUM 8.6*  LFT Recent Labs    03/10/20 1117  PROT 7.3  ALBUMIN 4.2  AST 20  ALT 16  ALKPHOS 64  BILITOT 0.7   PT/INR No results for input(s): LABPROT, INR in the last 72 hours. Hepatitis Panel No results for input(s): HEPBSAG, HCVAB, HEPAIGM, HEPBIGM in the last 72 hours.    Studies/Results: No results found.  IMPRESSION/PLAN:  41.  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  Lipase and LFTs normal.  CBC normal.  -Stat RUQ sonogram to rule out gallstones -Zofran 27m ODT Q 6 to 8 hrs PRN -Push fluids -If abdominal sonogram normal and if the patient demonstrates keeping fluids down and abdominal pain is controlled then ok to discharge home. Plan for EGD as an outpatient within the next day or two if she is discharged home. Our office will contact patient with EGD arrangements.  -Advise No further Ibuprofen. Ok to continue ASA due to Leiden V factor deficiency -PPI po bid -Hold carafate if EGD done tomorrow.   2. History of MS  Further recommendations per Dr. 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  She has lower back pain, reported having a ruptured lumbar disc for which she saw an  CNoralyn Pick 03/10/2020, 3:23 PM

## 2020-03-10 NOTE — Telephone Encounter (Signed)
Received page to the on-call with c/o inability to tolerate p.o. for the last 2+ days with associated nausea/vomiting.  Not food impaction.  History of factor V Leiden and takes ASA 325 mg/day but has also been taking NSAIDs recently for arthropathy.  Previous history of granular cell tumor of the esophagus s/p removal 2011.  Was seen by Dr. Silverio Decamp in 04/2019 for evaluation of dysphagia.  Esophagram at that time with small hiatal hernia, moderate gastroesophageal reflux, borderline mild peptic stricture with normal esophageal motility.  EGD 04/2019 with mild stricturing lower esophagus, dilated with TTS 20 mm balloon.  EGD also with small hiatal hernia, mild gastritis and duodenal erosions.  States latest symptoms started 2+ days ago, has not been able to tolerate food or liquids.  Has nausea/vomiting proxy 1 hour after eating or drinking anything.  No chest pain and not necessarily dysphagia.  Had nausea/vomiting after trialing Carafate yesterday.    Discussed options to include conservative management at home with liquid diet, high-dose PPI (has Nexium at home), expedited outpatient appointment with potential expedited outpatient endoscopic evaluation, but she opted for heading to [facility] ER instead for evaluation, IVF, IV medications as needed, along with the potential for endoscopic evaluation as indicated.  Stopped NSAIDs.  All questions answered and appreciative of call back.

## 2020-03-10 NOTE — ED Provider Notes (Signed)
Signout note  39 year old lady presenting to ER with epigastric pain, nausea and vomiting.  GI consulted.  Plan to disposition pending GI consult.  GI rec RUQ [place] and then likely dc to f/u tomorrow for out pt endoscopy  RUQ US performed, rec pt stay for results, pt does not want to stay for results, she has been schedule for a scope tomorrow, well appearing, ambulating without difficulty out of department   Lucrezia Starch, MD 03/10/20 [phone]

## 2020-03-11 ENCOUNTER — Encounter: Payer: Self-pay | Admitting: Gastroenterology

## 2020-03-11 ENCOUNTER — Ambulatory Visit (AMBULATORY_SURGERY_CENTER): Payer: 59 | Admitting: Gastroenterology

## 2020-03-11 VITALS — BP 87/47 | HR 55 | Temp 97.1°F | Resp 15 | Ht 67.0 in | Wt 182.0 lb

## 2020-03-11 DIAGNOSIS — K2210 Ulcer of esophagus without bleeding: Secondary | ICD-10-CM

## 2020-03-11 DIAGNOSIS — B3781 Candidal esophagitis: Secondary | ICD-10-CM

## 2020-03-11 DIAGNOSIS — K449 Diaphragmatic hernia without obstruction or gangrene: Secondary | ICD-10-CM

## 2020-03-11 DIAGNOSIS — K219 Gastro-esophageal reflux disease without esophagitis: Secondary | ICD-10-CM

## 2020-03-11 DIAGNOSIS — K229 Disease of esophagus, unspecified: Secondary | ICD-10-CM

## 2020-03-11 DIAGNOSIS — R1310 Dysphagia, unspecified: Secondary | ICD-10-CM

## 2020-03-11 DIAGNOSIS — K317 Polyp of stomach and duodenum: Secondary | ICD-10-CM

## 2020-03-11 DIAGNOSIS — G8929 Other chronic pain: Secondary | ICD-10-CM

## 2020-03-11 DIAGNOSIS — K2289 Other specified disease of esophagus: Secondary | ICD-10-CM

## 2020-03-11 LAB — SARS CORONAVIRUS 2 (TAT 6-24 HRS): SARS Coronavirus 2: NEGATIVE

## 2020-03-11 MED ORDER — FLUCONAZOLE 100 MG PO TABS: 200.0000 mg | ORAL_TABLET | Freq: Every day | ORAL | 0 refills | Status: AC

## 2020-03-11 MED ORDER — METOCLOPRAMIDE HCL 5 MG PO TABS: 5.0000 mg | ORAL_TABLET | Freq: Three times a day (TID) | ORAL | 0 refills | Status: DC

## 2020-03-11 MED ORDER — FLUCONAZOLE 100 MG PO TABS: 400.0000 mg | ORAL_TABLET | Freq: Once | ORAL | 0 refills | Status: AC

## 2020-03-11 MED ORDER — SUCRALFATE 1 GM/10ML PO SUSP: 1.0000 g | Freq: Four times a day (QID) | ORAL | 1 refills | Status: DC

## 2020-03-11 MED ORDER — FLUCONAZOLE 50 MG PO TABS: 400.0000 mg | ORAL_TABLET | Freq: Once | ORAL | Status: DC

## 2020-03-11 MED ORDER — FLUCONAZOLE 50 MG PO TABS: 200.0000 mg | ORAL_TABLET | Freq: Every day | ORAL | Status: DC

## 2020-03-11 MED ORDER — SODIUM CHLORIDE 0.9 % IV SOLN: 500.0000 mL | Freq: Once | INTRAVENOUS | Status: DC

## 2020-03-11 MED ORDER — OMEPRAZOLE 40 MG PO CPDR: 40.0000 mg | DELAYED_RELEASE_CAPSULE | Freq: Every day | ORAL | 3 refills | Status: DC

## 2020-03-11 NOTE — Telephone Encounter (Addendum)
Called and confirmed with patient that she will come today for EGD with Dr Havery Moros who approved add on to his schedule for today. Her insurance was contacted and no precert was required.  Benefits are as followed: Deductible:1000 remaining 829 out of pocket- 5000 remaining 5000 pu 80%

## 2020-03-11 NOTE — Progress Notes (Signed)
Called to room to assist during endoscopic procedure.  Patient ID and intended procedure confirmed with present staff. Received instructions for my participation in the procedure from the performing physician.  

## 2020-03-11 NOTE — Progress Notes (Signed)
To PACU, VSS. Report to [email or web address] 

## 2020-03-11 NOTE — Op Note (Signed)
[place] Patient Name: Christy Lowe Procedure Date: 03/11/2020 10:37 AM MRN: [id number] Endoscopist: Remo Lipps P. Havery Moros , MD Age: 39 Referring MD:  Date of Birth: 08-Oct-1981 Gender: Female Account #: [id number] Procedure:                Upper GI endoscopy Indications:              Epigastric abdominal pain, Follow-up of                            gastro-esophageal reflux disease, early satiety,                            dysphagia - on nexium 40mg  daily at home + carafate                            with persistent reflux symptoms, developed                            epigastric pain radiating to back and substernal /                            chest burning seen in ED yesterday, [place] without                            clear cause, LFTs normal Medicines:                Monitored Anesthesia Care Procedure:                Pre-Anesthesia Assessment:                           - Prior to the procedure, a History and Physical                            was performed, and patient medications and                            allergies were reviewed. The patient's tolerance of                            previous anesthesia was also reviewed. The risks                            and benefits of the procedure and the sedation                            options and risks were discussed with the patient.                            All questions were answered, and informed consent                            was obtained. Prior Anticoagulants: The patient has  taken no previous anticoagulant or antiplatelet                            agents. ASA Grade Assessment: II - A patient with                            mild systemic disease. After reviewing the risks                            and benefits, the patient was deemed in                            satisfactory condition to undergo the procedure.                           After obtaining informed consent, the  endoscope was                            passed under direct vision. Throughout the                            procedure, the patient's blood pressure, pulse, and                            oxygen saturations were monitored continuously. The                            Endoscope was introduced through the mouth, and                            advanced to the second part of duodenum. The upper                            GI endoscopy was accomplished without difficulty.                            The patient tolerated the procedure well. Scope In: Scope Out: Findings:                 Esophagogastric landmarks were identified: the                            Z-line was found at 34 cm, the gastroesophageal                            junction was found at 34 cm and the upper extent of                            the gastric folds was found at 36 cm from the                            incisors.  A 2 cm hiatal hernia was present.                           Multiple small white plaques were found in the mid                            and lower esophagus consistent with mild esophageal                            candidiasis.                           One superficial esophageal ulcer was found 28 cm                            from the incisors. The lesion was 3 mm in largest                            dimension. Biopsies were taken with a cold forceps                            for histology.                           An area of hypopigmented mucosa in the lower                            esophagus was noted and biopsied. The exam of the                            esophagus was otherwise normal. No stenosis /                            stricture, no esophagitis, no evidence of EoE.                           Biopsies were taken with a cold forceps in the                            upper third of the esophagus, in the middle third                            of the esophagus and  in the lower third of the                            esophagus for histology.                           A single 8 mm sessile polyp was found in the                            gastric body. Biopsies were taken with a cold  forceps for histology.                           The exam of the stomach was otherwise normal.                           Biopsies were taken with a cold forceps in the                            gastric body, at the incisura and in the gastric                            antrum for Helicobacter pylori testing.                           The duodenal bulb and second portion of the                            duodenum were normal. Complications:            No immediate complications. Estimated blood loss:                            Minimal. Estimated Blood Loss:     Estimated blood loss was minimal. Impression:               - Esophagogastric landmarks identified.                           - 2 cm hiatal hernia.                           - Multiple small white plaques in the esophagus                            concerning for possible candidiasis.                           - One small esophageal ulcer. Biopsied.                           - Normal esophagus otherwise - biopsies obtained                            throughout.                           - A single gastric polyp, suspect benign fundic                            gland polyp. Biopsied.                           - Normal stomach otherwise - biopsies obtained to                            rule out H pylori                           -  Normal duodenal bulb and second portion of the                            duodenum.                           Possible that esophageal candidiasis is the cause                            of symptoms and would treat, also benign appearing                            ulcer noted (pill esophagitis?). Given persistent                            reflux symptoms  reported / early satiety, would                            consider trial of Reglan or gastric eymptying study                            as outpatient. Recommendation:           - Patient has a contact number available for                            emergencies. The signs and symptoms of potential                            delayed complications were discussed with the                            patient. Return to normal activities tomorrow.                            Written discharge instructions were provided to the                            patient.                           - Resume previous diet.                           - Continue present medications. - nexium twice                            daily and carafate                           - start fluconazole 400mg  x 1 and then 200mg  / day                            for 2 weeks if no contraindications                           -  consideration for trial of Reglan 5mg  TID prior                            to meals                           - Await pathology results with further                            recommendations Remo Lipps P. Brennden Masten, MD 03/11/2020 11:09:35 AM This report has been signed electronically.

## 2020-03-11 NOTE — Patient Instructions (Signed)
Handout provided on Hiatal hernia.  Start Fluconazole 400mg  x1 day, then 200mg  /day for a total of 14 days.  Start Reglan 5 mg by mouth 3 times/day before meals to help with motility in your stomach.  Continue Omeprazole twice daily and Carafate 4 times/day (changed Carafate to liquid).  YOU HAD AN ENDOSCOPIC PROCEDURE TODAY AT [facility]:   Refer to the procedure report that was given to you for any specific questions about what was found during the examination.  If the procedure report does not answer your questions, please call your gastroenterologist to clarify.  If you requested that your care partner not be given the details of your procedure findings, then the procedure report has been included in a sealed envelope for you to review at your convenience later.  YOU SHOULD EXPECT: Some feelings of bloating in the abdomen. Passage of more gas than usual.  Walking can help get rid of the air that was put into your GI tract during the procedure and reduce the bloating. If you had a lower endoscopy (such as a colonoscopy or flexible sigmoidoscopy) you may notice spotting of blood in your stool or on the toilet paper. If you underwent a bowel prep for your procedure, you may not have a normal bowel movement for a few days.  Please Note:  You might notice some irritation and congestion in your nose or some drainage.  This is from the oxygen used during your procedure.  There is no need for concern and it should clear up in a day or so.  SYMPTOMS TO REPORT IMMEDIATELY:    Following upper endoscopy (EGD)  Vomiting of blood or coffee ground material  New chest pain or pain under the shoulder blades  Painful or persistently difficult swallowing  New shortness of breath  Fever of 100F or higher  Black, tarry-looking stools  For urgent or emergent issues, a gastroenterologist can be reached at any hour by calling [phone]. Do not use MyChart messaging for urgent concerns.     DIET:  We do recommend a small meal at first, but then you may proceed to your regular diet.  Drink plenty of fluids but you should avoid alcoholic beverages for 24 hours.  ACTIVITY:  You should plan to take it easy for the rest of today and you should NOT DRIVE or use heavy machinery until tomorrow (because of the sedation medicines used during the test).    FOLLOW UP: Our staff will call the number listed on your records 48-72 hours following your procedure to check on you and address any questions or concerns that you may have regarding the information given to you following your procedure. If we do not reach you, we will leave a message.  We will attempt to reach you two times.  During this call, we will ask if you have developed any symptoms of COVID 19. If you develop any symptoms (ie: fever, flu-like symptoms, shortness of breath, cough etc.) before then, please call [phone].  If you test positive for Covid 19 in the 2 weeks post procedure, please call and report this information to [place].    If any biopsies were taken you will be contacted by phone or by letter within the next 1-3 weeks.  Please call us at [phone] if you have not heard about the biopsies in 3 weeks.    SIGNATURES/CONFIDENTIALITY: You and/or your care partner have signed paperwork which will be entered into your electronic medical record.  These signatures attest to the fact that that the information above on your After Visit Summary has been reviewed and is understood.  Full responsibility of the confidentiality of this discharge information lies with you and/or your care-partner.

## 2020-03-15 ENCOUNTER — Telehealth: Payer: Self-pay | Admitting: *Deleted

## 2020-03-15 NOTE — Telephone Encounter (Signed)
  Follow up Call-  Call back number 03/11/2020 05/09/2019  Post procedure Call Back phone  # [phone]  Permission to leave phone message Yes Yes     Patient questions:  Do you have a fever, pain , or abdominal swelling? No. Pain Score  0 *  Have you tolerated food without any problems? No.- pt still c/o burning in her esophagus with swallowing, this was the symptoms that brought her in for the procedure. Encouraged patient to continue with the Fluconazole for 2 weeks and Nexium and Carafate as prescribed by Dr. Havery Moros to treat suspected candidiasis and if her symptoms have not improved by the end of the 2 weeks to notify Dr. Havery Moros. Patient agreeable to plan of care.   Have you been able to return to your normal activities? Yes.    Do you have any questions about your discharge instructions: Diet   No. Medications  No. Follow up visit  No.  Do you have questions or concerns about your Care? No.  Actions: * If pain score is 4 or above: No action needed, pain <4.  1. Have you developed a fever since your procedure? no  2.   Have you had an respiratory symptoms (SOB or cough) since your procedure? no  3.   Have you tested positive for COVID 19 since your procedure no  4.   Have you had any family members/close contacts diagnosed with the COVID 19 since your procedure?  no   If yes to any of these questions please route to Joylene John, RN and Erenest Rasher, RN

## 2020-03-16 ENCOUNTER — Telehealth: Payer: Self-pay

## 2020-03-16 NOTE — Telephone Encounter (Signed)
-----   Message from Yetta Flock, MD sent at 03/16/2020  8:02 AM EDT ----- Eustaquio Maize can you help relay these results - patient of Dr. Silverio Decamp whom I helped perform urgent EGD this week - she has esophageal candidiasis noted with an ulcer in her esophagus, I think causing her symptoms. I started her on fluconazole, hope it is helping - biopsies of stomach show no evidence of H pylori. Benign polyp also noted - I had also given her a trial of low dose Reglan for early satiety, hope this is helping. She should continue present dose of PPI - she can follow up with Dr. Silverio Decamp or APP for reassessment. Thanks

## 2020-03-16 NOTE — Telephone Encounter (Signed)
Patient called back.  She is having persistent heartburn. Reglan is causing tremors and knots in her stomach, she is worried about potential adverse reaction given her history of MS.  Advised her to stop taking Reglan. She has taken Diflucan 400 mg on day 1 followed by 200 mg daily for the past 5 days.  She has not noticed any improvement and feels its making her symptoms worse.  Advised her to hold off taking any more Diflucan.  No history of immunocompromise state, the dose she has taken so far is adequate to treat esophageal candidiasis.  Switch to Dexilant 60 mg daily, please send prescription.  Patient is currently out of town and she would like a call back so she can give the pharmacy information. Continue Carafate suspension 1 g before meals and at bedtime 4 times daily as needed Use Gaviscon after meals as needed for breakthrough heartburn  Follow-up in office visit  K. Denzil Magnuson , MD [phone]

## 2020-03-16 NOTE — Telephone Encounter (Signed)
Thank you Richardson Landry for taking care of the patient. Jaclyn Shaggy, I wasn't aware that patient was scheduled for outpatient procedure in [place] and Yessi scheduled the procedure with Dr.Armbruster. For continuity of care, please make sure the primary MD is informed. Thank you  Beth, called patient to discuss medication management, didn't reach, left a message for patient to call back.

## 2020-03-16 NOTE — Telephone Encounter (Signed)
I called to review the results with the patient. She states she is struggling to take the medications due to the interactions between them.  She is on Omeprazole, Sucralfate and Reglan with the Diflucan. Her diet is chiefly dairy products. Instructions from the pharmacy warn her against taking the PPI and Carafate with the Diflucan. She is experiencing a burning in her stomach which she attributes to taking Diflucan at bedtime. I am not sure how to guide her on this. Suggestions?

## 2020-03-17 ENCOUNTER — Other Ambulatory Visit: Payer: Self-pay

## 2020-03-17 MED ORDER — DEXILANT 60 MG PO CPDR: 60.0000 mg | DELAYED_RELEASE_CAPSULE | Freq: Every day | ORAL | 3 refills | Status: DC

## 2020-03-17 NOTE — Telephone Encounter (Signed)
Spoke with the patient. Pharmacy confirmed. Rx transmitted.

## 2020-03-17 NOTE — Telephone Encounter (Signed)
Noted  

## 2020-03-18 ENCOUNTER — Encounter: Payer: 59 | Admitting: Gastroenterology

## 2020-03-30 ENCOUNTER — Encounter: Payer: Self-pay | Admitting: Nurse Practitioner

## 2020-03-30 ENCOUNTER — Ambulatory Visit (INDEPENDENT_AMBULATORY_CARE_PROVIDER_SITE_OTHER): Payer: 59 | Admitting: Nurse Practitioner

## 2020-03-30 VITALS — BP 98/62 | HR 67 | Temp 97.9°F | Ht 67.0 in | Wt 167.0 lb

## 2020-03-30 DIAGNOSIS — K2210 Ulcer of esophagus without bleeding: Secondary | ICD-10-CM

## 2020-03-30 DIAGNOSIS — B3781 Candidal esophagitis: Secondary | ICD-10-CM

## 2020-03-30 MED ORDER — DEXILANT 60 MG PO CPDR: 60.0000 mg | DELAYED_RELEASE_CAPSULE | Freq: Every day | ORAL | 5 refills | Status: DC

## 2020-03-30 MED ORDER — CLOTRIMAZOLE 10 MG MT TROC: 10.0000 mg | Freq: Every day | OROMUCOSAL | 0 refills | Status: AC

## 2020-03-30 NOTE — Progress Notes (Signed)
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 Took 2 days of Diflucan then stopped out of concern for concurrent use of acid blocker/Carafate.   --Trial of Mycelex troches 5 times daily x2 weeks.  --Over the telephone we intended to switch her PPI to [place] but she says prescription was never called in.  Will call in Dexilant 60 mg to take daily --Once taking Dexilant she can discontinue Prilosec.  --Continue Carafate 4 times daily --Follow-up with Dr. Silverio Decamp, appointment made      HPI:    Primary GI: Dr. Silverio Decamp    Chief complaint : Burning in chest and back  Christy Lowe was seen in the ED for 02/1420 for evaluation of epigastric pain, nausea and vomiting in the setting of high-dose NSAIDs taken for back pain.  We consulted on her in the emergency department.  CMP, lipase unremarkable, CBC unremarkable. RUQ [place] remarkable for nonspecific to biliary debris in the gallbladder lumen, no acute cholecystitis,  CBD normal at 3.2 .  Was discharged home from ED with plans for outpatient EGD which was actually done the following day by Dr. Havery Moros. EGD was remarkable for candida esophagitis, 2 cm hiatal hernia, one small esophageal ulcer and a fundic gland polyp.  She was prescribed Carafate, Reglan, fluconazole and continued on PPI .  Patient is here for follow-up  03/16/20 [name] called the office 03/16/2020 with persistent heartburn.  We changed her PPI to Dexilant 60 mg daily . Reglan was causing tremors and knots in her stomach. She was worried about adverse reactions of Reglan given history of multiple sclerosis so we discontinued it.  Also, patient was told by pharmacy that she should not take the  Diflucan along with acid blocker/Carafate. She has already taken the first dose of 200 mg followed by another 100 mg on the second day.  Since her symptoms were not improved we went ahead and discontinued the Diflucan.  She was advised to use Gaviscon as needed for breakthrough heartburn.  UPDATE SINCE LAST VISIT  Christy Lowe says the Dexilant was never called to the pharmacy.  She has been taking omeprazole 40 mg daily.  She is taking Carafate 4 times daily.  She has also taken pepto.  Both Carafate and Pepto-Bismol transiently relieve the burning.  She feels quite miserable with burning in her chest through to her back.  She has no appetite, feels nauseated.  Her weight is down from 182 pounds to 167 pounds since mid April.   Previous Endoscopic Evaluation:   EGD 03/11/20 Esophagogastric landmarks identified. - 2 cm hiatal hernia. - Multiple small white plaques in the esophagus concerning for possible candidiasis. - One small esophageal ulcer. Biopsied. - Normal esophagus otherwise - biopsies obtained throughout. - A single gastric polyp, suspect benign fundic gland polyp. Biopsied. - Normal stomach otherwise - biopsies obtained to rule out H pylori - Normal duodenal bulb and second portion of the duodenum.  Surgical [P], gastric antrum and gastric body - GASTRIC ANTRAL AND OXYNTIC MUCOSA WITH NO SPECIFIC HISTOPATHOLOGIC CHANGES - WARTHIN STARRY STAIN IS NEGATIVE FOR HELICOBACTER PYLORI 2. Surgical [P], gastric polyp biopsy   - FUNDIC GLAND POLYP 3. Surgical [P], random esophagus -  CANDIDAL ESOPHAGITIS (CONFIRMED WITH PAS/F STAIN) 4. Surgical [P], esophagus 28cm - CANDIDAL ESOPHAGITIS WITH ULCERATION   Review of systems:     No chest pain, no SOB, no fevers, no urinary sx   Past Medical History:  Diagnosis Date  . Clotting disorder (HCC) factor 5   . Dysmenorrhea   . GERD (gastroesophageal reflux disease)   . Heart murmur  PFO   . Hemangioma   . History of DVT (deep vein  thrombosis)   . History of endometriosis   . Hypotension   . Migraine without aura   . MS (multiple sclerosis) ([place])   . Multiple sclerosis ([place]) 08/26/2019  . Stroke [facility])    ages 89 and 51    Patient's surgical history, family medical history, social history, medications and allergies were all reviewed in Epic   Serum creatinine: 0.85 mg/dL 03/10/20 1117 Estimated creatinine clearance: 97.2 mL/min  Current Outpatient Medications  Medication Sig Dispense Refill  . aspirin 325 MG tablet Take 325 mg by mouth daily. Takes every day    . omeprazole (PRILOSEC) 40 MG capsule Take 40 mg by mouth daily.    . sucralfate (CARAFATE) 1 GM/10ML suspension Take 10 mLs (1 g total) by mouth 4 (four) times daily. 420 mL 1   No current facility-administered medications for this visit.    Physical Exam:     BP 98/62   Pulse 67   Temp 97.9 F (36.6 C)   Ht 5\' 7"  (1.702 m)   Wt 167 lb (75.8 kg)   BMI 26.16 kg/m   GENERAL:  Pleasant female in NAD PSYCH: : Cooperative, normal affect CARDIAC:  RRR,  PULM: Normal respiratory effort, lungs CTA bilaterally, no wheezing ABDOMEN:  Nondistended, soft, nontender. No obvious masses, no hepatomegaly,  normal bowel sounds SKIN:  turgor, no lesions seen Musculoskeletal:  Normal muscle tone, normal strength NEURO: Alert and oriented x 3, no focal neurologic deficits   Tye Savoy , NP 03/30/2020, 9:10 AM

## 2020-03-30 NOTE — Progress Notes (Signed)
Reviewed and agree with documentation and assessment and plan. K. Veena Jousha Schwandt , MD   

## 2020-03-30 NOTE — Patient Instructions (Addendum)
If you are age 37 or older, your body mass index should be between 23-30. Your Body mass index is 26.16 kg/m. If this is out of the aforementioned range listed, please consider follow up with your Primary Care Provider.  If you are age 8 or younger, your body mass index should be between 19-25. Your Body mass index is 26.16 kg/m. If this is out of the aformentioned range listed, please consider follow up with your Primary Care Provider.   We have sent the following medications to your pharmacy for you to pick up at your convenience: Dexilant Mycelex Trouche  Continue Carafate four times daily.  STOP Omeprazole.  Follow up with Dr. Silverio Decamp on 05/06/20 at 10:40 am.  Thank you for choosing me and [facility].   Tye Savoy, NP

## 2020-03-31 ENCOUNTER — Telehealth: Payer: Self-pay | Admitting: Nurse Practitioner

## 2020-03-31 ENCOUNTER — Other Ambulatory Visit: Payer: Self-pay

## 2020-03-31 ENCOUNTER — Other Ambulatory Visit: Payer: Self-pay | Admitting: Nurse Practitioner

## 2020-03-31 MED ORDER — DEXILANT 60 MG PO CPDR: 60.0000 mg | DELAYED_RELEASE_CAPSULE | Freq: Every day | ORAL | 5 refills | Status: DC

## 2020-03-31 NOTE — Telephone Encounter (Signed)
Patient called states [facility] did not receive her script for Dexilant please resend and also said she was offered some samples but didn't get to receive them.

## 2020-03-31 NOTE — Addendum Note (Signed)
Addended by: Candie Mile on: 03/31/2020 09:59 AM   Modules accepted: Orders

## 2020-04-01 ENCOUNTER — Other Ambulatory Visit: Payer: Self-pay

## 2020-04-01 MED ORDER — PANTOPRAZOLE SODIUM 40 MG PO TBEC: 40.0000 mg | DELAYED_RELEASE_TABLET | Freq: Every morning | ORAL | 3 refills | Status: DC

## 2020-04-01 NOTE — Telephone Encounter (Signed)
Prescription sent for Pantoprazole. Dexilant not covered by insurance. Patient aware.

## 2020-04-05 ENCOUNTER — Telehealth: Payer: Self-pay | Admitting: Nurse Practitioner

## 2020-04-05 NOTE — Telephone Encounter (Signed)
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  She was having abdominal pain. See previous office notes. States her employer knows what has been "going on" as to her health issues.  May I give her a work note for this time?

## 2020-04-06 ENCOUNTER — Other Ambulatory Visit: Payer: Self-pay

## 2020-04-06 NOTE — Telephone Encounter (Signed)
[name] you can cover her for those days.  She is feeling better

## 2020-04-07 NOTE — Telephone Encounter (Signed)
Patient notified. Letter at the front desk.

## 2020-04-22 ENCOUNTER — Telehealth: Payer: Self-pay | Admitting: Nurse Practitioner

## 2020-04-22 NOTE — Telephone Encounter (Signed)
Patient called requesting to reschedule an EGD with Dr. Sheppard Penton. The patient specifically requested either 05/23/20 or 05/24/20 for the procedure. According to the schedule these two days are not available. Patient stated those are the only two days she will be in town and is now requesting that someone else does it. She mentioned Dr. Silverio Decamp had done this before for her. Please advise

## 2020-04-22 NOTE — Telephone Encounter (Signed)
The patient was scheduled for an office visit on 05/06/20. She will be out of state at that time and is not returning until 05/24/20. She will be available until 05/26/20. She wanted to get an EGD with dilation done at that time, foregoing the office visit. She is a little better. She does endorse she still has burning and choking if she is not very careful. She chooses her foods carefully staying with soft foods. Reports to date she has lost 18 pounds. She is open to a virtual visit if that is an option. She will have a 4 hour time difference in [place].

## 2020-04-22 NOTE — Telephone Encounter (Signed)
We can not do virtual visit out of state. Please advise patient to schedule follow up visit when she is planning to be back in [place]. Thanks

## 2020-04-23 NOTE — Telephone Encounter (Signed)
Spoke with the patient. She accepts the appointment offered on 05/26/20 at 3:30pm. Dr Silverio Decamp is not in the office on 05/24/20 and 05/25/20 which are the other dates the patient is available.

## 2020-05-06 ENCOUNTER — Ambulatory Visit: Payer: 59 | Admitting: Gastroenterology

## 2020-05-16 ENCOUNTER — Other Ambulatory Visit: Payer: Self-pay | Admitting: Gastroenterology

## 2020-05-16 DIAGNOSIS — G8929 Other chronic pain: Secondary | ICD-10-CM

## 2020-05-16 DIAGNOSIS — R1310 Dysphagia, unspecified: Secondary | ICD-10-CM

## 2020-05-16 DIAGNOSIS — K219 Gastro-esophageal reflux disease without esophagitis: Secondary | ICD-10-CM

## 2020-05-16 DIAGNOSIS — K2289 Other specified disease of esophagus: Secondary | ICD-10-CM

## 2020-05-16 DIAGNOSIS — K317 Polyp of stomach and duodenum: Secondary | ICD-10-CM

## 2020-05-26 ENCOUNTER — Ambulatory Visit: Payer: 59 | Admitting: Gastroenterology

## 2020-09-27 ENCOUNTER — Telehealth: Payer: Self-pay

## 2020-09-27 NOTE — Telephone Encounter (Signed)
Please call her. I do not treat MS.  She would need to see Neurologist that she saw previously at [facility]. Also, if she has moved, she needs to find a primary doctor where she is living.

## 2020-09-27 NOTE — Telephone Encounter (Signed)
Patient called states she is having a flare up of her MS going on for 3 weeks now it is getting worse, her hands are shaking, face drooping, and her words are not coming out correctly? She wants to come in to be seen next week on 11/09 is it okay to schedule?

## 2020-09-27 NOTE — Telephone Encounter (Signed)
LVM that DR Renold Genta does not treat MS that she would need to contact neurology

## 2020-09-28 NOTE — Telephone Encounter (Signed)
Patient called back and spoke with Dr Renold Genta

## 2020-10-12 ENCOUNTER — Other Ambulatory Visit: Payer: Self-pay

## 2020-10-12 ENCOUNTER — Encounter: Payer: Self-pay | Admitting: Neurology

## 2020-10-12 ENCOUNTER — Ambulatory Visit (INDEPENDENT_AMBULATORY_CARE_PROVIDER_SITE_OTHER): Payer: 59 | Admitting: Neurology

## 2020-10-12 VITALS — BP 111/76 | HR 72 | Ht 67.0 in | Wt 182.0 lb

## 2020-10-12 DIAGNOSIS — R5383 Other fatigue: Secondary | ICD-10-CM | POA: Insufficient documentation

## 2020-10-12 DIAGNOSIS — R251 Tremor, unspecified: Secondary | ICD-10-CM | POA: Diagnosis not present

## 2020-10-12 MED ORDER — TOPIRAMATE 25 MG PO TABS: 25.0000 mg | ORAL_TABLET | Freq: Two times a day (BID) | ORAL | 6 refills | Status: DC

## 2020-10-12 NOTE — Progress Notes (Signed)
Chief Complaint  Patient presents with  . Established patient/New problem    l/s by Dr. Krista Blue 05/15/2019. Referred by Tedra Senegal, MD.  . Tremors    Pt reports that her hands shake, her left eyelid twitches, her hair is falling out, visual disturbances, and fatigue.  . Other    Pt is here alone.    HISTORICAL  Christy Lowe is a 39 years old left-handed female, seen in request by her primary care physician Dr. Tedra Senegal for evaluation of migraine headache, multiple sclerosis, initial evaluation is to virtual visit on May 15, 2019.  I have reviewed and summarized the referring note from the referring physician.  She recently moved from [place] to [place], was previously treated at [facility], [place], [place] [address], [phone]  She presented with sudden onset right arm and leg weakness, right facial weakness, word finding difficulties in 2014, was treated at [facility], per patient, MRI of the brain showed abnormality consistent with multiple sclerosis, she was seen by neurologist, positive lumbar puncture, I do not have the above record, she was treated with Solu-Medrol, dexamethasone per patient, her symptoms has much improved, she was also given prescription of Cymbalta, Topamax, but none of the long-term immunomodulation therapy, she has regularly repeat MRI of the brain over the years  She has 2 or 3 recurrent spells every year, presenting with sudden onset right-sided weakness, word finding difficulties, lasting for hours to days, also has right side paresthesia.  Most recent flareup was in May 2020, now back to baseline, continue has mild right-sided weakness, she is apparently get frustrated easily,  Reviewing history of also showed history of factor V Leyden, history of DVT, was taking aspirin, history of benign tumor of esophagus, was completely removed in the past, had swallowing difficulty, with recent seen by GI specialist, had a EGD,  tumor removed, with esophagus constriction stretch procedure   Laboratory evaluations in June 2020, factor V Leyden negative, normal ESR 9, CBC hemoglobin of 12.9, TSH, LDL elevated 120, normal CMP,     Personally reviewed MRI of the brain with without contrast July 2020 showed no significant abnormality,  Laboratory evaluation 2021, normal CBC hemoglobin of 13.8, CMP, with exception of mildly decreased calcium 8.6,  UPDATE Oct 12 2020: We have personally reviewed MRI of the brain again with without contrast in July 2020, no evidence of MS, only mild supratentorium small vessel disease  Also CT soft tissue of the neck, chronic left sphenoid sinusitis, no significant abnormality. CT of the chest and abdomen, no acute abnormality  She continues to have constellation of complaints, including intermittent bilateral hand posturing tremor, right worse than left, today she also complains of hair loss over the past few weeks, also complains of worsening blurry vision, had a history of Lasik surgery in the past, recent new prescription has taken care of her problem  She also complains of frequent headaches, with migraine features,   Laboratory evaluation in April 2021: CMP, calcium 8.6, normal CBC hemoglobin of 13.8, lipase 22   REVIEW OF SYSTEMS: Full 14 system review of systems performed and notable only for as above All other review of systems were negative.  ALLERGIES: Allergies  Allergen Reactions  . Cephalexin Hives and Shortness Of Breath  . Penicillins Hives and Anaphylaxis    SOB As child   . Amitiza [Lubiprostone]   . Sulfa Antibiotics Other (See Comments)    As child Doesn't remember  HOME MEDICATIONS: Current Outpatient Medications  Medication Sig Dispense Refill  . aspirin 325 MG tablet Take 325 mg by mouth daily. Takes every day    . sucralfate (CARAFATE) 1 GM/10ML suspension Take 10 mLs (1 g total) by mouth 4 (four) times daily. 420 mL 1   No current  facility-administered medications for this visit.    PAST MEDICAL HISTORY: Past Medical History:  Diagnosis Date  . Clotting disorder (HCC) factor 5   . Dysmenorrhea   . GERD (gastroesophageal reflux disease)   . Heart murmur  PFO   . Hemangioma   . History of DVT (deep vein thrombosis)   . History of endometriosis   . Hypotension   . Migraine without aura   . MS (multiple sclerosis) ([place])   . Multiple sclerosis ([place]) 08/26/2019  . Stroke [facility])    ages 18 and 59    PAST SURGICAL HISTORY: Past Surgical History:  Procedure Laterality Date  . ANTERIOR CRUCIATE LIGAMENT REPAIR    . PELVIC LAPAROSCOPY     endometriosis  . TONSILLECTOMY      FAMILY HISTORY: Family History  Problem Relation Age of Onset  . Prostate cancer Paternal Grandfather   . Heart attack Paternal Grandfather   . Esophageal cancer Paternal Grandfather   . Breast cancer Other   . Alcohol abuse Mother   . Diabetes Maternal Grandmother   . Cervical cancer Maternal Grandmother   . ALS Maternal Grandmother   . Prostate cancer Maternal Grandfather   . Heart attack Maternal Grandfather   . Colon cancer Neg Hx   . Stomach cancer Neg Hx   . Rectal cancer Neg Hx     SOCIAL HISTORY: Social History   Socioeconomic History  . Marital status: Single    Spouse name: Not on file  . Number of children: Not on file  . Years of education: Not on file  . Highest education level: Not on file  Occupational History  . Not on file  Tobacco Use  . Smoking status: Never Smoker  . Smokeless tobacco: Never Used  Vaping Use  . Vaping Use: Never used  Substance and Sexual Activity  . Alcohol use: Yes    Alcohol/week: 2.0 standard drinks    Types: 2 Standard drinks or equivalent per week  . Drug use: Never  . Sexual activity: Yes    Birth control/protection: Surgical    Comment: partner with vasectomy  Other Topics Concern  . Not on file  Social History Narrative  . Not on file   Social Determinants of  Health   Financial Resource Strain:   . Difficulty of Paying Living Expenses: Not on file  Food Insecurity:   . Worried About Charity fundraiser in the Last Year: Not on file  . Ran Out of Food in the Last Year: Not on file  Transportation Needs:   . Lack of Transportation (Medical): Not on file  . Lack of Transportation (Non-Medical): Not on file  Physical Activity:   . Days of Exercise per Week: Not on file  . Minutes of Exercise per Session: Not on file  Stress:   . Feeling of Stress : Not on file  Social Connections:   . Frequency of Communication with Friends and Family: Not on file  . Frequency of Social Gatherings with Friends and Family: Not on file  . Attends Religious Services: Not on file  . Active Member of Clubs or Organizations: Not on file  . Attends Club or  Organization Meetings: Not on file  . Marital Status: Not on file  Intimate Partner Violence:   . Fear of Current or Ex-Partner: Not on file  . Emotionally Abused: Not on file  . Physically Abused: Not on file  . Sexually Abused: Not on file     PHYSICAL EXAM   Vitals:   10/12/20 1508  BP: 111/76  Pulse: 72  Weight: 182 lb (82.6 kg)  Height: '5\' 7"'  (1.702 m)   Not recorded     Body mass index is 28.51 kg/m.  PHYSICAL EXAMNIATION:  Gen: NAD, conversant, well nourised, well groomed  NEUROLOGICAL EXAM:  MENTAL STATUS: Speech/cognition: Awake, alert, oriented to history taking and casual conversation   CRANIAL NERVES: CN II: Visual fields are full to confrontation. Pupils are round equal and briskly reactive to light. CN III, IV, VI: extraocular movement are normal. No ptosis. CN V: Facial sensation is intact to light touch CN VII: Face is symmetric with normal eye closure  CN VIII: Hearing is normal to causal conversation. CN IX, X: Phonation is normal. CN XI: Head turning and shoulder shrug are intact  MOTOR: There is no pronator drift of out-stretched arms. Muscle bulk and tone are  normal. Muscle strength is normal.  REFLEXES: Reflexes are 2+ and symmetric at the biceps, triceps, knees, and ankles. Plantar responses are flexor.  SENSORY: Intact to light touch, pinprick and vibratory sensation are intact in fingers and toes.  COORDINATION: There is no trunk or limb dysmetria noted.  GAIT/STANCE: Posture is normal. Gait is steady with normal steps   DIAGNOSTIC DATA (LABS, IMAGING, TESTING) - I reviewed patient records, labs, notes, testing and imaging myself where available.   ASSESSMENT AND PLAN  Christy Lowe is a 39 y.o. female   Constellation of complaints, posturing tremor, fatigue,  MRI of brain and history does not support diagnosis of MS,  Laboratory evaluation to rule out treatable etiology including thyroid functional test, Frequent headaches, with migraine features  Will start low-dose Topamax 25 mg twice daily as preventive medication  Continue follow-up with her primary care physician   Marcial Pacas, M.D. Ph.D.  [facility] [address], [place], [place] [address] Ph: [phone] Fax: [phone]  CC:  [name], MD [address] [place],  [place] [address]

## 2020-10-13 ENCOUNTER — Ambulatory Visit (INDEPENDENT_AMBULATORY_CARE_PROVIDER_SITE_OTHER): Payer: 59 | Admitting: Nurse Practitioner

## 2020-10-13 ENCOUNTER — Encounter: Payer: Self-pay | Admitting: Nurse Practitioner

## 2020-10-13 VITALS — BP 104/74 | HR 77 | Ht 67.0 in | Wt 181.2 lb

## 2020-10-13 DIAGNOSIS — R1013 Epigastric pain: Secondary | ICD-10-CM | POA: Diagnosis not present

## 2020-10-13 DIAGNOSIS — R1310 Dysphagia, unspecified: Secondary | ICD-10-CM | POA: Diagnosis not present

## 2020-10-13 DIAGNOSIS — R194 Change in bowel habit: Secondary | ICD-10-CM | POA: Diagnosis not present

## 2020-10-13 DIAGNOSIS — R109 Unspecified abdominal pain: Secondary | ICD-10-CM

## 2020-10-13 LAB — ANA W/REFLEX IF POSITIVE
Anti JO-1: <0.2 AI (ref 0.0–0.9)
Anti Nuclear Antibody (ANA): POSITIVE — AB
Centromere Ab Screen: <0.2 AI (ref 0.0–0.9)
Chromatin Ab SerPl-aCnc: <0.2 AI (ref 0.0–0.9)
ENA RNP Ab: <0.2 AI (ref 0.0–0.9)
ENA SM Ab Ser-aCnc: <0.2 AI (ref 0.0–0.9)
ENA SSA (RO) Ab: <0.2 AI (ref 0.0–0.9)
ENA SSB (LA) Ab: <0.2 AI (ref 0.0–0.9)
Scleroderma (Scl-70) (ENA) Antibody, IgG: <0.2 AI (ref 0.0–0.9)
dsDNA Ab: 24 IU/mL — ABNORMAL HIGH (ref 0–9)

## 2020-10-13 LAB — C-REACTIVE PROTEIN: CRP: 1 mg/L (ref 0–10)

## 2020-10-13 LAB — THYROID PANEL WITH TSH
Free Thyroxine Index: 1.8 (ref 1.2–4.9)
T3 Uptake Ratio: 30 % (ref 24–39)
T4, Total: 5.9 ug/dL (ref 4.5–12.0)
TSH: 3.38 u[IU]/mL (ref 0.450–4.500)

## 2020-10-13 LAB — VITAMIN B12: Vitamin B-12: 524 pg/mL (ref 232–1245)

## 2020-10-13 LAB — VITAMIN D 25 HYDROXY (VIT D DEFICIENCY, FRACTURES): Vit D, 25-Hydroxy: 27.9 ng/mL — ABNORMAL LOW (ref 30.0–100.0)

## 2020-10-13 LAB — SEDIMENTATION RATE: Sed Rate: 2 mm/hr (ref 0–32)

## 2020-10-13 LAB — CK: Total CK: 108 U/L (ref 32–182)

## 2020-10-13 MED ORDER — OMEPRAZOLE 20 MG PO CPDR: 20.0000 mg | DELAYED_RELEASE_CAPSULE | Freq: Every day | ORAL | 3 refills | Status: DC

## 2020-10-13 NOTE — Progress Notes (Signed)
ASSESSMENT AND PLAN    # Recurrent epigastric burning radiating through to back and solid food dysphagia. Hx of reflux esophagitis, candida esophagitis, small esophageal ulcer, benign esophageal stenosis  --Resume Omeprazole 20 mg daily before breakfast --Continue Carafate ac.  --No obvious oral candida on exam --Will arrange for EGD, possibly with repeat dilation. The risks and benefits of EGD were discussed and the patient agrees to proceed.  ---Advised patient to eat small bites, chew well with liquids in between bites to avoid food impaction.   # Chronic left sided abdominal pain. Describes a "piching pain"  CT scan with contrast in July 2020 for the pain was negative. --Trial of Salon Pas patches as directed.  --Pain doesn't know seem GI in nature. Seems muscular or neuropathic.   # Bowel changes. Stools unformed but not diarrhea. She has had a mild increase in frequency but still only have a BM every 1-2 days. She has watery diarrhea about once a month on a chronic basis. Normal ESR yesterday ( Neurology). Normal thyroid studies. No weight loss or blood in stool.  --No workup needed for now. If develops diarrhea will obtain stool studies. Certain we should know if develops blood in stool     HISTORY OF PRESENT ILLNESS     Primary Gastroenterologist : Christy Bowie, MD  Chief Complaint : throat burning, back burning, dysphagia, left side pain, bowel changes  Christy Lowe is a 39 y.o. female with PMH / 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 She has history of reflux esophagitis , candida esophagitis, a small esophageal ulcer and benign esophageal stenosis s/p dilation in June 2020. Patient was  last seen May 2021 following EGD in April.She was not abe to take the Diflucan for candida esophagitis because if interacted with one of her medications per the pharmacist. She was instead treated with Mycelex troches.  She was continued on Carafate and the intention was for her to continue PPI but it appears that has since been stopped. She was to follow up with Dr. Silverio Lowe but the appointment was cancelled.   Christy Lowe is back with burning in throat and  Burning in epigastrium and her.  Burning not related to eating but she does avoid spicy foods and Etoh. Milk and wintergreen mints help the burning. She had been doing well for the past several months, symptoms restarted in August. Also since August she has been having intermittent dysphagia again, especially to meat. A few times she had to chase food down with liquid. On one occasion in September patient was dining out and thought a piece of steak was not going to pass.  Still taking Carafate TID. No recent antibiotics / doesn't use inhaler.   Patient complains of green stool. Stools have been unformed since last week of September but only having a BM every 1-2 days. However she normally only has a BM only every 3-4 days so there has been a minor increase in frequency. She takes Collagen but no other supplements. No new medications excep  She has left sided abdominal discomfort. The discomfort is lateral left ( above waist).  Feels like a constant "pinch" which gets worse if physicaly active. Pain doesn't get better with BM, it doesn't get better  or worse after eating. She is about to be restart onTopamax yesterday for hand tremors.     Her weight is up 14 pounds since May.   IPrevious Endoscopic Evaluations / Pertinent Studies:   June 2020 EGD for dysphagia, epigastric burning and abnormal barium swallow --LA Grade B reflux esophagitis. Biopsied. - Benign-appearing esophageal stenosis. Dilated. - Small hiatal hernia.  April 2021 EGD recurrent  epigastric pain and dysphagia --Esophagogastric landmarks identified. - 2 cm hiatal hernia. - Multiple small white plaques in the esophagus concerning for possible candidiasis. - One small esophageal ulcer. Biopsied. - Normal esophagus otherwise - biopsies obtained throughout. - A single gastric polyp, suspect benign fundic gland polyp. Biopsied. - Normal stomach otherwise - biopsies obtained to rule out H pylori - Normal duodenal bulb and second portion of the duodenum.  Past Medical History:  Diagnosis Date  . Clotting disorder (HCC) factor 5   . Dysmenorrhea   . GERD (gastroesophageal reflux disease)   . Heart murmur  PFO   . Hemangioma   . History of DVT (deep vein thrombosis)   . History of endometriosis   . Hypotension   . Migraine without aura   . MS (multiple sclerosis) ([place])   . Multiple sclerosis ([place]) 08/26/2019  . Stroke [facility])    ages 32 and 74    Current Medications, Allergies, Past Surgical History, Family History and Social History were reviewed in [place] record.   Current Outpatient Medications  Medication Sig Dispense Refill  . aspirin 325 MG tablet Take 325 mg by mouth daily. Takes every day    . sucralfate (CARAFATE) 1 GM/10ML suspension Take 10 mLs (1 g total) by mouth 4 (four) times daily. 420 mL 1  . topiramate (TOPAMAX) 25 MG tablet Take 1 tablet (25 mg total) by mouth 2 (two) times daily. 60 tablet 6   No current facility-administered medications for this visit.    Review of Systems: No chest pain. No shortness of breath. No urinary complaints.   PHYSICAL EXAM :    Wt Readings from Last 3 Encounters:  10/13/20 181 lb 4 oz (82.2 kg)  10/12/20 182 lb (82.6 kg)  03/30/20 167 lb (75.8 kg)    BP 104/74 (BP Location: Right Arm, Patient Position: Sitting, Cuff Size: Normal)   Pulse 77   Ht '5\' 7"'  (1.702 m)   Wt 181 lb 4 oz (82.2 kg)   BMI 28.39 kg/m  Constitutional:  Pleasant female in no acute distress. Psychiatric:  Normal mood and affect. Behavior is normal. EENT: Pupils normal.  Conjunctivae are normal. No scleral icterus. Neck supple.  Cardiovascular: Normal rate, regular rhythm. No edema Pulmonary/chest: Effort normal and breath sounds normal. No wheezing, rales or rhonchi. Abdominal: Soft, nondistended, nontender. Bowel sounds active throughout. There are no masses palpable. No hepatomegaly. Neurological: Alert and oriented to person place and time. Skin: Skin is warm and dry. No rashes noted.  Tye Savoy, NP  10/13/2020, 11:11 AM

## 2020-10-13 NOTE — Progress Notes (Signed)
Thank you for your careful, thorough evaluation.

## 2020-10-13 NOTE — Patient Instructions (Signed)
If you are age 37 or older, your body mass index should be between 23-30. Your Body mass index is 28.39 kg/m. If this is out of the aforementioned range listed, please consider follow up with your Primary Care Provider.  If you are age 61 or younger, your body mass index should be between 19-25. Your Body mass index is 28.39 kg/m. If this is out of the aformentioned range listed, please consider follow up with your Primary Care Provider.   We have sent the following medications to your pharmacy for you to pick up at your convenience: Omeprazole 20 mg daily.   You have been scheduled for an endoscopy. Please follow written instructions given to you at your visit today. If you use inhalers (even only as needed), please bring them with you on the day of your procedure.  Due to recent changes in healthcare laws, you may see the results of your imaging and laboratory studies on MyChart before your provider has had a chance to review them.  We understand that in some cases there may be results that are confusing or concerning to you. Not all laboratory results come back in the same time frame and the provider may be waiting for multiple results in order to interpret others.  Please give [place] 48 hours in order for your provider to thoroughly review all the results before contacting the office for clarification of your results.

## 2020-10-14 ENCOUNTER — Telehealth: Payer: Self-pay | Admitting: Neurology

## 2020-10-14 ENCOUNTER — Encounter: Payer: Self-pay | Admitting: Nurse Practitioner

## 2020-10-14 DIAGNOSIS — R768 Other specified abnormal immunological findings in serum: Secondary | ICD-10-CM

## 2020-10-14 NOTE — Telephone Encounter (Signed)
I returned the call to the patient. States another physician indicated she may have Lupus based on her recent labs. She would like to know if she needs a referral to another specialist. Says her hair is coming out in large quantities and she would like to know the next step in her care.

## 2020-10-14 NOTE — Telephone Encounter (Addendum)
Please call her, extensive laboratory evaluation showed mildly decreased vitamin D level, 28, she would benefit over-the-counter vitamin D3 supplement 1000 units daily. She also has positive ANA, double-stranded DNA with unknown clinical significance,  I have forward the lab result to her primary care physician Dr. Renold Genta, she may contact her for potential repeat test later, or  rheumatologist refer

## 2020-10-14 NOTE — Telephone Encounter (Signed)
pt aware of lupus diagnosis, pt asking for something to be called in  for her hair falling out in large masses

## 2020-10-14 NOTE — Addendum Note (Signed)
Addended by: Noberto Retort C on: 10/14/2020 12:27 PM   Modules accepted: Orders

## 2020-10-14 NOTE — Telephone Encounter (Signed)
I spoke to the patient. She verbalized understanding of the lab results. She was in agreement to starting the recommended vitamin D supplement. She would like to move forward to a rheumatology referral. It has been placed in Epic and she is aware for expect a call from that office for scheduling.

## 2020-10-14 NOTE — Telephone Encounter (Signed)
Needs anti-Smith antibody to rule out lupus. That would be definitive.

## 2020-10-15 ENCOUNTER — Encounter: Payer: Self-pay | Admitting: Gastroenterology

## 2020-10-15 ENCOUNTER — Other Ambulatory Visit: Payer: Self-pay | Admitting: *Deleted

## 2020-10-15 ENCOUNTER — Ambulatory Visit (AMBULATORY_SURGERY_CENTER): Payer: 59 | Admitting: Gastroenterology

## 2020-10-15 ENCOUNTER — Other Ambulatory Visit: Payer: Self-pay

## 2020-10-15 VITALS — BP 96/52 | HR 78 | Temp 98.3°F | Resp 13 | Ht 67.0 in | Wt 181.0 lb

## 2020-10-15 DIAGNOSIS — K319 Disease of stomach and duodenum, unspecified: Secondary | ICD-10-CM

## 2020-10-15 DIAGNOSIS — K219 Gastro-esophageal reflux disease without esophagitis: Secondary | ICD-10-CM

## 2020-10-15 DIAGNOSIS — R1319 Other dysphagia: Secondary | ICD-10-CM | POA: Diagnosis not present

## 2020-10-15 DIAGNOSIS — T8859XA Other complications of anesthesia, initial encounter: Secondary | ICD-10-CM

## 2020-10-15 DIAGNOSIS — R1013 Epigastric pain: Secondary | ICD-10-CM

## 2020-10-15 HISTORY — DX: Other complications of anesthesia, initial encounter: T88.59XA

## 2020-10-15 MED ORDER — SODIUM CHLORIDE 0.9 % IV SOLN: 500.0000 mL | Freq: Once | INTRAVENOUS | Status: DC

## 2020-10-15 MED ORDER — MAGIC MOUTHWASH W/LIDOCAINE: 5.0000 mL | Freq: Three times a day (TID) | ORAL | 1 refills | Status: DC | PRN

## 2020-10-15 MED ORDER — OMEPRAZOLE 20 MG PO CPDR: 20.0000 mg | DELAYED_RELEASE_CAPSULE | Freq: Two times a day (BID) | ORAL | 3 refills | Status: DC

## 2020-10-15 NOTE — Progress Notes (Signed)
A/ox3, pleased with MAC, report to RN 

## 2020-10-15 NOTE — Patient Instructions (Signed)
Pick up Rx at [facility] for magic mouthwash.  Rx sent to [facility] for Prilosec, pick up Sunday.  Handout given for GERD/anti-reflux measures.  YOU HAD AN ENDOSCOPIC PROCEDURE TODAY AT [facility]:   Refer to the procedure report that was given to you for any specific questions about what was found during the examination.  If the procedure report does not answer your questions, please call your gastroenterologist to clarify.  If you requested that your care partner not be given the details of your procedure findings, then the procedure report has been included in a sealed envelope for you to review at your convenience later.  YOU SHOULD EXPECT: Some feelings of bloating in the abdomen. Passage of more gas than usual.  Walking can help get rid of the air that was put into your GI tract during the procedure and reduce the bloating. If you had a lower endoscopy (such as a colonoscopy or flexible sigmoidoscopy) you may notice spotting of blood in your stool or on the toilet paper. If you underwent a bowel prep for your procedure, you may not have a normal bowel movement for a few days.  Please Note:  You might notice some irritation and congestion in your nose or some drainage.  This is from the oxygen used during your procedure.  There is no need for concern and it should clear up in a day or so.  SYMPTOMS TO REPORT IMMEDIATELY:   Following upper endoscopy (EGD)  Vomiting of blood or coffee ground material  New chest pain or pain under the shoulder blades  Painful or persistently difficult swallowing  New shortness of breath  Fever of 100F or higher  Black, tarry-looking stools  For urgent or emergent issues, a gastroenterologist can be reached at any hour by calling [phone]. Do not use MyChart messaging for urgent concerns.    DIET:  We do recommend a small meal at first, but then you may proceed to your regular diet.  Drink plenty of fluids but you should avoid alcoholic  beverages for 24 hours.  ACTIVITY:  You should plan to take it easy for the rest of today and you should NOT DRIVE or use heavy machinery until tomorrow (because of the sedation medicines used during the test).    FOLLOW UP: Our staff will call the number listed on your records 48-72 hours following your procedure to check on you and address any questions or concerns that you may have regarding the information given to you following your procedure. If we do not reach you, we will leave a message.  We will attempt to reach you two times.  During this call, we will ask if you have developed any symptoms of COVID 19. If you develop any symptoms (ie: fever, flu-like symptoms, shortness of breath, cough etc.) before then, please call [phone].  If you test positive for Covid 19 in the 2 weeks post procedure, please call and report this information to [place].    If any biopsies were taken you will be contacted by phone or by letter within the next 1-3 weeks.  Please call us at [phone] if you have not heard about the biopsies in 3 weeks.    SIGNATURES/CONFIDENTIALITY: You and/or your care partner have signed paperwork which will be entered into your electronic medical record.  These signatures attest to the fact that that the information above on your After Visit Summary has been reviewed and is understood.  Full responsibility of  the confidentiality of this discharge information lies with you and/or your care-partner. 

## 2020-10-15 NOTE — Progress Notes (Signed)
Medical history reviewed. VS assessed by C.W 

## 2020-10-15 NOTE — Progress Notes (Signed)
Called to room to assist during endoscopic procedure.  Patient ID and intended procedure confirmed with present staff. Received instructions for my participation in the procedure from the performing physician.  

## 2020-10-15 NOTE — Op Note (Signed)
[facility] Patient Name: Christy Lowe Procedure Date: 10/15/2020 3:39 PM MRN: [id number] Endoscopist: Mauri Pole , MD Age: 39 Referring MD:  Date of Birth: 1981/02/27 Gender: Female Account #: [id number] Procedure:                Upper GI endoscopy Indications:              Dysphagia, Epigastric abdominal pain, Odynophagia,                            Heartburn Medicines:                Monitored Anesthesia Care Procedure:                Pre-Anesthesia Assessment:                           - Prior to the procedure, a History and Physical                            was performed, and patient medications and                            allergies were reviewed. The patient's tolerance of                            previous anesthesia was also reviewed. The risks                            and benefits of the procedure and the sedation                            options and risks were discussed with the patient.                            All questions were answered, and informed consent                            was obtained. Prior Anticoagulants: The patient has                            taken no previous anticoagulant or antiplatelet                            agents. ASA Grade Assessment: II - A patient with                            mild systemic disease. After reviewing the risks                            and benefits, the patient was deemed in                            satisfactory condition to undergo the procedure.  After obtaining informed consent, the endoscope was                            passed under direct vision. Throughout the                            procedure, the patient's blood pressure, pulse, and                            oxygen saturations were monitored continuously. The                            Endoscope was introduced through the mouth, and                            advanced to the second part of  duodenum. The upper                            GI endoscopy was accomplished without difficulty.                            The patient tolerated the procedure well. Scope In: Scope Out: Findings:                 LA Grade B (one or more mucosal breaks greater than                            5 mm, not extending between the tops of two mucosal                            folds) esophagitis with no bleeding was found 34 to                            36 cm from the incisors. Biopsies were obtained                            from the proximal and distal esophagus with cold                            forceps for histology of suspected eosinophilic                            esophagitis.                           No endoscopic abnormality was evident in the                            esophagus to explain the patient's complaint of                            dysphagia. It was decided, however, to proceed with  dilation of the lower third of the esophagus. A TTS                            dilator was passed through the scope. Dilation with                            an 18-19-20 mm balloon dilator was performed to 20                            mm.                           The gastroesophageal flap valve was visualized                            endoscopically and classified as Hill Grade III                            (minimal fold, loose to endoscope, hiatal hernia                            likely).                           The stomach was normal other than mild prolpase                            gastropathy due to retching in proximal stomach                           The examined duodenum was normal. Complications:            No immediate complications. Estimated Blood Loss:     Estimated blood loss was minimal. Impression:               - LA Grade B reflux esophagitis with no bleeding.                            Biopsied.                           - No endoscopic  esophageal abnormality to explain                            patient's dysphagia. Esophagus dilated. Dilated.                           - Gastroesophageal flap valve classified as Hill                            Grade III (minimal fold, loose to endoscope, hiatal                            hernia likely).                           -  Normal stomach other than mild prolpase                            gastropathy due to retching in proximal stomach.                           - Normal examined duodenum. Recommendation:           - Patient has a contact number available for                            emergencies. The signs and symptoms of potential                            delayed complications were discussed with the                            patient. Return to normal activities tomorrow.                            Written discharge instructions were provided to the                            patient.                           - Resume previous diet.                           - Continue present medications.                           - Await pathology results.                           - Follow an antireflux regimen.                           - Use Prilosec (omeprazole) 20 mg PO BID.                           - Magic mouthwash 5cc q6h prn Mauri Pole, MD 10/15/2020 4:12:17 PM This report has been signed electronically.

## 2020-10-18 ENCOUNTER — Telehealth: Payer: Self-pay

## 2020-10-18 NOTE — Telephone Encounter (Signed)
°  Follow up Call-  Call back number 10/15/2020 03/11/2020 05/09/2019  Post procedure Call Back phone  # [phone]  Permission to leave phone message Yes Yes Yes  Some recent data might be hidden     Patient questions:  Do you have a fever, pain , or abdominal swelling? Yes.   Pain Score  4 *  Have you tolerated food without any problems? Yes.    Have you been able to return to your normal activities? Yes.    Do you have any questions about your discharge instructions: Diet   No. Medications  No. Follow up visit  No.  Do you have questions or concerns about your Care? No.  Actions: * If pain score is 4 or above: Physician/ provider Notified : Harl Bowie, MD.

## 2020-10-19 NOTE — Telephone Encounter (Signed)
Called patient back.  She had sore throat s/p EGD, is improving.  She is tolerating diet. She has chronic left-sided abdominal pain, is ongoing for the past 2 years.  Work-up so far including ultrasound and CT scan unrevealing for any acute pathology or etiology.  Advised patient to maintain food symptom diary for possible IBS related symptom, possible trapped gas, advised her to do a trial of Gas-X up to 3 times daily as needed.

## 2020-10-26 ENCOUNTER — Telehealth: Payer: Self-pay | Admitting: Gastroenterology

## 2020-10-28 NOTE — Telephone Encounter (Signed)
Her biopsy has not been released to the nurse yet.

## 2020-10-28 NOTE — Telephone Encounter (Signed)
Pt is requesting a call back from a nurse to discuss her results, pt states they have been released through [employer]

## 2020-11-01 ENCOUNTER — Encounter: Payer: Self-pay | Admitting: Gastroenterology

## 2020-11-02 NOTE — Progress Notes (Signed)
Reviewed and agree with documentation and assessment and plan. K. Veena Elige Shouse , MD   

## 2020-11-16 ENCOUNTER — Encounter: Payer: Self-pay | Admitting: Internal Medicine

## 2020-11-16 ENCOUNTER — Ambulatory Visit (INDEPENDENT_AMBULATORY_CARE_PROVIDER_SITE_OTHER): Payer: 59 | Admitting: Internal Medicine

## 2020-11-16 ENCOUNTER — Other Ambulatory Visit: Payer: Self-pay

## 2020-11-16 DIAGNOSIS — Z7189 Other specified counseling: Secondary | ICD-10-CM

## 2020-11-17 ENCOUNTER — Encounter: Payer: Self-pay | Admitting: Internal Medicine

## 2020-11-17 ENCOUNTER — Ambulatory Visit (INDEPENDENT_AMBULATORY_CARE_PROVIDER_SITE_OTHER): Payer: 59 | Admitting: Internal Medicine

## 2020-11-17 VITALS — BP 99/64 | HR 70 | Ht 67.0 in | Wt 185.0 lb

## 2020-11-17 DIAGNOSIS — Z86718 Personal history of other venous thrombosis and embolism: Secondary | ICD-10-CM

## 2020-11-17 DIAGNOSIS — L659 Nonscarring hair loss, unspecified: Secondary | ICD-10-CM | POA: Diagnosis not present

## 2020-11-17 DIAGNOSIS — R768 Other specified abnormal immunological findings in serum: Secondary | ICD-10-CM | POA: Diagnosis not present

## 2020-11-17 DIAGNOSIS — G35 Multiple sclerosis: Secondary | ICD-10-CM

## 2020-11-17 NOTE — Progress Notes (Signed)
Office Visit Note  Patient: Christy Lowe             Date of Birth: 01/25/1981           MRN: [id number]             PCP: [name], MD Referring: Marcial Pacas, MD Visit Date: 11/17/2020 Occupation: Horse show coordinator  Subjective:   History of Present Illness: Christy Lowe is a 39 y.o. female here for evaluation of positive ANA. She is noticing multiple symptoms including generalized alopecia, oral dryness, left sided neck swelling, joint pain and stiffness, position tremor, and episodic skin rashes.  She has had multiple ongoing problems since many years ago. She was hospitalized for viral meningitis at age 59 and feels she has been susceptible to infections ever since this time or before, with numerous cases of bronchitis and has had some lung nodules on CT imaging during these repeat episodes.  About 7 years ago she suffered from a RLE DVT provoked after right knee arthroscopy and developed right sided face and arm numbness. Workup indicated she had a TIA also noted to have PFO. She takes ASA since that time and has taken anticoagulation in perioperative periods.  Workup with MRI and LP studies were apparently also indicative for MS. She was treated with multiple rounds of steroids for months at a time and felt a lot of mood disturbance from these. She was never started on other immunomodulatory or imunosupressive treatments.   09/2020 ANA positive dsDNA 24 Vitamin D 27.9 ESR normal TSH normal  Activities of Daily Living:  Patient reports morning stiffness for 24 hours.   Patient Reports nocturnal pain.  Difficulty dressing/grooming: Denies Difficulty climbing stairs: Denies Difficulty getting out of chair: Denies Difficulty using hands for taps, buttons, cutlery, and/or writing: Reports  Review of Systems  Constitutional: Positive for fatigue.  HENT: Positive for mouth dryness. Negative for mouth sores and nose dryness.   Eyes: Positive for visual  disturbance. Negative for pain, itching and dryness.  Respiratory: Negative for cough, hemoptysis, shortness of breath and difficulty breathing.   Cardiovascular: Negative for chest pain, palpitations and swelling in legs/feet.  Gastrointestinal: Positive for abdominal pain and diarrhea. Negative for blood in stool and constipation.  Endocrine: Positive for increased urination.  Genitourinary: Negative for painful urination.  Musculoskeletal: Positive for arthralgias, joint pain, joint swelling, muscle weakness and morning stiffness. Negative for myalgias, muscle tenderness and myalgias.  Skin: Positive for rash and redness. Negative for color change.  Allergic/Immunologic: Positive for susceptible to infections.  Neurological: Negative for dizziness, numbness, headaches, memory loss and weakness.  Hematological: Negative for swollen glands.  Psychiatric/Behavioral: Positive for sleep disturbance. Negative for confusion.    PMFS History:  Patient Active Problem List   Diagnosis Date Noted  . Positive ANA (antinuclear antibody) 11/17/2020  . Alopecia 11/17/2020  . Other fatigue 10/12/2020  . Epigastric pain   . Nausea   . Vomiting without nausea   . Multiple sclerosis ([place]) 08/26/2019  . Migraine without aura   . History of DVT (deep vein thrombosis)   . History of endometriosis   . Right-sided muscle weakness 05/15/2019  . Abnormal finding on MRI of brain 05/15/2019  . 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 Mass of brain 12/24/2017  . Tremor 12/24/2017  . Numbness 12/24/2017  . Spinal cord disease ([place]) 12/24/2017  . Urinary incontinence 12/24/2017    Past Medical History:  Diagnosis Date  . 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 Dysmenorrhea   . GERD (gastroesophageal reflux disease)   . Heart murmur  PFO   . Hemangioma   . History of DVT (deep vein thrombosis)   . History of endometriosis   . Hypotension   . Migraine without aura   . MS (multiple sclerosis) ([place])   .  Multiple sclerosis ([place]) 08/26/2019  . Neuromuscular disorder (HCC)    Lupus  . Stroke [facility])    ages 77 and 14    Family History  Problem Relation Age of Onset  . Prostate cancer Paternal Grandfather   . Heart attack Paternal Grandfather   . Esophageal cancer Paternal Grandfather   . Breast cancer Other   . Alcohol abuse Mother   . Heart attack Father   . COPD Father   . Diabetes Maternal Grandmother   . Cervical cancer Maternal Grandmother   . Fibromyalgia Maternal Grandmother   . Prostate cancer Maternal Grandfather   . Heart attack Maternal Grandfather   . ALS Paternal Grandmother   . Colon cancer Neg Hx   . Stomach cancer Neg Hx   . Rectal cancer Neg Hx    Past Surgical History:  Procedure Laterality Date  . ANTERIOR CRUCIATE LIGAMENT REPAIR    . PELVIC LAPAROSCOPY     endometriosis  . TONSILLECTOMY     Social History   Social History Narrative  . Not on file   Immunization History  Administered Date(s) Administered  . PFIZER SARS-COV-2 Vaccination 06/27/2020, 07/28/2020  . PPD Test 01/23/2019     Objective: Vital Signs: BP 99/64 (BP Location: Left Arm, Patient Position: Sitting, Cuff Size: Normal)   Pulse 70   Ht '5\' 7"'  (1.702 m)   Wt 185 lb (83.9 kg)   BMI 28.98 kg/m    Physical Exam HENT:     Right Ear: External ear normal.     Left Ear: External ear normal.     Mouth/Throat:     Mouth: Mucous membranes are moist.     Pharynx: Oropharynx is clear.  Eyes:     Conjunctiva/sclera: Conjunctivae normal.  Cardiovascular:     Rate and Rhythm: Normal rate and regular rhythm.  Pulmonary:     Effort: Pulmonary effort is normal.     Breath sounds: Normal breath sounds.  Skin:    General: Skin is warm and dry.  Neurological:     General: No focal deficit present.     Mental Status: She is alert.     Comments: Positional tremor b/l  Psychiatric:        Mood and Affect: Mood normal.     Musculoskeletal Exam:  Neck full range of motion Shoulder,  elbow, wrist, fingers full range of motion no tenderness or swelling Normal hip internal and external rotation without pain Knees, ankles, MTPs full range of motion no tenderness or swelling   Investigation: No additional findings.  Imaging: No results found.  Recent Labs: Lab Results  Component Value Date   WBC 4.4 03/10/2020   HGB 13.8 03/10/2020   PLT 233 03/10/2020   NA 138 03/10/2020   K 4.0 03/10/2020   CL 104 03/10/2020   CO2 25 03/10/2020   GLUCOSE 91 03/10/2020   BUN 17 03/10/2020   CREATININE 0.85 03/10/2020   BILITOT 0.7 03/10/2020   ALKPHOS 64 03/10/2020   AST 20 03/10/2020   ALT 16 03/10/2020   PROT 7.3 03/10/2020   ALBUMIN 4.2 03/10/2020   CALCIUM 8.6 (L) 03/10/2020  GFRAA >60 03/10/2020    Speciality Comments: No specialty comments available.  Procedures:  No procedures performed Allergies: Cephalexin, Penicillins, Amitiza [lubiprostone], and Sulfa antibiotics   Assessment / Plan:     Visit Diagnoses: Positive ANA (antinuclear antibody) - Plan: C3 and C4, Beta-2 glycoprotein antibodies, Cardiolipin antibodies, IgG, IgM, IgA, Lupus Anticoagulant Eval w/Reflex, Protein / creatinine ratio, urine, Complement component H4I  Complicated history of multiple different problems with possibly increased rate of infections of varying type, inflammatory vs ischemic CNS changes at young ages.  Alopecia, fatigue, dry mouth, skin rash sounds atypical could be an urticarial vasculitis but has beeninfrequent. Does not present clear criteria for SLE but will check complements, APS abs, urine P/C ratio.  History of DVT (deep vein thrombosis)  Checking APS Abs titer. She hs been evaluated years ago without clear cause for hypercoagulable state identified.  Multiple sclerosis (HCC)  It sounds like neurology workup has been somewhat equivocal thus far. Not on any immunosuppressive treatment and only steroids in the past. No obvious neurological findings besides fine  positional termor today.  Orders: Orders Placed This Encounter  Procedures  . C3 and C4  . Beta-2 glycoprotein antibodies  . Cardiolipin antibodies, IgG, IgM, IgA  . Lupus Anticoagulant Eval w/Reflex  . Protein / creatinine ratio, urine  . Complement component c1q   No orders of the defined types were placed in this encounter.    Follow-Up Instructions: No follow-ups on file.   Collier Salina, MD  Note - This record has been created using [employer].  Chart creation errors have been sought, but may not always  have been located. Such creation errors do not reflect on  the standard of medical care.

## 2020-11-23 LAB — BETA-2 GLYCOPROTEIN ANTIBODIES
Beta-2 Glyco 1 IgA: <2 U/mL
Beta-2 Glyco 1 IgM: <2 U/mL
Beta-2 Glyco I IgG: <2 U/mL

## 2020-11-23 LAB — C3 AND C4
C3 Complement: 123 mg/dL (ref 83–193)
C4 Complement: 23 mg/dL (ref 15–57)

## 2020-11-23 LAB — PROTEIN / CREATININE RATIO, URINE
Creatinine, Urine: 154 mg/dL (ref 20–275)
Protein/Creat Ratio: 52 mg/g creat (ref 21–161)
Protein/Creatinine Ratio: 0.052 mg/mg creat (ref 0.021–0.16)
Total Protein, Urine: 8 mg/dL (ref 5–24)

## 2020-11-23 LAB — LUPUS ANTICOAGULANT EVAL W/ REFLEX
PTT-LA Screen: 33 s (ref <=40)
dRVVT: 39 s (ref <=45)

## 2020-11-23 LAB — CARDIOLIPIN ANTIBODIES, IGG, IGM, IGA
Anticardiolipin IgA: <2 APL-U/mL
Anticardiolipin IgG: <2 GPL-U/mL
Anticardiolipin IgM: <2 MPL-U/mL

## 2020-11-23 LAB — COMPLEMENT COMPONENT C1Q: Complement C1Q: 8.2 mg/dL (ref 5.0–8.6)

## 2020-11-28 NOTE — Progress Notes (Signed)
Discussion with patient and Jennell Corner, her significant other regarding her symptoms and work up in [place] thus far. Dr. Zannie Cove notes say she does not she meets criteria for MS. Patient does have tremor and it is noted today.She will be seeing Dr. Dimple Casey, Rheumatologist tomorrow as positive ANA was discovered by Neurologist recently. She is considering fundoplication surgery for reflux which she says is severe. She has a history of migraine headaches. Hematologist did not find inherited clotting disorder though patient has hx DVT. She is complaining of hair loss - TSH has been normal.

## 2021-04-01 ENCOUNTER — Other Ambulatory Visit: Payer: Self-pay

## 2021-04-01 ENCOUNTER — Encounter: Payer: Self-pay | Admitting: Internal Medicine

## 2021-04-01 ENCOUNTER — Ambulatory Visit: Payer: 59 | Admitting: Internal Medicine

## 2021-04-01 VITALS — BP 100/64 | HR 67 | Resp 14 | Ht 67.0 in | Wt 188.0 lb

## 2021-04-01 DIAGNOSIS — R768 Other specified abnormal immunological findings in serum: Secondary | ICD-10-CM

## 2021-04-01 DIAGNOSIS — M359 Systemic involvement of connective tissue, unspecified: Secondary | ICD-10-CM

## 2021-04-01 DIAGNOSIS — M25442 Effusion, left hand: Secondary | ICD-10-CM | POA: Insufficient documentation

## 2021-04-01 DIAGNOSIS — R200 Anesthesia of skin: Secondary | ICD-10-CM

## 2021-04-01 DIAGNOSIS — M329 Systemic lupus erythematosus, unspecified: Secondary | ICD-10-CM | POA: Insufficient documentation

## 2021-04-01 DIAGNOSIS — M797 Fibromyalgia: Secondary | ICD-10-CM | POA: Insufficient documentation

## 2021-04-01 MED ORDER — HYDROXYCHLOROQUINE SULFATE 200 MG PO TABS: 400.0000 mg | ORAL_TABLET | Freq: Every day | ORAL | 1 refills | Status: AC

## 2021-04-01 NOTE — Progress Notes (Signed)
Office Visit Note  Patient: Christy Lowe             Date of Birth: Mar 07, 1981           MRN: [id number]             PCP: [name], MD Referring: [name], MD Visit Date: 04/01/2021   Subjective:  Follow-up (Feet and toe numbness, turning blue, fatigue, not appetite, gained 25 pounds since 06/2020, left hand tingling and numbness, middle finger swelling)   History of Present Illness: Christy Lowe is a 40 y.o. female here for follow up for positive ANA with joint pain, dry eyes and mouth, skin rashes, episodic lymphadenopathy, and tremors. She has also had some neurologic workup concerning for MS without any long term immunosuppressive treatment. At initial visit about 6 months ago no specific CTD signs and symptoms seen. Specific ENA serologies were all negative.  Since last visit she has noticed some worsening in the episodic foot and toe discoloration and numbness worst in the right great toe.  She is also experienced increased swelling in her left third finger and fifth finger. She also feels extremely fatigued with poor appetite but has experienced significant weight gains since August of last year.   Review of Systems  Constitutional: Positive for fatigue.  HENT: Positive for mouth sores.   Eyes: Negative for dryness.  Respiratory: Negative for shortness of breath.   Cardiovascular: Positive for swelling in legs/feet.  Gastrointestinal: Negative for constipation.  Endocrine: Negative for excessive thirst.  Genitourinary: Negative for difficulty urinating.  Musculoskeletal: Positive for arthralgias, joint pain, joint swelling and morning stiffness.  Skin: Positive for rash.  Allergic/Immunologic: Positive for susceptible to infections.  Neurological: Positive for numbness and weakness.  Hematological: Positive for bruising/bleeding tendency.  Psychiatric/Behavioral: Positive for sleep disturbance.    PMFS History:  Patient Active Problem List   Diagnosis  Date Noted  . Finger joint swelling, left 04/01/2021  . 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 Fibromyalgia 04/01/2021  . Positive ANA (antinuclear antibody) 11/17/2020  . Alopecia 11/17/2020  . Other fatigue 10/12/2020  . Epigastric pain   . Multiple sclerosis ([place]) 08/26/2019  . Migraine without aura   . History of DVT (deep vein thrombosis)   . History of endometriosis   . Right-sided muscle weakness 05/15/2019  . Abnormal finding on MRI of brain 05/15/2019  . 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 Mass of brain 12/24/2017  . Tremor 12/24/2017  . Numbness 12/24/2017  . Spinal cord disease ([place]) 12/24/2017  . Urinary incontinence 12/24/2017    Past Medical History:  Diagnosis Date  . Clotting disorder (HCC) factor 5   . Dysmenorrhea   . GERD (gastroesophageal reflux disease)   . Heart murmur  PFO   . Hemangioma   . History of DVT (deep vein thrombosis)   . History of endometriosis   . Hypotension   . Migraine without aura   . MS (multiple sclerosis) ([place])   . Multiple sclerosis ([place]) 08/26/2019  . Neuromuscular disorder (HCC)    Lupus  . Stroke [facility])    ages 53 and 67    Family History  Problem Relation Age of Onset  . Prostate cancer Paternal Grandfather   . Heart attack Paternal Grandfather   . Esophageal cancer Paternal Grandfather   . Breast cancer Other   . Alcohol abuse Mother   . Heart attack Father   . COPD Father   . Diabetes  Maternal Grandmother   . Cervical cancer Maternal Grandmother   . Fibromyalgia Maternal Grandmother   . Prostate cancer Maternal Grandfather   . Heart attack Maternal Grandfather   . ALS Paternal Grandmother   . Colon cancer Neg Hx   . Stomach cancer Neg Hx   . Rectal cancer Neg Hx    Past Surgical History:  Procedure Laterality Date  . ANTERIOR CRUCIATE LIGAMENT REPAIR    . PELVIC LAPAROSCOPY     endometriosis  . TONSILLECTOMY     Social History   Social History Narrative  . Not on file    Immunization History  Administered Date(s) Administered  . PFIZER(Purple Top)SARS-COV-2 Vaccination 06/27/2020, 07/28/2020  . PPD Test 01/23/2019     Objective: Vital Signs: BP 100/64 (BP Location: Left Arm, Patient Position: Sitting, Cuff Size: Normal)   Pulse 67   Resp 14   Ht 5\' 7"  (1.702 m)   Wt 188 lb (85.3 kg)   BMI 29.44 kg/m    Physical Exam Eyes:     Conjunctiva/sclera: Conjunctivae normal.  Cardiovascular:     Rate and Rhythm: Normal rate and regular rhythm.  Pulmonary:     Effort: Pulmonary effort is normal.     Breath sounds: Normal breath sounds.  Neurological:     General: No focal deficit present.     Mental Status: She is alert.     Deep Tendon Reflexes: Reflexes normal.  Psychiatric:        Mood and Affect: Mood normal.     Musculoskeletal Exam:  Neck full ROM no tenderness Shoulders full ROM no tenderness or swelling Elbows full ROM no tenderness or swelling Wrists full ROM no tenderness or swelling Fingers full ROM, tenderness of left third MCP joint with soft tissue swelling at the volar surface of the proximal phalanx Knees full ROM no tenderness or swelling  Investigation: No additional findings.  Imaging: No results found.  Recent Labs: Lab Results  Component Value Date   WBC 4.4 03/10/2020   HGB 13.8 03/10/2020   PLT 233 03/10/2020   NA 138 03/10/2020   K 4.0 03/10/2020   CL 104 03/10/2020   CO2 25 03/10/2020   GLUCOSE 91 03/10/2020   BUN 17 03/10/2020   CREATININE 0.85 03/10/2020   BILITOT 0.7 03/10/2020   ALKPHOS 64 03/10/2020   AST 20 03/10/2020   ALT 16 03/10/2020   PROT 7.3 03/10/2020   ALBUMIN 4.2 03/10/2020   CALCIUM 8.6 (L) 03/10/2020   GFRAA >60 03/10/2020    Speciality Comments: No specialty comments available.  Procedures:  No procedures performed Allergies: Cephalexin, Penicillins, Amitiza [lubiprostone], and Sulfa antibiotics   Assessment / Plan:     Visit Diagnoses: Undifferentiated connective tissue  disease ([place]) - Plan: Anti-DNA antibody, double-stranded, C3 and C4, Sedimentation rate, Rheumatoid factor, hydroxychloroquine (PLAQUENIL) 200 MG tablet  Positive ANA previously with low positive double-stranded DNA antibodies although it was negative on 1 repeat test last year.  Currently exam does reveal localized inflammation at the left third digit flexor tendon at the proximal phalanx with some possible involvement around the A2 pulley.  She does not recall any type of injury associated with this to explain it so may represent a tenosynovitis.  Repeating double-stranded DNA complement and sed rate today as well as rheumatoid factor.  Discussed symptoms still are somewhat atypical but recommend starting hydroxychloroquine up to 400 mg daily for suspect an undifferentiated connective tissue disease.  Numbness - Plan: Ambulatory referral to Physical Medicine Rehab  Ongoing and worsening numbness and discoloration occurring in the great toe and feet.  Distribution is not typical for peripheral neuropathy.  Recommending referral for nerve conduction study  Fibromyalgia  Somewhat generalized symptoms and very persistent fatigue is suggestive for fibromyalgia syndrome which could develop secondary to chronic inflammatory or pain process.  We discussed this briefly but current ongoing work-up and treatment for inflammatory process.  Orders: Orders Placed This Encounter  Procedures  . Anti-DNA antibody, double-stranded  . C3 and C4  . Sedimentation rate  . Rheumatoid factor  . Ambulatory referral to Physical Medicine Rehab   Meds ordered this encounter  Medications  . hydroxychloroquine (PLAQUENIL) 200 MG tablet    Sig: Take 2 tablets (400 mg total) by mouth daily.    Dispense:  60 tablet    Refill:  1     Follow-Up Instructions: Return in about 2 months (around 06/01/2021) for ?UCTD new HCQ 8 wks f/u.   Collier Salina, MD  Note - This record has been created using [employer].   Chart creation errors have been sought, but may not always  have been located. Such creation errors do not reflect on  the standard of medical care.

## 2021-04-01 NOTE — Patient Instructions (Addendum)
Hydroxychloroquine tablets What is this medicine? HYDROXYCHLOROQUINE (hye drox ee KLOR oh kwin) is used to treat rheumatoid arthritis and systemic lupus erythematosus. It is also used to treat malaria. This medicine may be used for other purposes; ask your health care provider or pharmacist if you have questions. COMMON BRAND NAME(S): Plaquenil, Quineprox What should I tell my health care provider before I take this medicine? They need to know if you have any of these conditions:  diabetes  eye disease, vision problems  G6PD deficiency  heart disease  history of irregular heartbeat  if you often drink alcohol  kidney disease  liver disease  porphyria  psoriasis  an unusual or allergic reaction to chloroquine, hydroxychloroquine, other medicines, foods, dyes, or preservatives  pregnant or trying to get pregnant  breast-feeding How should I use this medicine? Take this medicine by mouth with a glass of water. Take it as directed on the prescription label. Do not cut, crush or chew this medicine. Swallow the tablets whole. Take it with food. Do not take it more than directed. Take all of this medicine unless your health care provider tells you to stop it early. Keep taking it even if you think you are better. Take products with antacids in them at a different time of day than this medicine. Take this medicine 4 hours before or 4 hours after antacids. Talk to your health care provider if you have questions. Talk to your pediatrician regarding the use of this medicine in children. While this drug may be prescribed for selected conditions, precautions do apply. Overdosage: If you think you have taken too much of this medicine contact a poison control center or emergency room at once. NOTE: This medicine is only for you. Do not share this medicine with others. What if I miss a dose? If you miss a dose, take it as soon as you can. If it is almost time for your next dose, take only  that dose. Do not take double or extra doses. What may interact with this medicine? Do not take this medicine with any of the following medications:  cisapride  dronedarone  pimozide  thioridazine This medicine may also interact with the following medications:  ampicillin  antacids  cimetidine  cyclosporine  digoxin  kaolin  medicines for diabetes, like insulin, glipizide, glyburide  medicines for seizures like carbamazepine, phenobarbital, phenytoin  mefloquine  methotrexate  other medicines that prolong the QT interval (cause an abnormal heart rhythm)  praziquantel This list may not describe all possible interactions. Give your health care provider a list of all the medicines, herbs, non-prescription drugs, or dietary supplements you use. Also tell them if you smoke, drink alcohol, or use illegal drugs. Some items may interact with your medicine. What should I watch for while using this medicine? Visit your health care provider for regular checks on your progress. Tell your health care provider if your symptoms do not start to get better or if they get worse. You may need blood work done while you are taking this medicine. If you take other medicines that can affect heart rhythm, you may need more testing. Talk to your health care provider if you have questions. Your vision may be tested before and during use of this medicine. Tell your health care provider right away if you have any change in your eyesight. This medicine may cause serious skin reactions. They can happen weeks to months after starting the medicine. Contact your health care provider right away if you  notice fevers or flu-like symptoms with a rash. The rash may be red or purple and then turn into blisters or peeling of the skin. Or, you might notice a red rash with swelling of the face, lips or lymph nodes in your neck or under your arms. If you or your family notice any changes in your behavior, such as new  or worsening depression, thoughts of harming yourself, anxiety, or other unusual or disturbing thoughts, or memory loss, call your health care provider right away. What side effects may I notice from receiving this medicine? Side effects that you should report to your doctor or health care professional as soon as possible:  allergic reactions (skin rash, itching or hives; swelling of the face, lips, or tongue)  changes in vision  decreased hearing, ringing in the ears  heartbeat rhythm changes (trouble breathing; chest pain; dizziness; fast, irregular heartbeat; feeling faint or lightheaded, falls)  liver injury (dark yellow or brown urine; general ill feeling or flu-like symptoms; loss of appetite, right upper belly pain; unusually weak or tired, yellowing of the eyes or skin)  low blood sugar (feeling anxious; confusion; dizziness; increased hunger; unusually weak or tired; increased sweating; shakiness; cold, clammy skin; irritable; headache; blurred vision; fast heartbeat; loss of consciousness)  low red blood cell counts (trouble breathing; feeling faint; lightheaded, falls; unusually weak or tired)  muscle weakness  pain, tingling, numbness in the hands or feet  rash, fever, and swollen lymph nodes  redness, blistering, peeling or loosening of the skin, including inside the mouth  suicidal thoughts, mood changes  uncontrollable head, mouth, neck, arm, or leg movements  unusual bruising or bleeding Side effects that usually do not require medical attention (report to your doctor or health care professional if they continue or are bothersome):  diarrhea  hair loss  irritable This list may not describe all possible side effects. Call your doctor for medical advice about side effects. You may report side effects to FDA at 1-800-FDA-1088. Where should I keep my medicine? Keep out of the reach of children and pets. Store at room temperature up to 30 degrees C (86 degrees F).  Protect from light. Get rid of any unused medicine after the expiration date. To get rid of medicines that are no longer needed or have expired:  Take the medicine to a medicine take-back program. Check with your pharmacy or law enforcement to find a location.  If you cannot return the medicine, check the label or package insert to see if the medicine should be thrown out in the garbage or flushed down the toilet. If you are not sure, ask your health care provider. If it is safe to put it in the trash, empty the medicine out of the container. Mix the medicine with cat litter, dirt, coffee grounds, or other unwanted substance. Seal the mixture in a bag or container. Put it in the trash.    Erythrocyte Sedimentation Rate Test Why am I having this test? The erythrocyte sedimentation rate (ESR) test is used to help find illnesses related to:  Sudden (acute) or long-term (chronic) infections.  Inflammation.  The body's disease-fighting system attacking healthy cells (autoimmune diseases).  Cancer.  Tissue death. If you have symptoms that may be related to any of these illnesses, your health care provider may do an ESR test before doing more specific tests. If you have an inflammatory immune disease, such as rheumatoid arthritis, you may have this test to help monitor your therapy. What is being  tested? This test measures how long it takes for your red blood cells (erythrocytes) to settle in a solution over a certain amount of time (sedimentation rate). When you have an infection or inflammation, your red blood cells clump together and settle faster. The sedimentation rate provides information about how much inflammation is present in the body. What kind of sample is taken? A blood sample is required for this test. It is usually collected by inserting a needle into a blood vessel.   How do I prepare for this test? Follow any instructions from your health care provider about changing or stopping  your regular medicines. Tell a health care provider about:  Any allergies you have.  All medicines you are taking, including vitamins, herbs, eye drops, creams, and over-the-counter medicines.  Any blood disorders you have.  Any surgeries you have had.  Any medical conditions you have, such as thyroid or kidney disease.  Whether you are pregnant or may be pregnant. How are the results reported? Your results will be reported as a value that measures sedimentation rate in millimeters per hour (mm/hr). Your health care provider will compare your results to normal ranges that were established after testing a large group of people (reference values). Reference values may vary among labs and hospitals. For this test, common reference values, which vary by age and gender, are:  Newborn: 0-2 mm/hr.  Child, up to puberty: 0-10 mm/hr.  Female: ? Under 50 years: 0-20 mm/hr. ? 50-85 years: 0-30 mm/hr. ? Over 85 years: 0-42 mm/hr.  Female: ? Under 50 years: 0-15 mm/hr. ? 50-85 years: 0-20 mm/hr. ? Over 85 years: 0-30 mm/hr. Certain conditions or medicines may cause ESR levels to be falsely lower or higher, such as:  Pregnancy.  Obesity.  Steroids, birth control pills, and blood thinners.  Thyroid or kidney disease. What do the results mean? Results that are within reference values are considered normal, meaning that the level of inflammation in your body is healthy. High ESR levels mean that there is inflammation in your body. You will have more tests to help make a diagnosis. Inflammation may result from many different conditions or injuries. Talk with your health care provider about what your results mean. Questions to ask your health care provider Ask your health care provider, or the department that is doing the test:  When will my results be ready?  How will I get my results?  What are my treatment options?  What other tests do I need?  What are my next  steps? Summary  The erythrocyte sedimentation rate (ESR) test is used to help find illnesses associated with sudden (acute) or long-term (chronic) infections, inflammation, autoimmune diseases, cancer, or tissue death.  If you have symptoms that may be related to any of these illnesses, your health care provider may do an ESR test before doing more specific tests. If you have an inflammatory immune disease, such as rheumatoid arthritis, you may have this test to help monitor your therapy.  This test measures how long it takes for your red blood cells (erythrocytes) to settle in a solution over a certain amount of time (sedimentation rate). This provides information about how much inflammation is present in the body.   Anti-DNA Antibody Test Why am I having this test? The anti-DNA antibody test helps with the diagnosis and follow-up of systemic lupus erythematosus (SLE). It is also used to monitor treatment of this condition as the antibody decreases with successful therapy. What is being tested? This  test measures the amount of anti-DNA antibody in the blood. This antibody is found in 65-80% of patients with active SLE. This antibody is not as common in patients who have other diseases. What kind of sample is taken? A blood sample is required for this test. It is usually collected by inserting a needle into a blood vessel.   How are the results reported? Your test results will be reported as a value. Your test results may also be reported as positive, intermediate, or negative. Your health care provider will compare your results to normal ranges that were established after testing a large group of people (reference values). Reference values may vary among labs and hospitals. For this test, common reference values are:  Positive: 10 or more international units/mL.  Intermediate: 5-9 international units/mL.  Negative: Less than 5 international units/mL. What do the results mean? Positive  results, which are associated with results that are higher than the reference values, may indicate:  Autoimmune disorders such as SLE.  Infectious mononucleosis.  Chronic liver conditions. Intermediate results mean that the anti-DNA antibody levels are higher than normal, but not high enough to be considered positive. Negative results mean that you do not have the anti-DNA antibody that is associated with these conditions. Talk with your health care provider about what your results mean. Questions to ask your health care provider Ask your health care provider, or the department that is doing the test:  When will my results be ready?  How will I get my results?  What are my treatment options?  What other tests do I need?  What are my next steps? Summary  The anti-DNA antibody test helps with the diagnosis and follow-up of systemic lupus erythematosus (SLE). It is also used to monitor treatment of this condition as the antibody decreases with successful therapy.  This test measures the amount of anti-DNA antibody in the blood.  Elevated levels of anti-DNA antibody can be seen in patients with SLE and certain other conditions. This information is not intended to replace advice given to you by your health care provider. Make sure you discuss any questions you have with your health care provider. Document Revised: 07/16/2020 Document Reviewed: 07/16/2020 Elsevier Patient Education  [place].

## 2021-04-04 LAB — C3 AND C4
C3 Complement: 129 mg/dL (ref 83–193)
C4 Complement: 24 mg/dL (ref 15–57)

## 2021-04-04 LAB — SEDIMENTATION RATE: Sed Rate: 9 mm/h (ref 0–20)

## 2021-04-04 LAB — RHEUMATOID FACTOR: Rheumatoid fact SerPl-aCnc: <14 IU/mL (ref <14)

## 2021-04-04 LAB — ANTI-DNA ANTIBODY, DOUBLE-STRANDED: ds DNA Ab: 24 IU/mL — ABNORMAL HIGH

## 2021-04-04 NOTE — Progress Notes (Signed)
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 Other tests including rheumatoid arthritis and inflammatory markers are negative. We can follow up to see if hydroxychloroquine is helping as planned.

## 2021-04-11 ENCOUNTER — Encounter (HOSPITAL_BASED_OUTPATIENT_CLINIC_OR_DEPARTMENT_OTHER): Payer: Self-pay

## 2021-04-11 ENCOUNTER — Telehealth: Payer: Self-pay

## 2021-04-11 ENCOUNTER — Emergency Department (HOSPITAL_BASED_OUTPATIENT_CLINIC_OR_DEPARTMENT_OTHER): Payer: 59

## 2021-04-11 ENCOUNTER — Emergency Department (HOSPITAL_BASED_OUTPATIENT_CLINIC_OR_DEPARTMENT_OTHER)
Admission: EM | Admit: 2021-04-11 | Discharge: 2021-04-11 | Disposition: A | Discharge status: Home / Self Care | Payer: 59 | Attending: Emergency Medicine | Admitting: Emergency Medicine

## 2021-04-11 ENCOUNTER — Other Ambulatory Visit: Payer: Self-pay

## 2021-04-11 DIAGNOSIS — M7989 Other specified soft tissue disorders: Secondary | ICD-10-CM

## 2021-04-11 DIAGNOSIS — M79604 Pain in right leg: Secondary | ICD-10-CM | POA: Diagnosis not present

## 2021-04-11 DIAGNOSIS — Z7982 Long term (current) use of aspirin: Secondary | ICD-10-CM | POA: Diagnosis not present

## 2021-04-11 DIAGNOSIS — R200 Anesthesia of skin: Secondary | ICD-10-CM | POA: Insufficient documentation

## 2021-04-11 LAB — CBC WITH DIFFERENTIAL/PLATELET
Abs Immature Granulocytes: 0.01 10*3/uL (ref 0.00–0.07)
Basophils Absolute: 0 10*3/uL (ref 0.0–0.1)
Basophils Relative: 1 %
Eosinophils Absolute: 0.1 10*3/uL (ref 0.0–0.5)
Eosinophils Relative: 2 %
HCT: 39.4 % (ref 36.0–46.0)
Hemoglobin: 13.2 g/dL (ref 12.0–15.0)
Immature Granulocytes: 0 %
Lymphocytes Relative: 35 %
Lymphs Abs: 2.2 10*3/uL (ref 0.7–4.0)
MCH: 30.2 pg (ref 26.0–34.0)
MCHC: 33.5 g/dL (ref 30.0–36.0)
MCV: 90.2 fL (ref 80.0–100.0)
Monocytes Absolute: 0.6 10*3/uL (ref 0.1–1.0)
Monocytes Relative: 9 %
Neutro Abs: 3.5 10*3/uL (ref 1.7–7.7)
Neutrophils Relative %: 53 %
Platelets: 256 10*3/uL (ref 150–400)
RBC: 4.37 MIL/uL (ref 3.87–5.11)
RDW: 12.3 % (ref 11.5–15.5)
WBC: 6.4 10*3/uL (ref 4.0–10.5)
nRBC: 0 % (ref 0.0–0.2)

## 2021-04-11 LAB — BASIC METABOLIC PANEL
Anion gap: 8 (ref 5–15)
BUN: 13 mg/dL (ref 6–20)
CO2: 26 mmol/L (ref 22–32)
Calcium: 8.8 mg/dL — ABNORMAL LOW (ref 8.9–10.3)
Chloride: 104 mmol/L (ref 98–111)
Creatinine, Ser: 0.87 mg/dL (ref 0.44–1.00)
GFR, Estimated: >60 mL/min (ref >60)
Glucose, Bld: 80 mg/dL (ref 70–99)
Potassium: 3.6 mmol/L (ref 3.5–5.1)
Sodium: 138 mmol/L (ref 135–145)

## 2021-04-11 NOTE — ED Provider Notes (Signed)
[facility] EMERGENCY DEPT Provider Note   CSN: [id number] Arrival date & time: 04/11/21  1817     History Chief Complaint  Patient presents with  . Cerebrovascular Accident  . Leg Swelling    Christy Lowe is a 40 y.o. female.  Patient is a 40 year old female with a history of MS, lupus on Plaquenil, stroke at ages 40 and 40, clotting disorder who has had prior DVTs but has been off of anticoagulation for 7 years and takes aspirin only as needed who is presenting today with multiple complaints.  Patient reports for the last 2 days she has had worsening swelling and pain in her right lower leg.  It is worse at the calf but then does radiate up into her medial thigh.  She reports a pins and needle sensation in her leg below the knee since the swelling has started.  She did take 2 aspirin yesterday and today but symptoms are worsening.  She does not use tobacco products takes no birth control and has had no recent immobilization, surgery or long travel.  She also reports starting yesterday she had some numbness and tingling in her right cheek but denies anything in the arm.  She has had no vision changes but did have 2 separate episodes of feeling like the world was spinning that lasted approximately 2 minutes over the last 2 days but is not currently present.  She has no neck pain, chest pain or shortness of breath.  The history is provided by the patient.  Cerebrovascular Accident       Past Medical History:  Diagnosis Date  . Clotting disorder (HCC) factor 5   . Dysmenorrhea   . GERD (gastroesophageal reflux disease)   . Heart murmur  PFO   . Hemangioma   . History of DVT (deep vein thrombosis)   . History of endometriosis   . Hypotension   . Migraine without aura   . MS (multiple sclerosis) ([place])   . Multiple sclerosis ([place]) 08/26/2019  . Neuromuscular disorder (HCC)    Lupus  . Stroke [facility])    ages 40 and 40    Patient Active Problem List   Diagnosis  Date Noted  . Finger joint swelling, left 04/01/2021  . 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 Fibromyalgia 04/01/2021  . Positive ANA (antinuclear antibody) 11/17/2020  . Alopecia 11/17/2020  . Other fatigue 10/12/2020  . Epigastric pain   . Multiple sclerosis ([place]) 08/26/2019  . Migraine without aura   . History of DVT (deep vein thrombosis)   . History of endometriosis   . Right-sided muscle weakness 05/15/2019  . Abnormal finding on MRI of brain 05/15/2019  . 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 Mass of brain 12/24/2017  . Tremor 12/24/2017  . Numbness 12/24/2017  . Spinal cord disease ([place]) 12/24/2017  . Urinary incontinence 12/24/2017    Past Surgical History:  Procedure Laterality Date  . ANTERIOR CRUCIATE LIGAMENT REPAIR    . PELVIC LAPAROSCOPY     endometriosis  . TONSILLECTOMY       OB History    Gravida  0   Para  0   Term  0   Preterm  0   AB  0   Living  0     SAB  0   IAB  0   Ectopic  0   Multiple  0   Live Births  0  Family History  Problem Relation Age of Onset  . Prostate cancer Paternal Grandfather   . Heart attack Paternal Grandfather   . Esophageal cancer Paternal Grandfather   . Breast cancer Other   . Alcohol abuse Mother   . Heart attack Father   . COPD Father   . Diabetes Maternal Grandmother   . Cervical cancer Maternal Grandmother   . Fibromyalgia Maternal Grandmother   . Prostate cancer Maternal Grandfather   . Heart attack Maternal Grandfather   . ALS Paternal Grandmother   . Colon cancer Neg Hx   . Stomach cancer Neg Hx   . Rectal cancer Neg Hx     Social History   Tobacco Use  . Smoking status: Never Smoker  . Smokeless tobacco: Never Used  Vaping Use  . Vaping Use: Never used  Substance Use Topics  . Alcohol use: Yes    Alcohol/week: 2.0 standard drinks    Types: 2 Standard drinks or equivalent per week    Comment: 2 drinks per week   . Drug use: Never     Home Medications Prior to Admission medications   Medication Sig Start Date End Date Taking? Authorizing Provider  aspirin 325 MG tablet Take 325 mg by mouth daily. Takes every day    [provider]  chlorhexidine (PERIDEX) 0.12 % solution as needed. 03/29/21   [provider]  hydroxychloroquine (PLAQUENIL) 200 MG tablet Take 2 tablets (400 mg total) by mouth daily. 04/01/21 05/31/21  Collier Salina, MD  magic mouthwash w/lidocaine SOLN Take 5 mLs by mouth 3 (three) times daily as needed. 10/15/20   Mauri Pole, MD  sucralfate (CARAFATE) 1 GM/10ML suspension Take 10 mLs (1 g total) by mouth 4 (four) times daily. 05/17/20   Mauri Pole, MD  VITAMIN D PO Take 1,000 Units by mouth daily.    [provider]    Allergies    Cephalexin, Penicillins, Amitiza [lubiprostone], and Sulfa antibiotics  Review of Systems   Review of Systems  All other systems reviewed and are negative.   Physical Exam Updated Vital Signs BP 126/77 (BP Location: Left Arm)   Pulse 76   Temp 98.9 F (37.2 C) (Oral)   Resp 16   Ht 5\' 7"  (1.702 m)   Wt 83.9 kg   SpO2 100%   BMI 28.98 kg/m   Physical Exam Vitals and nursing note reviewed.  Constitutional:      General: She is not in acute distress.    Appearance: Normal appearance. She is well-developed.  HENT:     Head: Normocephalic and atraumatic.     Mouth/Throat:     Mouth: Mucous membranes are moist.  Eyes:     Extraocular Movements: Extraocular movements intact.     Pupils: Pupils are equal, round, and reactive to light.  Cardiovascular:     Rate and Rhythm: Normal rate and regular rhythm.     Heart sounds: Normal heart sounds. No murmur heard. No friction rub.  Pulmonary:     Effort: Pulmonary effort is normal.     Breath sounds: Normal breath sounds. No wheezing or rales.  Abdominal:     General: Bowel sounds are normal. There is no distension.     Palpations: Abdomen is soft.     Tenderness:  There is no abdominal tenderness. There is no guarding or rebound.  Musculoskeletal:        General: No tenderness. Normal range of motion.     Right lower  leg: Edema present.     Comments: Nonpitting edema present in the right lower extremity below the knee.  Mild tenderness with palpation in the right calf.  2+ DP pulses  Skin:    General: Skin is warm and dry.     Findings: No rash.  Neurological:     Mental Status: She is alert and oriented to person, place, and time. Mental status is at baseline.     Cranial Nerves: No cranial nerve deficit.     Motor: No weakness.     Comments: Objective decreased sensation in the right lower extremity.  No right-sided facial droop.  No nystagmus.  Psychiatric:        Mood and Affect: Mood normal.        Behavior: Behavior normal.     ED Results / Procedures / Treatments   Labs (all labs ordered are listed, but only abnormal results are displayed) Labs Reviewed  BASIC METABOLIC PANEL - Abnormal; Notable for the following components:      Result Value   Calcium 8.8 (*)    All other components within normal limits  CBC WITH DIFFERENTIAL/PLATELET    EKG EKG Interpretation  Date/Time:  Monday Apr 11 2021 18:29:13 EDT Ventricular Rate:  68 PR Interval:  136 QRS Duration: 84 QT Interval:  360 QTC Calculation: 382 R Axis:   64 Text Interpretation: Normal sinus rhythm Normal ECG Confirmed by Blanchie Dessert [phone]) on 04/11/2021 6:35:00 PM   Radiology DG Chest 1 View  Result Date: 04/11/2021 CLINICAL DATA:  Right leg swelling EXAM: CHEST  1 VIEW COMPARISON:  None. FINDINGS: The heart size and mediastinal contours are within normal limits. Both lungs are clear. The visualized skeletal structures are unremarkable. IMPRESSION: No active disease. Electronically Signed   By: Donavan Foil M.D.   On: 04/11/2021 19:56   CT Head Wo Contrast  Result Date: 04/11/2021 CLINICAL DATA:  Right leg swelling and calf swelling and right facial weakness  EXAM: CT HEAD WITHOUT CONTRAST TECHNIQUE: Contiguous axial images were obtained from the base of the skull through the vertex without intravenous contrast. COMPARISON:  MRI 06/03/2019 FINDINGS: Brain: No evidence of acute infarction, hemorrhage, hydrocephalus, extra-axial collection or mass lesion/mass effect. Vascular: No hyperdense vessel or unexpected calcification. Skull: Normal. Negative for fracture or focal lesion. Sinuses/Orbits: Patchy mucosal thickening in the sinuses Other: None IMPRESSION: Negative non contrasted CT appearance of the brain Electronically Signed   By: Donavan Foil M.D.   On: 04/11/2021 19:56   US Venous Img Lower Right (DVT Study)  Result Date: 04/11/2021 CLINICAL DATA:  Right leg swelling EXAM: Right LOWER EXTREMITY VENOUS DOPPLER ULTRASOUND TECHNIQUE: Gray-scale sonography with compression, as well as color and duplex ultrasound, were performed to evaluate the deep venous system(s) from the level of the common femoral vein through the popliteal and proximal calf veins. COMPARISON:  None. FINDINGS: VENOUS Normal compressibility of the common femoral, superficial femoral, and popliteal veins, as well as the visualized calf veins. Visualized portions of profunda femoral vein and great saphenous vein unremarkable. No filling defects to suggest DVT on grayscale or color Doppler imaging. Doppler waveforms show normal direction of venous flow, normal respiratory plasticity and response to augmentation. Limited views of the contralateral common femoral vein are unremarkable. OTHER None. Limitations: none IMPRESSION: Negative. Electronically Signed   By: Donavan Foil M.D.   On: 04/11/2021 19:57    Procedures Procedures   Medications Ordered in ED Medications - No data to display  ED Course  I have reviewed the triage vital signs and the nursing notes.  Pertinent labs & imaging results that were available during my care of the patient were reviewed by me and considered in my  medical decision making (see chart for details).    MDM Rules/Calculators/A&P                          Patient with a history of multiple medical problems presenting today with pain and swelling in the right lower extremity.  She also has a pins and needle sensation in the leg.  She denies any chest pain or shortness of breath lower suspicion for PE.  She has had prior DVT and does have a known clotting disorder.  She has been off anticoagulation since 2015.  She does not take any medications that would increase her risk and does not use tobacco or have recent immobilization.  Symptoms started 2 days ago but she did notice yesterday also some tingling in the right side of her cheek.  She has no visual or speech difficulty.  No right upper extremity involvement.  She has had stroke in the past.  She denies any headache or visual changes but did have 2 episodes of brief vertigo that resolved in 2 minutes.  It was not caused by movement.  Patient denies any infectious symptoms.  No recent COVID infection.  Does have right lower extremity swelling on exam.  She has no tachycardia or hypoxia.  Labs, ultrasound and head CT are pending.  8:08 PM Head CT, LE U/S and CXR wnl.  No signs of clot.  CBC and BMP wnl.  However given pt's hx of stroke and her right sided symptoms without findings of clot pt will need MRI to r/o new stroke.  9:18 PM Test results with the patient.  At this time she does not wish to go to [facility] to get an MRI but would prefer to follow-up with Dr. Benjamine Mola and have an outpatient MRI scheduled.  Discussed with patient that we cannot completely rule out a new stroke.  She will continue 325 of aspirin for the time being.  She will also return to [facility] if her symptoms worsen.  Family member also present and is agreeable to this plan.  MDM Number of Diagnoses or Management Options   Amount and/or Complexity of Data Reviewed Clinical lab tests: ordered and reviewed Tests in the radiology section  of CPT: ordered and reviewed Tests in the medicine section of CPT: ordered and reviewed Independent visualization of images, tracings, or specimens: yes     Final Clinical Impression(s) / ED Diagnoses Final diagnoses:  Right leg pain  Right sided numbness    Rx / DC Orders ED Discharge Orders    None       Blanchie Dessert, MD 04/11/21 2119

## 2021-04-11 NOTE — ED Triage Notes (Signed)
Pt presents with R leg swelling, calf swelling, R side facial weakness, R side leg weakness, R side numbness and tingling. Pt states the last known normal was 5/13 before bed. Pt thinks she has a DVT in RLE. 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

## 2021-04-11 NOTE — ED Notes (Signed)
Pt is back to room  

## 2021-04-11 NOTE — Telephone Encounter (Signed)
Patient called stating her right leg began swelling on Saturday, 04/09/21 from her knee down to her ankle.  Patient states the swelling has increased and now her calf and ankle are twice the size of her left leg.  Patient states it feels tight and there is a spot that is warm to touch.  Patient requested a return call.

## 2021-04-11 NOTE — Discharge Instructions (Signed)
The ultrasound was negative for DVT.  The CAT scan was normal however an MRI is more detailed to definitively rule out stroke.  If your symptoms start to worsen you should go to [facility] to have an MRI done.  Otherwise please call your doctor tomorrow to have 1 set up as an outpatient.  Continue on the aspirin for now until more answers are given.  Elevate your leg to help with the swelling.

## 2021-04-11 NOTE — ED Notes (Signed)
Pt has been transported to CT.

## 2021-04-11 NOTE — Telephone Encounter (Signed)
Spoke with patient, advised, per Dr. Estanislado Pandy, patient should be evaluated by PCP or ED to r/o DVT. Patient is in agreement and plans to visit [place] to be evaluated.

## 2021-04-17 NOTE — Progress Notes (Deleted)
Office Visit Note  Patient: Christy Lowe             Date of Birth: May 27, 1981           MRN: [id number]             PCP: Collier Salina, MD Referring: Collier Salina, MD Visit Date: 04/18/2021   Subjective:  No chief complaint on file.   History of Present Illness: Christy Lowe is a 40 y.o. female here for follow up ***     No Rheumatology ROS completed.   PMFS History:  Patient Active Problem List   Diagnosis Date Noted  . Finger joint swelling, left 04/01/2021  . 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 Fibromyalgia 04/01/2021  . Positive ANA (antinuclear antibody) 11/17/2020  . Alopecia 11/17/2020  . Other fatigue 10/12/2020  . Epigastric pain   . Multiple sclerosis ([place]) 08/26/2019  . Migraine without aura   . History of DVT (deep vein thrombosis)   . History of endometriosis   . Right-sided muscle weakness 05/15/2019  . Abnormal finding on MRI of brain 05/15/2019  . 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 Mass of brain 12/24/2017  . Tremor 12/24/2017  . Numbness 12/24/2017  . Spinal cord disease ([place]) 12/24/2017  . Urinary incontinence 12/24/2017    Past Medical History:  Diagnosis Date  . Clotting disorder (HCC) factor 5   . Dysmenorrhea   . GERD (gastroesophageal reflux disease)   . Heart murmur  PFO   . Hemangioma   . History of DVT (deep vein thrombosis)   . History of endometriosis   . Hypotension   . Migraine without aura   . MS (multiple sclerosis) ([place])   . Multiple sclerosis ([place]) 08/26/2019  . Neuromuscular disorder (HCC)    Lupus  . Stroke [facility])    ages 53 and 57    Family History  Problem Relation Age of Onset  . Prostate cancer Paternal Grandfather   . Heart attack Paternal Grandfather   . Esophageal cancer Paternal Grandfather   . Breast cancer Other   . Alcohol abuse Mother   . Heart attack Father   . COPD Father   . Diabetes Maternal Grandmother   . Cervical cancer Maternal  Grandmother   . Fibromyalgia Maternal Grandmother   . Prostate cancer Maternal Grandfather   . Heart attack Maternal Grandfather   . ALS Paternal Grandmother   . Colon cancer Neg Hx   . Stomach cancer Neg Hx   . Rectal cancer Neg Hx    Past Surgical History:  Procedure Laterality Date  . ANTERIOR CRUCIATE LIGAMENT REPAIR    . PELVIC LAPAROSCOPY     endometriosis  . TONSILLECTOMY     Social History   Social History Narrative  . Not on file   Immunization History  Administered Date(s) Administered  . PFIZER(Purple Top)SARS-COV-2 Vaccination 06/27/2020, 07/28/2020  . PPD Test 01/23/2019     Objective: Vital Signs: There were no vitals taken for this visit.   Physical Exam   Musculoskeletal Exam: ***  CDAI Exam: CDAI Score: -- Patient Global: --; Provider Global: -- Swollen: --; Tender: -- Joint Exam 04/18/2021   No joint exam has been documented for this visit   There is currently no information documented on the homunculus. Go to the Rheumatology activity and complete the homunculus joint exam.  Investigation: No additional findings.  Imaging: DG Chest 1 View  Result Date: 04/11/2021 CLINICAL DATA:  Right  leg swelling EXAM: CHEST  1 VIEW COMPARISON:  None. FINDINGS: The heart size and mediastinal contours are within normal limits. Both lungs are clear. The visualized skeletal structures are unremarkable. IMPRESSION: No active disease. Electronically Signed   By: Donavan Foil M.D.   On: 04/11/2021 19:56   CT Head Wo Contrast  Result Date: 04/11/2021 CLINICAL DATA:  Right leg swelling and calf swelling and right facial weakness EXAM: CT HEAD WITHOUT CONTRAST TECHNIQUE: Contiguous axial images were obtained from the base of the skull through the vertex without intravenous contrast. COMPARISON:  MRI 06/03/2019 FINDINGS: Brain: No evidence of acute infarction, hemorrhage, hydrocephalus, extra-axial collection or mass lesion/mass effect. Vascular: No hyperdense vessel  or unexpected calcification. Skull: Normal. Negative for fracture or focal lesion. Sinuses/Orbits: Patchy mucosal thickening in the sinuses Other: None IMPRESSION: Negative non contrasted CT appearance of the brain Electronically Signed   By: Donavan Foil M.D.   On: 04/11/2021 19:56   US Venous Img Lower Right (DVT Study)  Result Date: 04/11/2021 CLINICAL DATA:  Right leg swelling EXAM: Right LOWER EXTREMITY VENOUS DOPPLER ULTRASOUND TECHNIQUE: Gray-scale sonography with compression, as well as color and duplex ultrasound, were performed to evaluate the deep venous system(s) from the level of the common femoral vein through the popliteal and proximal calf veins. COMPARISON:  None. FINDINGS: VENOUS Normal compressibility of the common femoral, superficial femoral, and popliteal veins, as well as the visualized calf veins. Visualized portions of profunda femoral vein and great saphenous vein unremarkable. No filling defects to suggest DVT on grayscale or color Doppler imaging. Doppler waveforms show normal direction of venous flow, normal respiratory plasticity and response to augmentation. Limited views of the contralateral common femoral vein are unremarkable. OTHER None. Limitations: none IMPRESSION: Negative. Electronically Signed   By: Donavan Foil M.D.   On: 04/11/2021 19:57    Recent Labs: Lab Results  Component Value Date   WBC 6.4 04/11/2021   HGB 13.2 04/11/2021   PLT 256 04/11/2021   NA 138 04/11/2021   K 3.6 04/11/2021   CL 104 04/11/2021   CO2 26 04/11/2021   GLUCOSE 80 04/11/2021   BUN 13 04/11/2021   CREATININE 0.87 04/11/2021   BILITOT 0.7 03/10/2020   ALKPHOS 64 03/10/2020   AST 20 03/10/2020   ALT 16 03/10/2020   PROT 7.3 03/10/2020   ALBUMIN 4.2 03/10/2020   CALCIUM 8.8 (L) 04/11/2021   GFRAA >60 03/10/2020    Speciality Comments: No specialty comments available.  Procedures:  No procedures performed Allergies: Cephalexin, Penicillins, Amitiza [lubiprostone], and  Sulfa antibiotics   Assessment / Plan:     Visit Diagnoses: No diagnosis found.  ***  Orders: No orders of the defined types were placed in this encounter.  No orders of the defined types were placed in this encounter.    Follow-Up Instructions: No follow-ups on file.   Collier Salina, MD  Note - This record has been created using [employer].  Chart creation errors have been sought, but may not always  have been located. Such creation errors do not reflect on  the standard of medical care.

## 2021-04-18 ENCOUNTER — Telehealth: Payer: Self-pay | Admitting: Radiology

## 2021-04-18 ENCOUNTER — Ambulatory Visit: Payer: 59 | Admitting: Internal Medicine

## 2021-04-18 DIAGNOSIS — R768 Other specified abnormal immunological findings in serum: Secondary | ICD-10-CM

## 2021-04-18 NOTE — Telephone Encounter (Signed)
Spoke with patient- she was evaluated in ED on 04/11/2021 due to calf swelling, pain, redness, tightness. ED provider advised patient to have brain MRI due to h/o strokes, but they were unable to order the MRI. Please review the notes from ED and advise how patient should best move forward.   Patient does not currently have PCP, referral has been placed.

## 2021-04-19 ENCOUNTER — Encounter: Payer: Self-pay | Admitting: Physical Medicine and Rehabilitation

## 2021-04-19 ENCOUNTER — Other Ambulatory Visit: Payer: Self-pay

## 2021-04-19 ENCOUNTER — Ambulatory Visit: Payer: 59 | Admitting: Physical Medicine and Rehabilitation

## 2021-04-19 DIAGNOSIS — R202 Paresthesia of skin: Secondary | ICD-10-CM

## 2021-04-19 NOTE — Progress Notes (Signed)
Numbness in right heel. Toes on right foot are "purple." Swelling.  Lotion Numeric Pain Rating Scale and Functional Assessment Average Pain 3   In the last MONTH (on 0-10 scale) has pain interfered with the following?  1. General activity like being  able to carry out your everyday physical activities such as walking, climbing stairs, carrying groceries, or moving a chair?  Rating(10)

## 2021-04-19 NOTE — Procedures (Signed)
EMG & NCV Findings: All nerve conduction studies (as indicated in the following tables) were within normal limits.    All examined muscles (as indicated in the following table) showed no evidence of electrical instability.    Impression: Essentially NORMAL electrodiagnostic study of the right lower limb.  There is no significant electrodiagnostic evidence of nerve entrapment, lumbar radiculopathy, lumbosacral plexopathy or generalized peripheral neuropathy.    As you know, purely sensory or demyelinating radiculopathies and chemical radiculitis may not be detected with this particular electrodiagnostic study. **This electrodiagnostic study cannot rule out small fiber polyneuropathy and dysesthesias from central pain syndromes such as stroke or central pain sensitization syndromes such as fibromyalgia.  Myotomal referral pain from trigger points is also not excluded. Lastly, she does not meet the [place] criteria for complex regional pain syndrome (CRPS).  Recommendations: 1.  Follow-up with referring physician. 2.  Continue current management of symptoms.  If felt to be radicular then updated MRI of the lumbar spine.  Consider referral to foot and ankle specialist.  ___________________________ [facility] Board Certified, American Board of Physical Medicine and Rehabilitation    Nerve Conduction Studies Anti Sensory Summary Table   Stim Site NR Peak (ms) Norm Peak (ms) P-T Amp (V) Norm P-T Amp Site1 Site2 Delta-P (ms) Dist (cm) Vel (m/s) Norm Vel (m/s)  Right Saphenous Anti Sensory (Ant Med Mall)  29.3C  14cm    3.0 <4.4 10.2 >2 14cm Ant Med Mall 3.0 0.0  >32  Right Sup Fibular Anti Sensory (Ant Lat Mall)  29.3C  14 cm    3.1 <4.4 6.5 >5.0 14 cm Ant Lat Mall 3.1 14.0 45 >32  Right Sural Anti Sensory (Lat Mall)  28.8C  Calf    2.6 <4.0 9.0 >5.0 Calf Lat Mall 2.6 14.0 54 >35   Motor Summary Table   Stim Site NR Onset (ms) Norm Onset (ms) O-P Amp (mV) Norm O-P Amp Site1  Site2 Delta-0 (ms) Dist (cm) Vel (m/s) Norm Vel (m/s)  Right Fibular Motor (Ext Dig Brev)  29.8C  Ankle    3.4 <6.1 8.1 >2.5 B Fib Ankle 6.6 32.0 48 >38  B Fib    10.0  3.1  Poplt B Fib 1.4 10.0 71 >40  Poplt    11.4  6.7         Right Tibial Motor (Abd Hall Brev)  30.5C  Ankle    6.0 <6.1 12.1 >3.0 Knee Ankle 4.7 38.0 81 >35  Knee    10.7  6.4          EMG   Side Muscle Nerve Root Ins Act Fibs Psw Amp Dur Poly Recrt Int Fraser Din Comment  Right AntTibialis Dp Br Peron L4-5 Nml Nml Nml Nml Nml 0 Nml Nml   Right Fibularis Longus  Sup Br Peron L5-S1 Nml Nml Nml Nml Nml 0 Nml Nml   Right MedGastroc Tibial S1-2 Nml Nml Nml Nml Nml 0 Nml Nml   Right VastusMed Femoral L2-4 Nml Nml Nml Nml Nml 0 Nml Nml   Right BicepsFemS Sciatic L5-S1 Nml Nml Nml Nml Nml 0 Nml Nml     Nerve Conduction Studies Anti Sensory Left/Right Comparison   Stim Site L Lat (ms) R Lat (ms) L-R Lat (ms) L Amp (V) R Amp (V) L-R Amp (%) Site1 Site2 L Vel (m/s) R Vel (m/s) L-R Vel (m/s)  Saphenous Anti Sensory (Ant Med Mall)  29.3C  14cm  3.0   10.2  14cm Ant Med Mall  Sup Fibular Anti Sensory (Ant Lat Mall)  29.3C  14 cm  3.1   6.5  14 cm Ant Lat Mall  45   Sural Anti Sensory (Lat Mall)  28.8C  Calf  2.6   9.0  Calf Lat Mall  54    Motor Left/Right Comparison   Stim Site L Lat (ms) R Lat (ms) L-R Lat (ms) L Amp (mV) R Amp (mV) L-R Amp (%) Site1 Site2 L Vel (m/s) R Vel (m/s) L-R Vel (m/s)  Fibular Motor (Ext Dig Brev)  29.8C  Ankle  3.4   8.1  B Fib Ankle  48   B Fib  10.0   3.1  Poplt B Fib  71   Poplt  11.4   6.7        Tibial Motor (Abd Hall Brev)  30.5C  Ankle  6.0   12.1  Knee Ankle  81   Knee  10.7   6.4           Waveforms:

## 2021-04-19 NOTE — Progress Notes (Signed)
Christy Lowe - 40 y.o. female MRN [id number]  Date of birth: Mar 17, 1981  Office Visit Note: Visit Date: 04/19/2021 PCP: Collier Salina, MD Referred by: Collier Salina, MD  Subjective: Chief Complaint  Patient presents with  . Right Foot - Numbness, Edema   HPI:  Christy Lowe is a 40 y.o. female who comes in today at the request of Dr. Vernelle Emerald for electrodiagnostic study of the Right lower extremity.  She reports an interesting medical history of various conditions including what was thought to be multiple sclerosis and then determined probably not to be multiple sclerosis and now being treated for probable lupus by Dr. Benjamine Mola.  She has been having several months of right lower extremity pain with paresthesias particularly in the anterior lateral calf with color changes in the first and second digit.  She reports swelling in the foot as well.  She gets numbness in the right heel.  She denies any frank radicular symptoms does have some back pain at times.  No left-sided complaints.  And all of her neurologic evaluations has not had electrodiagnostic study.  No recent MRI of the lumbar spine.  Has had history of DVT.  Also reports bilateral tremor in the hands.   ROS Otherwise per HPI.  Assessment & Plan: Visit Diagnoses:    ICD-10-CM   1. Paresthesia of skin  R20.2 NCV with EMG (electromyography)    Plan: Impression: Essentially NORMAL electrodiagnostic study of the right lower limb.  There is no significant electrodiagnostic evidence of nerve entrapment, lumbar radiculopathy, lumbosacral plexopathy or generalized peripheral neuropathy.    As you know, purely sensory or demyelinating radiculopathies and chemical radiculitis may not be detected with this particular electrodiagnostic study. **This electrodiagnostic study cannot rule out small fiber polyneuropathy and dysesthesias from central pain syndromes such as stroke or central pain sensitization syndromes such  as fibromyalgia.  Myotomal referral pain from trigger points is also not excluded. Lastly, she does not meet the [place] criteria for complex regional pain syndrome (CRPS).  Recommendations: 1.  Follow-up with referring physician. 2.  Continue current management of symptoms.  If felt to be radicular then updated MRI of the lumbar spine.  Consider referral to foot and ankle specialist. Meds & Orders: No orders of the defined types were placed in this encounter.   Orders Placed This Encounter  Procedures  . NCV with EMG (electromyography)    Follow-up: Return for Vernelle Emerald, MD.   Procedures: No procedures performed  EMG & NCV Findings: All nerve conduction studies (as indicated in the following tables) were within normal limits.    All examined muscles (as indicated in the following table) showed no evidence of electrical instability.    Impression: Essentially NORMAL electrodiagnostic study of the right lower limb.  There is no significant electrodiagnostic evidence of nerve entrapment, lumbar radiculopathy, lumbosacral plexopathy or generalized peripheral neuropathy.    As you know, purely sensory or demyelinating radiculopathies and chemical radiculitis may not be detected with this particular electrodiagnostic study. **This electrodiagnostic study cannot rule out small fiber polyneuropathy and dysesthesias from central pain syndromes such as stroke or central pain sensitization syndromes such as fibromyalgia.  Myotomal referral pain from trigger points is also not excluded. Lastly, she does not meet the [place] criteria for complex regional pain syndrome (CRPS).  Recommendations: 1.  Follow-up with referring physician. 2.  Continue current management of symptoms.  If felt to be radicular then updated MRI of the lumbar spine.  Consider  referral to foot and ankle specialist.  ___________________________ [facility] Board Certified, American Board of Physical Medicine  and Rehabilitation    Nerve Conduction Studies Anti Sensory Summary Table   Stim Site NR Peak (ms) Norm Peak (ms) P-T Amp (V) Norm P-T Amp Site1 Site2 Delta-P (ms) Dist (cm) Vel (m/s) Norm Vel (m/s)  Right Saphenous Anti Sensory (Ant Med Mall)  29.3C  14cm    3.0 <4.4 10.2 >2 14cm Ant Med Mall 3.0 0.0  >32  Right Sup Fibular Anti Sensory (Ant Lat Mall)  29.3C  14 cm    3.1 <4.4 6.5 >5.0 14 cm Ant Lat Mall 3.1 14.0 45 >32  Right Sural Anti Sensory (Lat Mall)  28.8C  Calf    2.6 <4.0 9.0 >5.0 Calf Lat Mall 2.6 14.0 54 >35   Motor Summary Table   Stim Site NR Onset (ms) Norm Onset (ms) O-P Amp (mV) Norm O-P Amp Site1 Site2 Delta-0 (ms) Dist (cm) Vel (m/s) Norm Vel (m/s)  Right Fibular Motor (Ext Dig Brev)  29.8C  Ankle    3.4 <6.1 8.1 >2.5 B Fib Ankle 6.6 32.0 48 >38  B Fib    10.0  3.1  Poplt B Fib 1.4 10.0 71 >40  Poplt    11.4  6.7         Right Tibial Motor (Abd Hall Brev)  30.5C  Ankle    6.0 <6.1 12.1 >3.0 Knee Ankle 4.7 38.0 81 >35  Knee    10.7  6.4          EMG   Side Muscle Nerve Root Ins Act Fibs Psw Amp Dur Poly Recrt Int Fraser Din Comment  Right AntTibialis Dp Br Peron L4-5 Nml Nml Nml Nml Nml 0 Nml Nml   Right Fibularis Longus  Sup Br Peron L5-S1 Nml Nml Nml Nml Nml 0 Nml Nml   Right MedGastroc Tibial S1-2 Nml Nml Nml Nml Nml 0 Nml Nml   Right VastusMed Femoral L2-4 Nml Nml Nml Nml Nml 0 Nml Nml   Right BicepsFemS Sciatic L5-S1 Nml Nml Nml Nml Nml 0 Nml Nml     Nerve Conduction Studies Anti Sensory Left/Right Comparison   Stim Site L Lat (ms) R Lat (ms) L-R Lat (ms) L Amp (V) R Amp (V) L-R Amp (%) Site1 Site2 L Vel (m/s) R Vel (m/s) L-R Vel (m/s)  Saphenous Anti Sensory (Ant Med Mall)  29.3C  14cm  3.0   10.2  14cm Ant Med Mall     Sup Fibular Anti Sensory (Ant Lat Mall)  29.3C  14 cm  3.1   6.5  14 cm Ant Lat Mall  45   Sural Anti Sensory (Lat Mall)  28.8C  Calf  2.6   9.0  Calf Lat Mall  54    Motor Left/Right Comparison   Stim Site L Lat (ms) R Lat  (ms) L-R Lat (ms) L Amp (mV) R Amp (mV) L-R Amp (%) Site1 Site2 L Vel (m/s) R Vel (m/s) L-R Vel (m/s)  Fibular Motor (Ext Dig Brev)  29.8C  Ankle  3.4   8.1  B Fib Ankle  48   B Fib  10.0   3.1  Poplt B Fib  71   Poplt  11.4   6.7        Tibial Motor (Abd Hall Brev)  30.5C  Ankle  6.0   12.1  Knee Ankle  81   Knee  10.7   6.4  Waveforms:             Clinical History: No specialty comments available.     Objective:  VS:  HT:    WT:   BMI:     BP:   HR: bpm  TEMP: ( )  RESP:  Physical Exam Vitals and nursing note reviewed.  Constitutional:      General: She is not in acute distress.    Appearance: Normal appearance. She is obese. She is not ill-appearing.  HENT:     Head: Normocephalic and atraumatic.     Right Ear: External ear normal.     Left Ear: External ear normal.  Eyes:     Extraocular Movements: Extraocular movements intact.  Cardiovascular:     Rate and Rhythm: Normal rate.     Pulses: Normal pulses.  Pulmonary:     Effort: Pulmonary effort is normal. No respiratory distress.  Abdominal:     General: There is no distension.     Palpations: Abdomen is soft.  Musculoskeletal:        General: Tenderness present. No swelling or signs of injury.     Cervical back: Neck supple.     Right lower leg: Edema present.     Left lower leg: No edema.     Comments: Patient has good distal strength with normal bilateral dorsiflexion, plantarflexion and EHL.  I do not see any discoloration today.  There is a mild bit of edema on the right foot and lower ankle compared to left.  There are no signs of increased sweating or temperature change.  There is no allodynia or hyperesthesia.  There is normal muscle bulk bilaterally in the calves and foot intrinsic muscles.  Skin:    Findings: No erythema, lesion or rash.  Neurological:     General: No focal deficit present.     Mental Status: She is alert and oriented to person, place, and time.     Cranial Nerves:  No cranial nerve deficit.     Sensory: No sensory deficit.     Motor: No weakness or abnormal muscle tone.     Coordination: Coordination normal.     Gait: Gait normal.  Psychiatric:        Mood and Affect: Mood normal.        Behavior: Behavior normal.      Imaging: No results found.

## 2021-04-20 ENCOUNTER — Encounter: Payer: Self-pay | Admitting: Internal Medicine

## 2021-04-20 MED ORDER — PREDNISONE 10 MG PO TABS: 30.0000 mg | ORAL_TABLET | Freq: Every day | ORAL | 0 refills | Status: DC

## 2021-04-20 NOTE — Telephone Encounter (Signed)
Addressed in phone message encounter from earlier this week.

## 2021-04-20 NOTE — Telephone Encounter (Signed)
FYI- I spoke with Ms. Gearing she is having swelling and discoloration in her right leg distally and now increased in the hand joints feel 'like they have sand in them.' Symptoms are suggestive for lupus with these more objective changes, history of clots, and her dsDNA labs persistently positive. We can try additional medicine besides just HCQ but for short term I recommended she try a course of prednisone see if symptoms come under control within next few days. She has taken steroids before with irritability and stomach upset side effects but agrees with trying this first. Will need to follow up at least by phone in a few days for plan going forwards.

## 2021-05-12 ENCOUNTER — Other Ambulatory Visit: Payer: Self-pay

## 2021-05-12 ENCOUNTER — Encounter: Payer: Self-pay | Admitting: Internal Medicine

## 2021-05-12 ENCOUNTER — Ambulatory Visit: Payer: 59 | Admitting: Internal Medicine

## 2021-05-12 VITALS — BP 104/69 | HR 73 | Ht 67.0 in | Wt 188.4 lb

## 2021-05-12 DIAGNOSIS — M359 Systemic involvement of connective tissue, unspecified: Secondary | ICD-10-CM | POA: Diagnosis not present

## 2021-05-12 DIAGNOSIS — M25571 Pain in right ankle and joints of right foot: Secondary | ICD-10-CM | POA: Insufficient documentation

## 2021-05-12 DIAGNOSIS — M797 Fibromyalgia: Secondary | ICD-10-CM

## 2021-05-12 DIAGNOSIS — R1013 Epigastric pain: Secondary | ICD-10-CM

## 2021-05-12 MED ORDER — PREDNISONE 10 MG PO TABS: 10.0000 mg | ORAL_TABLET | Freq: Every day | ORAL | 0 refills | Status: DC

## 2021-05-12 NOTE — Progress Notes (Signed)
Office Visit Note  Patient: Christy Lowe             Date of Birth: 06-20-1981           MRN: [id number]             PCP: Collier Salina, MD Referring: Collier Salina, MD Visit Date: 05/12/2021   Subjective:  Follow-up (Patient complains of continued right lower extremity pain and swelling- was evaluated in the ED on 04/11/2021. Patient complains of continued hand pain. Patient has been taking PLQ X 1 month (approximately) and does not notice improvement. )   History of Present Illness: Christy Lowe is a 40 y.o. female here for follow up for undifferentiated connective tissue disease with ongoing severe fatigue, joint pains, and most recently the right calf and ankle pain that has been continuing since about a month ago. This started abruptly without any provoking injury that she noticed. Initial ED visit was unrevealing, we discussed symptoms by phone and trial or prednisone she felt helped her joints feel improved, previously having a sensation as if full of sand and pain with movement or weight bearing. Pain in the calf is posterior but ankle pain is anterior and all on right side. Swelling is still present but less severe and no discoloration compared to initial symptoms, she describes intermittent discoloration of the great toe. She also still feels diffuse symptoms with the pain and stiffness in her hands still just as bad. She has not noticed any improvement since starting the hydroxychloroquine medication for a month. She saw Dr. Ernestina Patches for nerve conduction study this was unremarkable for evidence of neuropathy.   Review of Systems  Constitutional:  Positive for fatigue.  HENT:  Positive for mouth sores and mouth dryness. Negative for nose dryness.   Eyes:  Positive for visual disturbance. Negative for pain, itching and dryness.  Respiratory:  Negative for cough, hemoptysis, shortness of breath and difficulty breathing.   Cardiovascular:  Positive for swelling in  legs/feet. Negative for chest pain and palpitations.  Gastrointestinal:  Positive for diarrhea. Negative for abdominal pain, blood in stool and constipation.  Endocrine: Negative for increased urination.  Genitourinary:  Negative for painful urination.  Musculoskeletal:  Positive for joint pain, joint pain, joint swelling, myalgias, morning stiffness and myalgias. Negative for muscle weakness and muscle tenderness.  Skin:  Positive for color change, rash and redness.  Allergic/Immunologic: Negative for susceptible to infections.  Neurological:  Positive for numbness, headaches and weakness. Negative for dizziness and memory loss.  Hematological:  Negative for swollen glands.  Psychiatric/Behavioral:  Negative for confusion and sleep disturbance.    PMFS History:  Patient Active Problem List   Diagnosis Date Noted   Pain in right ankle and joints of right foot 05/12/2021   Finger joint swelling, left 04/01/2021   Undifferentiated connective tissue disease ([place]) 04/01/2021   Fibromyalgia 04/01/2021   Positive ANA (antinuclear antibody) 11/17/2020   Alopecia 11/17/2020   Other fatigue 10/12/2020   Epigastric pain    Multiple sclerosis ([place]) 08/26/2019   Migraine without aura    History of DVT (deep vein thrombosis)    History of endometriosis    Right-sided muscle weakness 05/15/2019   Abnormal finding on MRI of brain 05/15/2019   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   Mass of brain 12/24/2017   Tremor 12/24/2017   Numbness 12/24/2017   Spinal cord disease ([place]) 12/24/2017   Urinary incontinence 12/24/2017    Past Medical History:  Diagnosis Date   Clotting disorder (HCC) factor 5    Dysmenorrhea    GERD (gastroesophageal reflux disease)    Heart murmur  PFO    Hemangioma    History of DVT (deep vein thrombosis)    History of endometriosis    Hypotension    Migraine without aura    MS (multiple sclerosis) (HCC)    Multiple sclerosis ([place]) 08/26/2019   Neuromuscular disorder  ([place])    Lupus   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7    Family History  Problem Relation Age of Onset   Prostate cancer Paternal Grandfather    Heart attack Paternal Grandfather    Esophageal cancer Paternal Grandfather    Breast cancer Other    Alcohol abuse Mother    Heart attack Father    COPD Father    Diabetes Maternal Grandmother    Cervical cancer Maternal Grandmother    Fibromyalgia Maternal Grandmother    Prostate cancer Maternal Grandfather    Heart attack Maternal Grandfather    ALS Paternal Grandmother    Colon cancer Neg Hx    Stomach cancer Neg Hx    Rectal cancer Neg Hx    Past Surgical History:  Procedure Laterality Date   ANTERIOR CRUCIATE LIGAMENT REPAIR     PELVIC LAPAROSCOPY     endometriosis   TONSILLECTOMY     Social History   Social History Narrative   Not on file   Immunization History  Administered Date(s) Administered   PFIZER(Purple Top)SARS-COV-2 Vaccination 06/27/2020, 07/28/2020   PPD Test 01/23/2019     Objective: Vital Signs: BP 104/69 (BP Location: Left Arm, Patient Position: Sitting, Cuff Size: Normal)   Pulse 73   Ht 5\' 7"  (1.702 m)   Wt 188 lb 6.4 oz (85.5 kg)   BMI 29.51 kg/m    Physical Exam Skin:    General: Skin is warm and dry.     Findings: No rash.  Neurological:     General: No focal deficit present.     Mental Status: She is alert.  Psychiatric:        Mood and Affect: Mood normal.     Comments: Frustrated by symptom chronicity and impact on work and activities     Musculoskeletal Exam:  Wrists full ROM no tenderness or swelling Fingers full ROM, left 3rd and 5th PIPs tender to palpation without synovitis, right normal Knees full ROM b/l without swelling Ankles full ROM, right ankle tenderness to palpation anteriorly and to resisted dorsiflexion, ultrasound inspection suspicious for tenosynovitis changes in extensor digitorum longus tendons no large joint effusions, trace edema without erythema or warmth MTPs  full ROM no tenderness or swelling although right side extension reduced with pain    Investigation: No additional findings.  Imaging: NCV with EMG (electromyography)  Result Date: 04/19/2021 Magnus Sinning, MD     04/20/2021  5:37 AM EMG & NCV Findings: All nerve conduction studies (as indicated in the following tables) were within normal limits.  All examined muscles (as indicated in the following table) showed no evidence of electrical instability.  Impression: Essentially NORMAL electrodiagnostic study of the right lower limb.  There is no significant electrodiagnostic evidence of nerve entrapment, lumbar radiculopathy, lumbosacral plexopathy or generalized peripheral neuropathy.  As you know, purely sensory or demyelinating radiculopathies and chemical radiculitis may not be detected with this particular electrodiagnostic study. **This electrodiagnostic study cannot rule out small fiber polyneuropathy and dysesthesias from central pain syndromes such as stroke  or central pain sensitization syndromes such as fibromyalgia.  Myotomal referral pain from trigger points is also not excluded. Lastly, she does not meet the [place] criteria for complex regional pain syndrome (CRPS). Recommendations: 1.  Follow-up with referring physician. 2.  Continue current management of symptoms.  If felt to be radicular then updated MRI of the lumbar spine.  Consider referral to foot and ankle specialist. ___________________________ [facility] Board Certified, American Board of Physical Medicine and Rehabilitation Nerve Conduction Studies Anti Sensory Summary Table  Stim Site NR Peak (ms) Norm Peak (ms) P-T Amp (V) Norm P-T Amp Site1 Site2 Delta-P (ms) Dist (cm) Vel (m/s) Norm Vel (m/s) Right Saphenous Anti Sensory (Ant Med Mall)  29.3C 14cm    3.0 <4.4 10.2 >2 14cm Ant Med Mall 3.0 0.0  >32 Right Sup Fibular Anti Sensory (Ant Lat Mall)  29.3C 14 cm    3.1 <4.4 6.5 >5.0 14 cm Ant Lat Mall 3.1 14.0 45 >32 Right  Sural Anti Sensory (Lat Mall)  28.8C Calf    2.6 <4.0 9.0 >5.0 Calf Lat Mall 2.6 14.0 54 >35 Motor Summary Table  Stim Site NR Onset (ms) Norm Onset (ms) O-P Amp (mV) Norm O-P Amp Site1 Site2 Delta-0 (ms) Dist (cm) Vel (m/s) Norm Vel (m/s) Right Fibular Motor (Ext Dig Brev)  29.8C Ankle    3.4 <6.1 8.1 >2.5 B Fib Ankle 6.6 32.0 48 >38 B Fib    10.0  3.1  Poplt B Fib 1.4 10.0 71 >40 Poplt    11.4  6.7        Right Tibial Motor (Abd Hall Brev)  30.5C Ankle    6.0 <6.1 12.1 >3.0 Knee Ankle 4.7 38.0 81 >35 Knee    10.7  6.4        EMG  Side Muscle Nerve Root Ins Act Fibs Psw Amp Dur Poly Recrt Int Fraser Din Comment Right AntTibialis Dp Br Peron L4-5 Nml Nml Nml Nml Nml 0 Nml Nml  Right Fibularis Longus  Sup Br Peron L5-S1 Nml Nml Nml Nml Nml 0 Nml Nml  Right MedGastroc Tibial S1-2 Nml Nml Nml Nml Nml 0 Nml Nml  Right VastusMed Femoral L2-4 Nml Nml Nml Nml Nml 0 Nml Nml  Right BicepsFemS Sciatic L5-S1 Nml Nml Nml Nml Nml 0 Nml Nml  Nerve Conduction Studies Anti Sensory Left/Right Comparison  Stim Site L Lat (ms) R Lat (ms) L-R Lat (ms) L Amp (V) R Amp (V) L-R Amp (%) Site1 Site2 L Vel (m/s) R Vel (m/s) L-R Vel (m/s) Saphenous Anti Sensory (Ant Med Mall)  29.3C 14cm  3.0   10.2  14cm Ant Med Mall    Sup Fibular Anti Sensory (Ant Lat Mall)  29.3C 14 cm  3.1   6.5  14 cm Ant Lat Mall  45  Sural Anti Sensory (Lat Mall)  28.8C Calf  2.6   9.0  Calf Lat Mall  54  Motor Left/Right Comparison  Stim Site L Lat (ms) R Lat (ms) L-R Lat (ms) L Amp (mV) R Amp (mV) L-R Amp (%) Site1 Site2 L Vel (m/s) R Vel (m/s) L-R Vel (m/s) Fibular Motor (Ext Dig Brev)  29.8C Ankle  3.4   8.1  B Fib Ankle  48  B Fib  10.0   3.1  Poplt B Fib  71  Poplt  11.4   6.7       Tibial Motor (Abd Hall Brev)  30.5C Ankle  6.0   12.1  Knee Ankle  81  Knee  10.7   6.4       Waveforms:         Recent Labs: Lab Results  Component Value Date   WBC 6.4 04/11/2021   HGB 13.2 04/11/2021   PLT 256 04/11/2021   NA 138 04/11/2021   K 3.6 04/11/2021   CL 104  04/11/2021   CO2 26 04/11/2021   GLUCOSE 80 04/11/2021   BUN 13 04/11/2021   CREATININE 0.87 04/11/2021   BILITOT 0.7 03/10/2020   ALKPHOS 64 03/10/2020   AST 20 03/10/2020   ALT 16 03/10/2020   PROT 7.3 03/10/2020   ALBUMIN 4.2 03/10/2020   CALCIUM 8.8 (L) 04/11/2021   GFRAA >60 03/10/2020    Speciality Comments: No specialty comments available.  Procedures:  No procedures performed Allergies: Cephalexin, Penicillins, Amitiza [lubiprostone], and Sulfa antibiotics   Assessment / Plan:     Visit Diagnoses: Undifferentiated connective tissue disease ([place]) - Plan: Sedimentation rate, C-reactive protein, Anti-DNA antibody, double-stranded, predniSONE (DELTASONE) 10 MG tablet  Diffuse symptoms may still be related to a undifferentiated disease process HCQ treatment would not be effective within 1 month regardless. Alternative treatments such as Benlysta may be an option if symptoms do not improve, I expect she will not tolerate antimetabolites such as methotrexate or mycophenolate due to GI symptoms. Discussed continuing low dose prednisone for inflammatory disease treatment since DMARDs unlikely to have a dramatic impact in the short term.  Fibromyalgia  Severe fatigue with chronic inflammatory joint pain may represent some chronic pain sensitization. May improve with underlying disease treatment although decreased activity level due to symptoms worsening for this.  Epigastric pain  Not tolerant of NSAIDs treatment, trying low dose prednisone with food slightly tolerable.  Pain in right ankle and joints of right foot - Plan: predniSONE (DELTASONE) 10 MG tablet  The acute problem with right leg pain is atypical for systemic inflammatory condition, checking serology for markers of inflammatory activity. She cannot tolerated NSAIDs well due to GI ulcers and epigastric pain. Prednisone only slightly better but recommened this for short term treatment.  Might benefit with foot and ankle  specialist evaluation if not resolving.  Orders: Orders Placed This Encounter  Procedures   Sedimentation rate   C-reactive protein   Anti-DNA antibody, double-stranded    Meds ordered this encounter  Medications   predniSONE (DELTASONE) 10 MG tablet    Sig: Take 1 tablet (10 mg total) by mouth daily with breakfast.    Dispense:  30 tablet    Refill:  0      Follow-Up Instructions: Return in about 4 weeks (around 06/09/2021) for F/U HCQ 8 wks treatment, ankle inflammation 4wks f/u.   Collier Salina, MD  Note - This record has been created using [employer].  Chart creation errors have been sought, but may not always  have been located. Such creation errors do not reflect on  the standard of medical care.

## 2021-05-13 LAB — SEDIMENTATION RATE: Sed Rate: 6 mm/h (ref 0–20)

## 2021-05-13 LAB — C-REACTIVE PROTEIN: CRP: 2.5 mg/L (ref <8.0)

## 2021-05-13 LAB — ANTI-DNA ANTIBODY, DOUBLE-STRANDED: ds DNA Ab: 21 IU/mL — ABNORMAL HIGH

## 2021-05-19 NOTE — Progress Notes (Signed)
Lab markers for inflammation remain the same as initial testing after 1 month.  I still recommend a longer trial of the current treatment as planned before changing or adding additional medications.

## 2021-06-07 ENCOUNTER — Ambulatory Visit: Payer: 59 | Admitting: Internal Medicine

## 2021-06-10 ENCOUNTER — Encounter: Payer: 59 | Admitting: Physical Medicine and Rehabilitation

## 2021-06-13 ENCOUNTER — Encounter: Payer: Self-pay | Admitting: Internal Medicine

## 2021-06-13 ENCOUNTER — Ambulatory Visit: Payer: 59 | Admitting: Internal Medicine

## 2021-06-13 ENCOUNTER — Other Ambulatory Visit: Payer: Self-pay

## 2021-06-13 ENCOUNTER — Other Ambulatory Visit: Payer: Self-pay | Admitting: Internal Medicine

## 2021-06-13 VITALS — BP 107/74 | HR 75 | Ht 67.0 in | Wt 184.6 lb

## 2021-06-13 DIAGNOSIS — M25571 Pain in right ankle and joints of right foot: Secondary | ICD-10-CM

## 2021-06-13 DIAGNOSIS — M359 Systemic involvement of connective tissue, unspecified: Secondary | ICD-10-CM

## 2021-06-13 MED ORDER — HYDROXYCHLOROQUINE SULFATE 200 MG PO TABS: 400.0000 mg | ORAL_TABLET | Freq: Every day | ORAL | 1 refills | Status: DC

## 2021-06-13 MED ORDER — PREDNISONE 10 MG PO TABS: 10.0000 mg | ORAL_TABLET | Freq: Every day | ORAL | 1 refills | Status: DC

## 2021-06-13 NOTE — Progress Notes (Signed)
Office Visit Note  Patient: Christy Lowe             Date of Birth: 04-16-1981           MRN: [id number]             PCP: Collier Salina, MD Referring: Collier Salina, MD Visit Date: 06/13/2021   Subjective:  Follow-up (Patient is taking PLQ 400 mg daily and Prednisone 10 mg daily. Patient feels as if symptoms are more tolerable with current treatment regimen.)   History of Present Illness: Christy Lowe is a 40 y.o. 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  Since last visit she has a significant but incomplete benefit in symptoms with starting 10 mg daily prednisone while continuing the hydroxychloroquine.  She has noticed decreased sleep quality with multiple nighttime awakening also having some mood irritability.  She feels generalized stiffness lasting about half an hour daily.  Previous HPI: 05/12/21 Christy Lowe is a 40 y.o. female here for follow up for undifferentiated connective tissue disease with ongoing severe fatigue, joint pains, and most recently the right calf and ankle pain that has been continuing since about a month ago. This started abruptly without any provoking injury that she noticed. Initial ED visit was unrevealing, we discussed symptoms by phone and trial or prednisone she felt helped her joints feel improved, previously having a sensation as if full of sand and pain with movement or weight bearing. Pain in the calf is posterior but ankle pain is anterior and all on right side. Swelling is still present but less severe and no discoloration compared to initial symptoms, she describes intermittent discoloration of the great toe. She also still feels diffuse symptoms with the pain and stiffness in her hands still just as bad. She has not noticed any improvement since  starting the hydroxychloroquine medication for a month. She saw Dr. Ernestina Patches for nerve conduction study this was unremarkable for evidence of neuropathy.  04/01/21 Christy Lowe is a 40 y.o. female here for follow up for positive ANA with joint pain, dry eyes and mouth, skin rashes, episodic lymphadenopathy, and tremors. She has also had some neurologic workup concerning for MS without any long term immunosuppressive treatment. At initial visit about 6 months ago no specific CTD signs and symptoms seen. Specific ENA serologies were all negative.  Since last visit she has noticed some worsening in the episodic foot and toe discoloration and numbness worst in the right great toe.  She is also experienced increased swelling in her left third finger and fifth finger. She also feels extremely fatigued with poor appetite but has experienced significant weight gains since August of last year.  11/17/20 Christy Lowe is a 40 y.o. female here for evaluation of positive ANA. She is noticing multiple symptoms including generalized alopecia, oral dryness, left sided neck swelling, joint pain and stiffness, position tremor, and episodic skin rashes.   She has had multiple ongoing problems since many years ago. She was hospitalized for viral meningitis at age 51 and feels she has been susceptible to infections ever since this time or before, with numerous cases of bronchitis and has had some lung nodules on CT imaging during these repeat episodes.   About 7 years ago she suffered from a RLE DVT provoked after right knee arthroscopy and  developed right sided face and arm numbness. Workup indicated she had a TIA also noted to have PFO. She takes ASA since that time and has taken anticoagulation in perioperative periods.   Workup with MRI and LP studies were apparently also indicative for MS. She was treated with multiple rounds of steroids for months at a time and felt a lot of mood disturbance from these. She was  never started on other immunomodulatory or imunosupressive treatments.   09/2020 ANA positive dsDNA 24  Review of Systems  Constitutional:  Positive for fatigue.  HENT:  Positive for mouth sores. Negative for mouth dryness and nose dryness.   Eyes:  Positive for itching and visual disturbance. Negative for pain and dryness.  Respiratory:  Negative for cough, hemoptysis, shortness of breath and difficulty breathing.   Cardiovascular:  Negative for chest pain, palpitations and swelling in legs/feet.  Gastrointestinal:  Negative for abdominal pain, blood in stool, constipation and diarrhea.  Endocrine: Negative for increased urination.  Genitourinary:  Negative for painful urination.  Musculoskeletal:  Positive for morning stiffness. Negative for joint pain, joint pain, joint swelling, myalgias, muscle weakness, muscle tenderness and myalgias.  Skin:  Positive for rash. Negative for color change and redness.  Allergic/Immunologic: Negative for susceptible to infections.  Neurological:  Positive for numbness. Negative for dizziness, headaches, memory loss and weakness.  Hematological:  Negative for swollen glands.  Psychiatric/Behavioral:  Positive for sleep disturbance. Negative for confusion.    PMFS History:  Patient Active Problem List   Diagnosis Date Noted   Pain in right ankle and joints of right foot 05/12/2021   Finger joint swelling, left 04/01/2021   Undifferentiated connective tissue disease ([place]) 04/01/2021   Fibromyalgia 04/01/2021   Positive ANA (antinuclear antibody) 11/17/2020   Alopecia 11/17/2020   Other fatigue 10/12/2020   Epigastric pain    Multiple sclerosis ([place]) 08/26/2019   Migraine without aura    History of DVT (deep vein thrombosis)    History of endometriosis    Right-sided muscle weakness 05/15/2019   Abnormal finding on MRI of brain 05/15/2019   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   Mass of brain 12/24/2017   Tremor 12/24/2017   Numbness 12/24/2017    Urinary incontinence 12/24/2017    Past Medical History:  Diagnosis Date   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    Dysmenorrhea    GERD (gastroesophageal reflux disease)    Heart murmur  PFO    Hemangioma    History of DVT (deep vein thrombosis)    History of endometriosis    Hypotension    Migraine without aura    MS (multiple sclerosis) ([place])    Multiple sclerosis ([place]) 08/26/2019   Neuromuscular disorder ([place])    Lupus   Stroke ([place])    ages 53 and 80    Family History  Problem Relation Age of Onset   Prostate cancer Paternal Grandfather    Heart attack Paternal Grandfather    Esophageal cancer Paternal Grandfather    Breast cancer Other    Alcohol abuse Mother    Heart attack Father    COPD Father    Diabetes Maternal Grandmother    Cervical cancer Maternal Grandmother    Fibromyalgia Maternal Grandmother    Prostate cancer Maternal Grandfather    Heart attack Maternal Grandfather    ALS Paternal Grandmother    Colon cancer Neg Hx    Stomach cancer Neg Hx    Rectal cancer Neg Hx    Past Surgical  History:  Procedure Laterality Date   ANTERIOR CRUCIATE LIGAMENT REPAIR     PELVIC LAPAROSCOPY     endometriosis   TONSILLECTOMY     Social History   Social History Narrative   Not on file   Immunization History  Administered Date(s) Administered   PFIZER(Purple Top)SARS-COV-2 Vaccination 06/27/2020, 07/28/2020   PPD Test 01/23/2019     Objective: Vital Signs: BP 107/74 (BP Location: Left Arm, Patient Position: Sitting, Cuff Size: Normal)   Pulse 75   Ht 5\' 7"  (1.702 m)   Wt 184 lb 9.6 oz (83.7 kg)   BMI 28.91 kg/m    Physical Exam Eyes:     Conjunctiva/sclera: Conjunctivae normal.  Skin:    General: Skin is warm and dry.     Findings: No rash.  Neurological:     General: No focal deficit present.     Mental Status: She is alert.  Psychiatric:        Mood and Affect: Mood normal.     Musculoskeletal Exam:  Shoulders full ROM no tenderness or  swelling Elbows full ROM no tenderness or swelling Wrists full ROM no tenderness or swelling Fingers full ROM, stiffness and mild tenderness worst at left 3rd PIP, DIP early heberdon's nodes present R>L Knees full ROM no tenderness or swelling Ankles full ROM no tenderness or swelling MTPs full ROM no tenderness or swelling  Investigation: No additional findings.  Imaging: No results found.  Recent Labs: Lab Results  Component Value Date   WBC 6.4 04/11/2021   HGB 13.2 04/11/2021   PLT 256 04/11/2021   NA 138 04/11/2021   K 3.6 04/11/2021   CL 104 04/11/2021   CO2 26 04/11/2021   GLUCOSE 80 04/11/2021   BUN 13 04/11/2021   CREATININE 0.87 04/11/2021   BILITOT 0.7 03/10/2020   ALKPHOS 64 03/10/2020   AST 20 03/10/2020   ALT 16 03/10/2020   PROT 7.3 03/10/2020   ALBUMIN 4.2 03/10/2020   CALCIUM 8.8 (L) 04/11/2021   GFRAA >60 03/10/2020    Speciality Comments: No specialty comments available.  Procedures:  No procedures performed Allergies: Cephalexin, Penicillins, Amitiza [lubiprostone], and Sulfa antibiotics   Assessment / Plan:     Visit Diagnoses: Undifferentiated connective tissue disease ([place])  UCTD vs incomplete lupus with joint pain and inflammation and elevated double-stranded DNA antibody symptoms fairly controlled on 400 mg hydroxychloroquine and 10 mg daily prednisone.  I discussed recommendation we need to get to a lower daily prednisone due to side effect risks.  I recommend she continue the 10 mg dose for another 4 weeks at that time she will be 12 weeks into hydroxychloroquine and to try to taper the prednisone dose down to 5 mg daily.  If symptoms remain well controlled we can maintain this as treatment if symptoms are not controlled on a lower dose of prednisone will recommend adding additional treatment.  She has multiple concerns with antimetabolite such as methotrexate due to her existing alopecia and severe nausea and gastrointestinal issues.  Pain  in right ankle and joints of right foot  Current symptoms are improved there is no focal swelling or tenderness on physical exam today.  Orders: No orders of the defined types were placed in this encounter.  Meds ordered this encounter  Medications   predniSONE (DELTASONE) 10 MG tablet    Sig: Take 1 tablet (10 mg total) by mouth daily with breakfast.    Dispense:  30 tablet    Refill:  1  hydroxychloroquine (PLAQUENIL) 200 MG tablet    Sig: Take 2 tablets (400 mg total) by mouth daily.    Dispense:  60 tablet    Refill:  1      Follow-Up Instructions: Return in about 6 weeks (around 07/25/2021) for CTD on HCQ+GC  f/u 6wks.   Collier Salina, MD  Note - This record has been created using [employer].  Chart creation errors have been sought, but may not always  have been located. Such creation errors do not reflect on  the standard of medical care.

## 2021-06-14 ENCOUNTER — Encounter: Payer: Self-pay | Admitting: Radiology

## 2021-06-14 ENCOUNTER — Other Ambulatory Visit: Payer: Self-pay | Admitting: Radiology

## 2021-06-14 DIAGNOSIS — M359 Systemic involvement of connective tissue, unspecified: Secondary | ICD-10-CM

## 2021-06-14 DIAGNOSIS — Z79899 Other long term (current) drug therapy: Secondary | ICD-10-CM

## 2021-07-22 ENCOUNTER — Ambulatory Visit: Payer: 59 | Admitting: Internal Medicine

## 2021-07-24 NOTE — Progress Notes (Signed)
Office Visit Note  Patient: Christy Lowe             Date of Birth: July 27, 1981           MRN: [id number]             PCP: Collier Salina, MD Referring: Collier Salina, MD Visit Date: 07/25/2021   Subjective:  Follow-up (Patient complains of increased symptoms since tapering Prednisone from 10 mg daily to 5 mg daily. Patient is currently taking PLQ 400 mg daily.)   History of Present Illness: Christy Lowe is a 40 y.o. female here for follow up for undifferentiated CTD with ongoing joint pain and inflammation primarily in the foot and ankle on hydroxychloroquine 400 mg daily and prednisone 5 mg daily. Since decreasing the steroid dose she has noticed several increased symptoms with diffuse redness and heat of her skin, an indurated area on he scalp, increased swelling around the left side base of the neck, and increased pain and numbness intermittently affecting the right foot and great toe. She also feels the associated fatigue is worse with decreased steroid dose.    Previous HPI: 06/13/21 Christy Lowe is a 40 y.o. 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  Since last visit she has a significant but incomplete benefit in symptoms with starting 10 mg daily prednisone while continuing the hydroxychloroquine.  She has noticed decreased sleep quality with multiple nighttime awakening also having some mood irritability.  She feels generalized stiffness lasting about half an hour daily.   11/17/20 Christy Lowe is a 40 y.o. female here for evaluation of positive ANA. She is noticing multiple symptoms including generalized alopecia, oral dryness, left sided neck swelling, joint pain and stiffness, position tremor, and episodic skin rashes. She has had multiple ongoing problems since many years  ago. She was hospitalized for viral meningitis at age 70 and feels she has been susceptible to infections ever since this time or before, with numerous cases of bronchitis and has had some lung nodules on CT imaging during these repeat episodes. About 7 years ago she suffered from a RLE DVT provoked after right knee arthroscopy and developed right sided face and arm numbness. Workup indicated she had a TIA also noted to have PFO. She takes ASA since that time and has taken anticoagulation in perioperative periods. Workup with MRI and LP studies were apparently also indicative for MS. She was treated with multiple rounds of steroids for months at a time and felt a lot of mood disturbance from these. She was never started on other immunomodulatory or imunosupressive treatments.   09/2020 ANA positive dsDNA 24   Review of Systems  Constitutional:  Positive for fatigue.  HENT:  Positive for mouth sores. Negative for mouth dryness and nose dryness.   Eyes:  Positive for itching. Negative for pain, visual disturbance and dryness.  Respiratory:  Negative for cough, hemoptysis, shortness of breath and difficulty breathing.   Cardiovascular:  Negative for chest pain, palpitations and swelling in legs/feet.  Gastrointestinal:  Negative for abdominal pain, blood in stool, constipation and diarrhea.  Endocrine: Negative for increased urination.  Genitourinary:  Negative for painful urination.  Musculoskeletal:  Positive for joint pain, joint pain, joint swelling, muscle weakness and morning stiffness. Negative for myalgias, muscle tenderness and myalgias.  Skin:  Positive for  color change, rash and redness.  Allergic/Immunologic: Negative for susceptible to infections.  Neurological:  Positive for numbness and parasthesias. Negative for dizziness, headaches, memory loss and weakness.  Hematological:  Negative for swollen glands.  Psychiatric/Behavioral:  Negative for confusion and sleep disturbance.     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   Mass of brain 12/24/2017   Tremor 12/24/2017   Numbness 12/24/2017   Urinary incontinence 12/24/2017    Past Medical History:  Diagnosis Date   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    Dysmenorrhea    GERD (gastroesophageal reflux disease)    Heart murmur  PFO    Hemangioma    History of DVT (deep vein thrombosis)    History of endometriosis    Hypotension    Migraine without aura    MS (multiple sclerosis) ([place])    Multiple sclerosis ([place]) 08/26/2019   Neuromuscular disorder ([place])    Lupus   Stroke ([place])    ages 59 and 69    Family History  Problem Relation Age of Onset   Prostate cancer Paternal Grandfather    Heart attack Paternal Grandfather    Esophageal cancer Paternal Grandfather    Breast cancer Other    Alcohol abuse Mother    Heart attack Father    COPD Father    Diabetes Maternal Grandmother    Cervical cancer Maternal Grandmother    Fibromyalgia Maternal Grandmother    Prostate cancer Maternal Grandfather    Heart attack Maternal Grandfather    ALS Paternal Grandmother    Colon cancer Neg Hx    Stomach cancer Neg Hx    Rectal cancer Neg Hx    Past Surgical History:  Procedure Laterality Date   ANTERIOR CRUCIATE LIGAMENT REPAIR     PELVIC LAPAROSCOPY     endometriosis   TONSILLECTOMY     Social History   Social History Narrative   Not on file   Immunization  History  Administered Date(s) Administered   PFIZER(Purple Top)SARS-COV-2 Vaccination 06/27/2020, 07/28/2020   PPD Test 01/23/2019     Objective: Vital Signs: BP 116/81 (BP Location: Left Arm, Patient Position: Sitting, Cuff Size: Normal)   Pulse 90   Ht '5\' 7"'  (1.702 m)   Wt 184 lb 12.8 oz (83.8 kg)   BMI 28.94 kg/m    Physical Exam Neck:     Comments: Soft tissue swelling left base of neck near clavicle, no palpable adenopathy Cardiovascular:     Rate and Rhythm: Normal rate and regular rhythm.  Pulmonary:     Effort: Pulmonary effort is normal.     Breath sounds: Normal breath sounds.  Skin:    General: Skin is warm and dry.     Findings: Erythema present.     Comments: Diffuse, blanchable erythema involving entirety of head, neck, chest, and arms sparing lower extremities completely Small palpable soft area on scalp  slightly right of midline  Neurological:     General: No focal deficit present.     Mental Status: She is alert.  Psychiatric:        Mood and Affect: Mood normal.     Musculoskeletal Exam:  Elbows full ROM no tenderness or swelling Wrists full ROM no tenderness or swelling Fingers full ROM no tenderness or swelling Knees full ROM no tenderness or swelling   Investigation: No additional findings.  Imaging: No results found.  Recent Labs: Lab Results  Component Value Date   WBC 7.6 07/25/2021   HGB 13.8 07/25/2021   PLT 263 07/25/2021   NA 138 07/25/2021   K 4.3 07/25/2021   CL 105 07/25/2021   CO2 25 07/25/2021   GLUCOSE 77 07/25/2021   BUN 17 07/25/2021   CREATININE 0.92 07/25/2021   BILITOT 0.5 07/25/2021   ALKPHOS 64 03/10/2020   AST 18 07/25/2021   ALT 12 07/25/2021   PROT 7.1 07/25/2021   ALBUMIN 4.2 03/10/2020   CALCIUM 10.0 07/25/2021   GFRAA >60 03/10/2020    Speciality Comments: No specialty comments available.  Procedures:  No procedures performed Allergies: Cephalexin, Penicillins, Amitiza [lubiprostone], and Sulfa  antibiotics   Assessment / Plan:     Visit Diagnoses: Systemic lupus erythematosus, unspecified SLE type, unspecified organ involvement status ([place]) - Plan: CBC with Differential/Platelet, COMPLETE METABOLIC PANEL WITH GFR, Sedimentation rate  Serology consistent with lupus although symptoms are very diffuse and nonspecific of lupus. Symptoms are definitely increased with the prednisone reduction. Repeating CBC, CMP, and ESR. I recommend continuing the hydroxychloroquine 400 mg daily and prednisone 5 mg daily for now but plan to start Benlysta particularly for skin and joint disease.  Fibromyalgia  Diffuse skin erythema and sensitivity is unusual possibly more neuropathic or vasomotor and inflammatory disease. She remains pretty active but needs further treatment for an underlying problem.  Alopecia  She feels methotrexate would not be tolerable and concern about worsening existing problems with GI symptoms and alopecia. I do think it is unlikely to be tolerted at an adequate dose for treatment.  High risk medication use - Plan: CBC with Differential/Platelet, QuantiFERON-TB Gold Plus, IgG, IgA, IgM, Hepatitis B core antibody, IgM, Hepatitis B surface antigen, Hepatitis C antibody, COMPLETE METABOLIC PANEL WITH GFR  Anticipate start of biologic DMARD treatment checking baseline monitoring albs CBC, TB, immunoglobulins, hepatitis panel, and CMP. Discussed risks of medication including infections, lymphoma, disease reactivation. Also discussed treatment would recommend against new vaccination series while on B-cell targeting medication. Orders: Orders Placed This Encounter  Procedures   CBC with Differential/Platelet   QuantiFERON-TB Gold Plus   IgG, IgA, IgM   Hepatitis B core antibody, IgM   Hepatitis B surface antigen   Hepatitis C antibody   COMPLETE METABOLIC PANEL WITH GFR   Sedimentation rate    No orders of the defined types were placed in this encounter.    Follow-Up  Instructions: No follow-ups on file.   Collier Salina, MD  Note - This record has been created using [employer].  Chart creation errors have been sought, but may not always  have been located. Such creation errors do not reflect on  the standard of medical care.

## 2021-07-25 ENCOUNTER — Ambulatory Visit (INDEPENDENT_AMBULATORY_CARE_PROVIDER_SITE_OTHER): Payer: 59 | Admitting: Internal Medicine

## 2021-07-25 ENCOUNTER — Encounter: Payer: Self-pay | Admitting: Internal Medicine

## 2021-07-25 ENCOUNTER — Telehealth: Payer: Self-pay | Admitting: *Deleted

## 2021-07-25 ENCOUNTER — Other Ambulatory Visit: Payer: Self-pay

## 2021-07-25 VITALS — BP 116/81 | HR 90 | Ht 67.0 in | Wt 184.8 lb

## 2021-07-25 DIAGNOSIS — M797 Fibromyalgia: Secondary | ICD-10-CM | POA: Diagnosis not present

## 2021-07-25 DIAGNOSIS — Z79899 Other long term (current) drug therapy: Secondary | ICD-10-CM

## 2021-07-25 DIAGNOSIS — L659 Nonscarring hair loss, unspecified: Secondary | ICD-10-CM

## 2021-07-25 DIAGNOSIS — M329 Systemic lupus erythematosus, unspecified: Secondary | ICD-10-CM

## 2021-07-25 NOTE — Telephone Encounter (Signed)
Please apply for Benlysta. Thank you.

## 2021-07-25 NOTE — Patient Instructions (Signed)
Belimumab solution for injection What is this medication? BELIMUMAB (be LIM ue mab) is a monoclonal antibody. It is used to treat active systemic lupus erythematosus (SLE) or active lupus nephritis (lupus-related kidney inflammation). This drug is used with standard therapy for theseconditions. It is not a cure. This medicine may be used for other purposes; ask your health care provider orpharmacist if you have questions. COMMON BRAND NAME(S): Rozanna Boer [place] What should I tell my care team before I take this medication? They need to know if you have any of these conditions: cancer depression immune system problems infection (especially a virus infection such as chickenpox, cold sores, or herpes) mental illness recently received or scheduled to receive a vaccination suicidal thoughts, plans, or attempt or a previous suicide attempt by you or a family member an unusual or allergic reaction to belimumab, other medicines, foods, dyes, or preservatives pregnant or trying to get pregnant breast-feeding How should I use this medication? This drug is injected into a vein or injected under the skin. Injections into a vein are given by a health care provider in a hospital or clinic setting. Injections under the skin may be given at home. If you get this drug at home, you will be taught how to prepare and give it under the skin. Use exactly as directed. Take it as directed on the prescription label at the same time everyday. Keep taking it unless your health care provider tells you to stop. This drug comes with INSTRUCTIONS FOR USE. Ask your pharmacist for directions on how to use this drug. Read the information carefully. Talk to yourpharmacist or health care provider if you have questions. It is important that you put your used needles and syringes in a special sharps container. Do not put them in a trash can. If you do not have a sharpscontainer, call your pharmacist or health care provider to get  one. A special MedGuide will be given to you by the pharmacist with each prescription and refill. If you are getting this drug in a hospital or clinic, a special MedGuide will be given to you before each treatment. Be sure to readthis information carefully each time. Talk to your health care provider about the use of this drug in children. While it be prescribed for children as young as 5 years for selected conditions,precautions do apply. Overdosage: If you think you have taken too much of this medicine contact apoison control center or emergency room at once. NOTE: This medicine is only for you. Do not share this medicine with others. What if I miss a dose? This drug is used once a week if injected under the skin. If you miss your dose, inject a dose as soon as you remember. Then, inject your next dose at your regularly scheduled time or continue weekly dosing based on the new day injected. Call your health care provider if you are not sure what to do. Do notuse 2 doses on the same day to make up for a missed dose. If you are to be given an injection into the vein, keep appointments for follow-up doses. It is important not to miss your dose. Call your health careprovider if you are unable to keep an appointment. What may interact with this medication? Do not take this medicine with any of the following medicines: live virus vaccines This medicine may also interact with the following medicines: biologic medicines such as adalimumab, certolizumab, etanercept, golimumab, infliximab cyclophosphamide monoclonal antibodies such as ocrelizumab, ofatumumab, rituximab This list  may not describe all possible interactions. Give your health care provider a list of all the medicines, herbs, non-prescription drugs, or dietary supplements you use. Also tell them if you smoke, drink alcohol, or use illegaldrugs. Some items may interact with your medicine. What should I watch for while using this medication? Visit  your health care provider for regular checks on your progress. Tell your health care provider if your symptoms do not start to get better or if they getworse. This drug may increase your risk of getting an infection. Call your health care provider for advice if you get a fever, chills, or sore throat, or other symptoms of a cold or flu. Do not treat yourself. Try to avoid being aroundpeople who are sick. In some patients, this drug may cause a serious brain infection that may cause death. If you have any problems seeing, thinking, speaking, walking, or standing, tell your doctor right away. If you cannot reach your health careprovider, urgently seek other source of medical care. Talk to your health care provider about your risk of cancer. You may be more atrisk for certain types of cancer if you take this drug. What side effects may I notice from receiving this medication? Side effects that you should report to your doctor or health care professionalas soon as possible: allergic reactions (skin rash, itching or hives; swelling of the face, lips, or tongue) anxious breathing problems changes in emotions or moods confusion depressed mood dizziness feeling faint or lightheaded loss of memory problems with balance, talking, or walking signs and symptoms of infection like fever; chills; cough; sore throat; pain or trouble passing urine suicidal thoughts trouble sleeping unusually slow heartbeat Side effects that usually do not require medical attention (report to yourdoctor or health care professional if they continue or are bothersome): diarrhea headache muscle pain nausea pain, redness or irritation at site where injected stuffy or runny nose This list may not describe all possible side effects. Call your doctor for medical advice about side effects. You may report side effects to FDA at1-800-FDA-1088. Where should I keep my medication? If injected into the vein, this drug will be given in  a hospital or clinic. Itwill not be stored at home. Storage for syringes and auto injectors stored at home: Keep out of the reach of children. Store in a refrigerator between 2 and 8 degrees C (36 and 46 degrees F). Keep this medicine in the original container. Protect from light. Do not freeze. Do not shake. Do not use this medicine and do not place it back in the refrigerator if left out at room temperature for more than 12 hours. Throw awayany unused medicine after the expiration date. NOTE: This sheet is a summary. It may not cover all possible information. If you have questions about this medicine, talk to your doctor, pharmacist, orhealth care provider.  2022 Elsevier/Gold Standard (2019-11-16 16:07:36)

## 2021-07-25 NOTE — Telephone Encounter (Addendum)
Awaiting chart notes and lab results to be finalized and signed before submitting a FAXED Prior Authorization request to [facility] for [place] via CoverMyMeds. Will update once we receive a response.   Key: [id number]

## 2021-07-26 ENCOUNTER — Telehealth: Payer: Self-pay

## 2021-07-28 ENCOUNTER — Telehealth: Payer: Self-pay

## 2021-07-28 NOTE — Telephone Encounter (Signed)
Patient called checking the status of her Benlysta medication.  Patient states she is leaving to go out of town on Monday, 08/01/21 and will return on 08/12/21.

## 2021-07-28 NOTE — Telephone Encounter (Signed)
Results completed and I reviewed now looks okay for proceeding with Benlysta. Thanks.

## 2021-07-29 LAB — CBC WITH DIFFERENTIAL/PLATELET
Absolute Monocytes: 578 cells/uL (ref 200–950)
Basophils Absolute: 30 cells/uL (ref 0–200)
Basophils Relative: 0.4 %
Eosinophils Absolute: 84 cells/uL (ref 15–500)
Eosinophils Relative: 1.1 %
HCT: 41 % (ref 35.0–45.0)
Hemoglobin: 13.8 g/dL (ref 11.7–15.5)
Lymphs Abs: 1710 cells/uL (ref 850–3900)
MCH: 30.8 pg (ref 27.0–33.0)
MCHC: 33.7 g/dL (ref 32.0–36.0)
MCV: 91.5 fL (ref 80.0–100.0)
MPV: 9.5 fL (ref 7.5–12.5)
Monocytes Relative: 7.6 %
Neutro Abs: 5198 cells/uL (ref 1500–7800)
Neutrophils Relative %: 68.4 %
Platelets: 263 10*3/uL (ref 140–400)
RBC: 4.48 10*6/uL (ref 3.80–5.10)
RDW: 12.6 % (ref 11.0–15.0)
Total Lymphocyte: 22.5 %
WBC: 7.6 10*3/uL (ref 3.8–10.8)

## 2021-07-29 LAB — HEPATITIS C ANTIBODY
Hepatitis C Ab: NONREACTIVE
SIGNAL TO CUT-OFF: 0.01 (ref <1.00)

## 2021-07-29 LAB — COMPLETE METABOLIC PANEL WITH GFR
AG Ratio: 1.8 (calc) (ref 1.0–2.5)
ALT: 12 U/L (ref 6–29)
AST: 18 U/L (ref 10–30)
Albumin: 4.6 g/dL (ref 3.6–5.1)
Alkaline phosphatase (APISO): 45 U/L (ref 31–125)
BUN: 17 mg/dL (ref 7–25)
CO2: 25 mmol/L (ref 20–32)
Calcium: 10 mg/dL (ref 8.6–10.2)
Chloride: 105 mmol/L (ref 98–110)
Creat: 0.92 mg/dL (ref 0.50–0.97)
Globulin: 2.5 g/dL (calc) (ref 1.9–3.7)
Glucose, Bld: 77 mg/dL (ref 65–99)
Potassium: 4.3 mmol/L (ref 3.5–5.3)
Sodium: 138 mmol/L (ref 135–146)
Total Bilirubin: 0.5 mg/dL (ref 0.2–1.2)
Total Protein: 7.1 g/dL (ref 6.1–8.1)
eGFR: 82 mL/min/{1.73_m2} (ref >=60)

## 2021-07-29 LAB — QUANTIFERON-TB GOLD PLUS
Mitogen-NIL: >10 IU/mL
NIL: 0.22 IU/mL
QuantiFERON-TB Gold Plus: NEGATIVE
TB1-NIL: <0 IU/mL
TB2-NIL: <0 IU/mL

## 2021-07-29 LAB — SEDIMENTATION RATE: Sed Rate: 6 mm/h (ref 0–20)

## 2021-07-29 LAB — HEPATITIS B SURFACE ANTIGEN: Hepatitis B Surface Ag: NONREACTIVE

## 2021-07-29 LAB — IGG, IGA, IGM
IgG (Immunoglobin G), Serum: 1187 mg/dL (ref 600–1640)
IgM, Serum: 178 mg/dL (ref 50–300)
Immunoglobulin A: 92 mg/dL (ref 47–310)

## 2021-07-29 LAB — HEPATITIS B CORE ANTIBODY, IGM: Hep B C IgM: NONREACTIVE

## 2021-07-29 NOTE — Telephone Encounter (Signed)
Submitted a FAXED Prior Authorization request to [facility] for BENLYSTA via CoverMyMeds. Will update once we receive a response.   Key: [id number]

## 2021-08-02 ENCOUNTER — Telehealth: Payer: Self-pay | Admitting: Internal Medicine

## 2021-08-02 NOTE — Telephone Encounter (Signed)
FYI: Patient wanted to let you know she has started experiencing back pain since last Tuesday. Patient has had no known injury, and is drinking plenty of water.

## 2021-08-03 NOTE — Telephone Encounter (Signed)
Patient states she is experiencing constant nagging low back pain. Patient does report sharp pains at times with movement. Patient reports it does feel like menstrual cramps in her back. Patient states she does have an IUD. Patient denies any injury or unusual activities. Patient does report she is taking plaquenil and prednisone on schedule and has not missed any doses. We are currently awaiting approval for benlysta.   Dx: Systemic lupus erythematosus, unspecified SLE type, unspecified organ involvement status  Please advise.

## 2021-08-04 ENCOUNTER — Encounter: Payer: Self-pay | Admitting: *Deleted

## 2021-08-04 NOTE — Telephone Encounter (Signed)
Symptoms sound most suggestive for muscle spasm or cramping. Has she taken any muscle relaxant type medication such as flexeril or baclofen in the past? If she hs tried them with some benefit, or if has not taken anything before, we could try a medication like that as needed for symptoms.

## 2021-08-04 NOTE — Telephone Encounter (Signed)
Patient's voicemail is full, sent a MyChart message.

## 2021-08-04 NOTE — Telephone Encounter (Addendum)
Received fax for additional clinical documentation for Benlysta to [facility].  Formulary alternatives are cyclophosphamide and azathioprine. Patient does not have any absolute contraindications with current disease state or medication list. Did detail that patient has history of DVT and do not prefer cyclophosphamide.  [phone] Phone: [phone]  Knox Saliva, PharmD, MPH, BCPS Clinical Pharmacist (Rheumatology and Pulmonology)

## 2021-08-06 MED ORDER — CYCLOBENZAPRINE HCL 10 MG PO TABS: 10.0000 mg | ORAL_TABLET | Freq: Every evening | ORAL | 0 refills | Status: DC | PRN

## 2021-08-08 ENCOUNTER — Other Ambulatory Visit (HOSPITAL_COMMUNITY): Payer: Self-pay

## 2021-08-08 NOTE — Telephone Encounter (Addendum)
Called pharmacy help desk for update on patient's [facility] authorization.  Received notification from [facility] regarding a prior authorization for [place]. Authorization has been APPROVED from 08/06/21 to 08/05/22.   Unable to run test claim since patient must fill through [employer]: [phone]  Awaiting prior authorization approval letter  Authorization # [id number] Phone # [phone]  Enrolled patient into Benlysta savings card: ID: [id number] Confirmation number: [id number] Group: [id number] Rx Bin: [id number] PCN: [id number]  Patient scheduled for Benlysta new start on 08/11/21. She states she may have tooth abscess/infection and will be reaching out to dentist for antibiotic. She request alternative antibiotic since she takes hydroxychloroquine. I recommended she touch base with her dentist and let them know she is taking Plaquenil and they will adjust therapy accordingly. I also reviewed that we would have to r/s Benlysta new start if she starts antibiotic. Advised that improvement from [facility] can take several months. She will call back to r/s Benlysta new start if she starts abx this week  Knox Saliva, PharmD, MPH, BCPS Clinical Pharmacist (Rheumatology and Pulmonology)

## 2021-08-10 NOTE — Progress Notes (Signed)
Pharmacy Note  Subjective:   Patient presents to clinic today to receive first dose of Benlysta for SLE. She continues hydroxychloroquine 400 mg once daily and prednisone '10mg'$  daily. She is naive to injectable medications and biologics. She is teary when speaking and expresses frustration with her SLE progression. She is also concerned about changing many aspects of her lifestyle to accommodate starting an immunosuppressive therapy like Benlysta particularly because she travels for work quite frequently.  Patient running a fever or have signs/symptoms of infection? No  Patient currently on antibiotics for the treatment of infection? No  Patient have any upcoming invasive procedures/surgeries? No  Objective: CMP     Component Value Date/Time   NA 138 07/25/2021 1443   K 4.3 07/25/2021 1443   CL 105 07/25/2021 1443   CO2 25 07/25/2021 1443   GLUCOSE 77 07/25/2021 1443   BUN 17 07/25/2021 1443   CREATININE 0.92 07/25/2021 1443   CALCIUM 10.0 07/25/2021 1443   PROT 7.1 07/25/2021 1443   ALBUMIN 4.2 03/10/2020 1117   AST 18 07/25/2021 1443   AST 19 06/04/2019 1120   ALT 12 07/25/2021 1443   ALT 12 06/04/2019 1120   ALKPHOS 64 03/10/2020 1117   BILITOT 0.5 07/25/2021 1443   BILITOT 0.4 06/04/2019 1120   GFRNONAA >60 04/11/2021 1945   GFRNONAA >60 06/04/2019 1120   GFRNONAA 88 04/04/2019 0959   GFRAA >60 03/10/2020 1117   GFRAA >60 06/04/2019 1120   GFRAA 101 04/04/2019 0959    CBC    Component Value Date/Time   WBC 7.6 07/25/2021 1443   RBC 4.48 07/25/2021 1443   HGB 13.8 07/25/2021 1443   HCT 41.0 07/25/2021 1443   PLT 263 07/25/2021 1443   MCV 91.5 07/25/2021 1443   MCH 30.8 07/25/2021 1443   MCHC 33.7 07/25/2021 1443   RDW 12.6 07/25/2021 1443   LYMPHSABS 1,710 07/25/2021 1443   MONOABS 0.6 04/11/2021 1945   EOSABS 84 07/25/2021 1443   BASOSABS 30 07/25/2021 1443    Baseline Immunosuppressant Therapy Labs TB GOLD Quantiferon TB Gold Latest Ref Rng & Units  07/25/2021  Quantiferon TB Gold Plus NEGATIVE NEGATIVE   Hepatitis Panel Hepatitis Latest Ref Rng & Units 07/25/2021  Hep B Surface Ag NON-REACTIVE NON-REACTIVE  Hep B IgM NON-REACTIVE NON-REACTIVE  Hep C Ab NON-REACTIVE NON-REACTIVE  Hep C Ab NON-REACTIVE NON-REACTIVE   HIV No results found for: HIV  Immunoglobulins Immunoglobulin Electrophoresis Latest Ref Rng & Units 07/25/2021  IgA  47 - 310 mg/dL 92  IgG 600 - 1,640 mg/dL 1,187  IgM 50 - 300 mg/dL 178   SPEP Serum Protein Electrophoresis Latest Ref Rng & Units 07/25/2021  Total Protein 6.1 - 8.1 g/dL 7.1   G6PD No results found for: G6PDH TPMT No results found for: TPMT   Chest x-ray: 04/11/21 - no active cardiopulmonary disease  Assessment/Plan:  Counseled patient that Benlysta is a B-cell inhibitor.  Counseled patient on purpose, proper use, and adverse effects of Benlysta.  Reviewed the most common adverse effects including infections, headache, nausea/diarrhea and injection site reactions. Discussed the risk of PML and neurologic signs/symptoms to monitor. Reviewed increased risk of depression and suicidality. Discussed that there is the possibility of an increased risk of malignancy but it is not well understood if this increased risk is due to the medication or the disease state.  Counseled patient that Benlysta should be held prior to scheduled surgery.  Counseled patient to avoid live vaccines while on Benlysta. Recommend annual influenza,  PCV 15 or PCV20 or Pneumovax 23, and Shingrix as indicated.  Reviewed the importance of regular labs while on Benlysta therapy.  Will monitor CBC and CMP at f/u visit. Will monitor TB gold annually. Standing orders placed. Provided patient with medication education material and answered all questions.  Patient voiced understanding.  Patient consented to [facility]  Will upload consent into the media tab.  Reviewed storage instructions of Benlysta.   Benlysta dose will be 200 mg once weekly.   She will continue hydroxychloroquine '400mg'$  once daily as prescribed. She will continue prednisone '10mg'$  once daily.  We reviewed that she should continue taking hydroxychloroquine through infections but Benlysta should be held to allow for adequate healing and recovery.  Demonstrated proper injection technique with Benlysta demo device  Patient able to demonstrate proper injection technique using the teach back method.  Patient self injected in the left upper thigh with:  Sample Medication: Benlysta 200/mL NDC: [id number] Lot: [id number] Expiration: 09/2022  Patient tolerated well.  Observed for 30 mins in office for adverse reaction and none noted.   Patient is to return in 1 month for labs and 6-8 weeks for follow-up appointment.  Standing orders for CBC w diff and CMP w GFR placed today  Benlysta approved through insurance .   Rx sent to: [facility]. Phone: [phone].  Patient provided with pharmacy phone number and advised to call tomorrow afternoon to schedule shipment to home.  All questions encouraged and answered.  Instructed patient to call with any further questions or concerns.  Knox Saliva, PharmD, MPH, BCPS Clinical Pharmacist (Rheumatology and Pulmonology)  08/10/2021 8:22 AM

## 2021-08-11 ENCOUNTER — Other Ambulatory Visit: Payer: Self-pay

## 2021-08-11 ENCOUNTER — Ambulatory Visit: Payer: 59 | Admitting: Pharmacist

## 2021-08-11 DIAGNOSIS — Z7189 Other specified counseling: Secondary | ICD-10-CM

## 2021-08-11 DIAGNOSIS — M329 Systemic lupus erythematosus, unspecified: Secondary | ICD-10-CM

## 2021-08-11 DIAGNOSIS — Z79899 Other long term (current) drug therapy: Secondary | ICD-10-CM

## 2021-08-11 MED ORDER — BENLYSTA 200 MG/ML ~~LOC~~ SOAJ: 200.0000 mg | SUBCUTANEOUS | 0 refills | Status: DC

## 2021-08-11 NOTE — Patient Instructions (Addendum)
Your next BENLYSTA dose is due on 9/22, 9/29, and every 7 days thereafter  CONTINUE Hydroxychloroquine as prescribed CONTINUE prednisone as prescribed. Dr. Benjamine Mola may make medication changes at your follow-up visit  HOLD BENLYSTA if you have signs or symptoms of an infection. You can resume once you feel better or back to your baseline. HOLD BENLYSTA if you start antibiotics to treat an infection. HOLD BENLYSTA around the time of surgery/procedures. Your surgeon will be able to provide recommendations on when to hold BEFORE and when you are cleared to [facility].  Pharmacy information: Your prescription will be shipped from [facility]. Their phone number is [phone] Please call tomorrow afternoon to schedule shipment and confirm address. They will mail your medication to your home.  Cost information: Your copay should be affordable. If you call the pharmacy and it is not affordable, please double-check that they are billing through your copay card as secondary coverage. That copay card information is: ID: [id number] Confirmation number: [id number] Group: [id number] Rx Bin: [id number] PCN: [id number]  How to manage an injection site reaction: Remember the 5 C's: COUNTER - leave on the counter at least 30 minutes but up to overnight to bring medication to room temperature. This may help prevent stinging COLD - place something cold (like an ice gel pack or cold water bottle) on the injection site just before cleansing with alcohol. This may help reduce pain CLARITIN - use Claritin (generic name is loratadine) for the first two weeks of treatment or the day of, the day before, and the day after injecting. This will help to minimize injection site reactions CORTISONE CREAM - apply if injection site is irritated and itching CALL ME - if injection site reaction is bigger than the size of your fist, looks infected, blisters, or if you develop hives

## 2021-08-15 ENCOUNTER — Other Ambulatory Visit: Payer: Self-pay

## 2021-08-16 ENCOUNTER — Ambulatory Visit (INDEPENDENT_AMBULATORY_CARE_PROVIDER_SITE_OTHER): Payer: 59 | Admitting: Emergency Medicine

## 2021-08-16 ENCOUNTER — Encounter: Payer: Self-pay | Admitting: Emergency Medicine

## 2021-08-16 VITALS — BP 110/60 | HR 81 | Temp 98.1°F | Ht 67.0 in | Wt 186.0 lb

## 2021-08-16 DIAGNOSIS — M545 Low back pain, unspecified: Secondary | ICD-10-CM

## 2021-08-16 DIAGNOSIS — M329 Systemic lupus erythematosus, unspecified: Secondary | ICD-10-CM

## 2021-08-16 DIAGNOSIS — Z87898 Personal history of other specified conditions: Secondary | ICD-10-CM

## 2021-08-16 DIAGNOSIS — Z7689 Persons encountering health services in other specified circumstances: Secondary | ICD-10-CM

## 2021-08-16 DIAGNOSIS — Z86018 Personal history of other benign neoplasm: Secondary | ICD-10-CM

## 2021-08-16 DIAGNOSIS — Z8673 Personal history of transient ischemic attack (TIA), and cerebral infarction without residual deficits: Secondary | ICD-10-CM

## 2021-08-16 DIAGNOSIS — Z86718 Personal history of other venous thrombosis and embolism: Secondary | ICD-10-CM

## 2021-08-16 NOTE — Patient Instructions (Signed)
Health Maintenance, Female Adopting a healthy lifestyle and getting preventive care are important in promoting health and wellness. Ask your health care provider about: The right schedule for you to have regular tests and exams. Things you can do on your own to prevent diseases and keep yourself healthy. What should I know about diet, weight, and exercise? Eat a healthy diet  Eat a diet that includes plenty of vegetables, fruits, low-fat dairy products, and lean protein. Do not eat a lot of foods that are high in solid fats, added sugars, or sodium. Maintain a healthy weight Body mass index (BMI) is used to identify weight problems. It estimates body fat based on height and weight. Your health care provider can help determine your BMI and help you achieve or maintain a healthy weight. Get regular exercise Get regular exercise. This is one of the most important things you can do for your health. Most adults should: Exercise for at least 150 minutes each week. The exercise should increase your heart rate and make you sweat (moderate-intensity exercise). Do strengthening exercises at least twice a week. This is in addition to the moderate-intensity exercise. Spend less time sitting. Even light physical activity can be beneficial. Watch cholesterol and blood lipids Have your blood tested for lipids and cholesterol at 38 years of age, then have this test every 5 years. Have your cholesterol levels checked more often if: Your lipid or cholesterol levels are high. You are older than 38 years of age. You are at high risk for heart disease. What should I know about cancer screening? Depending on your health history and family history, you may need to have cancer screening at various ages. This may include screening for: Breast cancer. Cervical cancer. Colorectal cancer. Skin cancer. Lung cancer. What should I know about heart disease, diabetes, and high blood pressure? Blood pressure and heart  disease High blood pressure causes heart disease and increases the risk of stroke. This is more likely to develop in people who have high blood pressure readings, are of African descent, or are overweight. Have your blood pressure checked: Every 3-5 years if you are 18-39 years of age. Every year if you are 40 years old or older. Diabetes Have regular diabetes screenings. This checks your fasting blood sugar level. Have the screening done: Once every three years after age 40 if you are at a normal weight and have a low risk for diabetes. More often and at a younger age if you are overweight or have a high risk for diabetes. What should I know about preventing infection? Hepatitis B If you have a higher risk for hepatitis B, you should be screened for this virus. Talk with your health care provider to find out if you are at risk for hepatitis B infection. Hepatitis C Testing is recommended for: Everyone born from 1945 through 1965. Anyone with known risk factors for hepatitis C. Sexually transmitted infections (STIs) Get screened for STIs, including gonorrhea and chlamydia, if: You are sexually active and are younger than 38 years of age. You are older than 38 years of age and your health care provider tells you that you are at risk for this type of infection. Your sexual activity has changed since you were last screened, and you are at increased risk for chlamydia or gonorrhea. Ask your health care provider if you are at risk. Ask your health care provider about whether you are at high risk for HIV. Your health care provider may recommend a prescription medicine   to help prevent HIV infection. If you choose to take medicine to prevent HIV, you should first get tested for HIV. You should then be tested every 3 months for as long as you are taking the medicine. Pregnancy If you are about to stop having your period (premenopausal) and you may become pregnant, seek counseling before you get  pregnant. Take 400 to 800 micrograms (mcg) of folic acid every day if you become pregnant. Ask for birth control (contraception) if you want to prevent pregnancy. Osteoporosis and menopause Osteoporosis is a disease in which the bones lose minerals and strength with aging. This can result in bone fractures. If you are 65 years old or older, or if you are at risk for osteoporosis and fractures, ask your health care provider if you should: Be screened for bone loss. Take a calcium or vitamin D supplement to lower your risk of fractures. Be given hormone replacement therapy (HRT) to treat symptoms of menopause. Follow these instructions at home: Lifestyle Do not use any products that contain nicotine or tobacco, such as cigarettes, e-cigarettes, and chewing tobacco. If you need help quitting, ask your health care provider. Do not use Vivar drugs. Do not share needles. Ask your health care provider for help if you need support or information about quitting drugs. Alcohol use Do not drink alcohol if: Your health care provider tells you not to drink. You are pregnant, may be pregnant, or are planning to become pregnant. If you drink alcohol: Limit how much you use to 0-1 drink a day. Limit intake if you are breastfeeding. Be aware of how much alcohol is in your drink. In the [place] one drink equals one 12 oz bottle of beer (355 mL), one 5 oz glass of wine (148 mL), or one 1 oz glass of hard liquor (44 mL). General instructions Schedule regular health, dental, and eye exams. Stay current with your vaccines. Tell your health care provider if: You often feel depressed. You have ever been abused or do not feel safe at home. Summary Adopting a healthy lifestyle and getting preventive care are important in promoting health and wellness. Follow your health care provider's instructions about healthy diet, exercising, and getting tested or screened for diseases. Follow your health care provider's  instructions on monitoring your cholesterol and blood pressure. This information is not intended to replace advice given to you by your health care provider. Make sure you discuss any questions you have with your health care provider. Document Revised: 01/21/2021 Document Reviewed: 11/06/2018 [employer] Patient Education  2022 [employer]  

## 2021-08-16 NOTE — Progress Notes (Signed)
[place] 40 y.o.   Chief Complaint  Patient presents with   New Patient (Initial Visit)    Manage chronic conditions and medications    HISTORY OF PRESENT ILLNESS: This is a 40 y.o. female first visit to this office, here to establish care with me. Has the following chronic medical problems: 1.  Systemic lupus erythematosus.  Sees Dr. Benjamine Mola, rheumatologist on a regular basis. Presently on Plaquenil 400 mg daily, prednisone 10 mg daily, and belimumab weekly injections 200 mg. 2.  History of TIAs x2 with history of high D-dimer.  Takes daily baby aspirin 3.  Hemangiomas in her liver 4.  PFO Also complaining of lower lumbar back pain for the past month and a half.  Denies injuries.  No lower extremity neurologic symptoms or any other associated symptoms. Sometime in the past she was thought to have multiple sclerosis but it was never the case.  Patient requesting removal of this diagnosis from her chart.  HPI   Prior to Admission medications   Medication Sig Start Date End Date Taking? Authorizing Provider  Belimumab (BENLYSTA) 200 MG/ML SOAJ Inject 200 mg into the skin once a week. 08/11/21  Yes Rice, Resa Miner, MD  chlorhexidine (PERIDEX) 0.12 % solution as needed. 03/29/21  Yes [provider]  cyclobenzaprine (FLEXERIL) 10 MG tablet Take 1 tablet (10 mg total) by mouth at bedtime as needed for muscle spasms. 08/06/21  Yes Rice, Resa Miner, MD  hydroxychloroquine (PLAQUENIL) 200 MG tablet Take 2 tablets (400 mg total) by mouth daily. 06/13/21  Yes Rice, Resa Miner, MD  predniSONE (DELTASONE) 10 MG tablet Take 1 tablet (10 mg total) by mouth daily with breakfast. Patient taking differently: Take 5 mg by mouth daily with breakfast. 06/13/21  Yes Rice, Resa Miner, MD  VITAMIN D PO Take 1,000 Units by mouth daily.   Yes [provider]  aspirin 325 MG tablet Take 325 mg by mouth daily. Takes every day Patient not taking: No sig reported    [provider]  magic mouthwash w/lidocaine SOLN Take 5 mLs by mouth 3 (three) times daily as needed. Patient not taking: No sig reported 10/15/20   Mauri Pole, MD  sucralfate (CARAFATE) 1 GM/10ML suspension Take 10 mLs (1 g total) by mouth 4 (four) times daily. Patient not taking: No sig reported 05/17/20   Mauri Pole, MD    Allergies  Allergen Reactions   Cephalexin Hives and Shortness Of Breath   Penicillins Hives and Anaphylaxis    SOB As child    Amitiza [Lubiprostone]    Sulfa Antibiotics Other (See Comments)    As child Doesn't remember     Patient Active Problem List   Diagnosis Date Noted   High risk medication use 07/25/2021   Pain in right ankle and joints of right foot 05/12/2021   Finger joint swelling, left 04/01/2021   Systemic lupus ([place]) 04/01/2021   Fibromyalgia 04/01/2021   Alopecia 11/17/2020   Other fatigue 10/12/2020   Epigastric pain    Multiple sclerosis ([place]) 08/26/2019   Migraine without aura    History of DVT (deep vein thrombosis)    History of endometriosis    Right-sided muscle weakness 05/15/2019   Abnormal finding on MRI of brain 05/15/2019   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   Mass of brain 12/24/2017   Tremor 12/24/2017   Numbness 12/24/2017   Urinary incontinence 12/24/2017    Past Medical History:  Diagnosis Date   Clotting  disorder (HCC) factor 5    Dysmenorrhea    GERD (gastroesophageal reflux disease)    Heart murmur  PFO    Hemangioma    History of DVT (deep vein thrombosis)    History of endometriosis    Hypotension    Migraine without aura    MS (multiple sclerosis) (HCC)    Multiple sclerosis ([place]) 08/26/2019   Neuromuscular disorder ([place])    Lupus   Stroke ([place])    ages 38 and 53    Past Surgical History:  Procedure Laterality Date   ANTERIOR CRUCIATE LIGAMENT REPAIR     PELVIC LAPAROSCOPY     endometriosis   TONSILLECTOMY      Social History   Socioeconomic History   Marital status:  Single    Spouse name: Not on file   Number of children: Not on file   Years of education: Not on file   Highest education level: Not on file  Occupational History   Not on file  Tobacco Use   Smoking status: Never   Smokeless tobacco: Never  Vaping Use   Vaping Use: Never used  Substance and Sexual Activity   Alcohol use: Yes    Alcohol/week: 2.0 standard drinks    Types: 2 Standard drinks or equivalent per week    Comment: 2 drinks per week    Drug use: Never   Sexual activity: Yes    Birth control/protection: Surgical    Comment: partner with vasectomy  Other Topics Concern   Not on file  Social History Narrative   Not on file   Social Determinants of Health   Financial Resource Strain: Not on file  Food Insecurity: Not on file  Transportation Needs: Not on file  Physical Activity: Not on file  Stress: Not on file  Social Connections: Not on file  Intimate Partner Violence: Not on file    Family History  Problem Relation Age of Onset   Prostate cancer Paternal Grandfather    Heart attack Paternal Grandfather    Esophageal cancer Paternal Grandfather    Breast cancer Other    Alcohol abuse Mother    Heart attack Father    COPD Father    Diabetes Maternal Grandmother    Cervical cancer Maternal Grandmother    Fibromyalgia Maternal Grandmother    Prostate cancer Maternal Grandfather    Heart attack Maternal Grandfather    ALS Paternal Grandmother    Colon cancer Neg Hx    Stomach cancer Neg Hx    Rectal cancer Neg Hx      Review of Systems  Constitutional: Negative.  Negative for chills and fever.  HENT: Negative.  Negative for congestion and sore throat.   Respiratory: Negative.  Negative for cough and shortness of breath.   Cardiovascular: Negative.  Negative for chest pain and palpitations.  Gastrointestinal: Negative.  Negative for abdominal pain, blood in stool, diarrhea, melena, nausea and vomiting.  Genitourinary: Negative.  Negative for dysuria  and hematuria.  Musculoskeletal:  Positive for back pain.  Skin: Negative.  Negative for rash.  Neurological:  Positive for tremors. Negative for dizziness and headaches.  All other systems reviewed and are negative.  Vitals:   08/16/21 1556  BP: 110/60  Pulse: 81  Temp: 98.1 F (36.7 C)  SpO2: 96%    Physical Exam Vitals reviewed.  Constitutional:      Appearance: Normal appearance.  HENT:     Head: Normocephalic.     Right Ear: Tympanic membrane, ear  canal and external ear normal.     Left Ear: Tympanic membrane, ear canal and external ear normal.     Mouth/Throat:     Mouth: Mucous membranes are moist.     Pharynx: Oropharynx is clear.  Eyes:     Extraocular Movements: Extraocular movements intact.     Conjunctiva/sclera: Conjunctivae normal.     Pupils: Pupils are equal, round, and reactive to light.  Neck:     Vascular: No carotid bruit.  Cardiovascular:     Rate and Rhythm: Normal rate and regular rhythm.     Pulses: Normal pulses.     Heart sounds: Normal heart sounds.  Pulmonary:     Effort: Pulmonary effort is normal.     Breath sounds: Normal breath sounds.  Musculoskeletal:        General: Normal range of motion.     Cervical back: Normal range of motion and neck supple. No tenderness.     Right lower leg: No edema.     Left lower leg: No edema.  Lymphadenopathy:     Cervical: No cervical adenopathy.  Skin:    General: Skin is warm and dry.     Capillary Refill: Capillary refill takes less than 2 seconds.  Neurological:     General: No focal deficit present.     Mental Status: She is alert and oriented to person, place, and time.  Psychiatric:        Mood and Affect: Mood normal.        Behavior: Behavior normal.     ASSESSMENT & PLAN: A total of 30 minutes was spent with the patient and counseling/coordination of care regarding preparing for this visit, review of most recent office visit notes from rheumatologist, review of past medical history,  review of all medications, comprehensive history and physical exam: Education on nutrition, prognosis, back pain and need to follow-up with sports medicine for possible physical therapy, documentation and need for follow-up.  [place] was seen today for new patient (initial visit).  Diagnoses and all orders for this visit:  Systemic lupus erythematosus, unspecified SLE type, unspecified organ involvement status ([place])  Lumbar pain -     Ambulatory referral to Sports Medicine  Encounter to establish care  History of DVT (deep vein thrombosis)  History of TIA (transient ischemic attack)  History of hemangioma  Patient Instructions  Health Maintenance, Female Adopting a healthy lifestyle and getting preventive care are important in promoting health and wellness. Ask your health care provider about: The right schedule for you to have regular tests and exams. Things you can do on your own to prevent diseases and keep yourself healthy. What should I know about diet, weight, and exercise? Eat a healthy diet  Eat a diet that includes plenty of vegetables, fruits, low-fat dairy products, and lean protein. Do not eat a lot of foods that are high in solid fats, added sugars, or sodium. Maintain a healthy weight Body mass index (BMI) is used to identify weight problems. It estimates body fat based on height and weight. Your health care provider can help determine your BMI and help you achieve or maintain a healthy weight. Get regular exercise Get regular exercise. This is one of the most important things you can do for your health. Most adults should: Exercise for at least 150 minutes each week. The exercise should increase your heart rate and make you sweat (moderate-intensity exercise). Do strengthening exercises at least twice a week. This is in addition to the  moderate-intensity exercise. Spend less time sitting. Even light physical activity can be beneficial. Watch cholesterol and blood  lipids Have your blood tested for lipids and cholesterol at 38 years of age, then have this test every 5 years. Have your cholesterol levels checked more often if: Your lipid or cholesterol levels are high. You are older than 38 years of age. You are at high risk for heart disease. What should I know about cancer screening? Depending on your health history and family history, you may need to have cancer screening at various ages. This may include screening for: Breast cancer. Cervical cancer. Colorectal cancer. Skin cancer. Lung cancer. What should I know about heart disease, diabetes, and high blood pressure? Blood pressure and heart disease High blood pressure causes heart disease and increases the risk of stroke. This is more likely to develop in people who have high blood pressure readings, are of African descent, or are overweight. Have your blood pressure checked: Every 3-5 years if you are 59-24 years of age. Every year if you are 27 years old or older. Diabetes Have regular diabetes screenings. This checks your fasting blood sugar level. Have the screening done: Once every three years after age 67 if you are at a normal weight and have a low risk for diabetes. More often and at a younger age if you are overweight or have a high risk for diabetes. What should I know about preventing infection? Hepatitis B If you have a higher risk for hepatitis B, you should be screened for this virus. Talk with your health care provider to find out if you are at risk for hepatitis B infection. Hepatitis C Testing is recommended for: Everyone born from [birth year] through 1963. Anyone with known risk factors for hepatitis C. Sexually transmitted infections (STIs) Get screened for STIs, including gonorrhea and chlamydia, if: You are sexually active and are younger than 38 years of age. You are older than 38 years of age and your health care provider tells you that you are at risk for this type of  infection. Your sexual activity has changed since you were last screened, and you are at increased risk for chlamydia or gonorrhea. Ask your health care provider if you are at risk. Ask your health care provider about whether you are at high risk for HIV. Your health care provider may recommend a prescription medicine to help prevent HIV infection. If you choose to take medicine to prevent HIV, you should first get tested for HIV. You should then be tested every 3 months for as long as you are taking the medicine. Pregnancy If you are about to stop having your period (premenopausal) and you may become pregnant, seek counseling before you get pregnant. Take 400 to 800 micrograms (mcg) of folic acid every day if you become pregnant. Ask for birth control (contraception) if you want to prevent pregnancy. Osteoporosis and menopause Osteoporosis is a disease in which the bones lose minerals and strength with aging. This can result in bone fractures. If you are 24 years old or older, or if you are at risk for osteoporosis and fractures, ask your health care provider if you should: Be screened for bone loss. Take a calcium or vitamin D supplement to lower your risk of fractures. Be given hormone replacement therapy (HRT) to treat symptoms of menopause. Follow these instructions at home: Lifestyle Do not use any products that contain nicotine or tobacco, such as cigarettes, e-cigarettes, and chewing tobacco. If you need help quitting, ask  your health care provider. Do not use [place] drugs. Do not share needles. Ask your health care provider for help if you need support or information about quitting drugs. Alcohol use Do not drink alcohol if: Your health care provider tells you not to drink. You are pregnant, may be pregnant, or are planning to become pregnant. If you drink alcohol: Limit how much you use to 0-1 drink a day. Limit intake if you are breastfeeding. Be aware of how much alcohol is in  your drink. In the [place] one drink equals one 12 oz bottle of beer (355 mL), one 5 oz glass of wine (148 mL), or one 1 oz glass of hard liquor (44 mL). General instructions Schedule regular health, dental, and eye exams. Stay current with your vaccines. Tell your health care provider if: You often feel depressed. You have ever been abused or do not feel safe at home. Summary Adopting a healthy lifestyle and getting preventive care are important in promoting health and wellness. Follow your health care provider's instructions about healthy diet, exercising, and getting tested or screened for diseases. Follow your health care provider's instructions on monitoring your cholesterol and blood pressure. This information is not intended to replace advice given to you by your health care provider. Make sure you discuss any questions you have with your health care provider. Document Revised: 01/21/2021 Document Reviewed: 11/06/2018 Elsevier Patient Education  2022 [place], MD [facility]

## 2021-08-17 ENCOUNTER — Telehealth: Payer: Self-pay | Admitting: Obstetrics and Gynecology

## 2021-08-17 ENCOUNTER — Other Ambulatory Visit (HOSPITAL_COMMUNITY)
Admission: RE | Admit: 2021-08-17 | Discharge: 2021-08-17 | Disposition: A | Discharge status: Home / Self Care | Payer: 59 | Source: Ambulatory Visit | Attending: Obstetrics and Gynecology | Admitting: Obstetrics and Gynecology

## 2021-08-17 ENCOUNTER — Other Ambulatory Visit: Payer: Self-pay

## 2021-08-17 ENCOUNTER — Encounter: Payer: Self-pay | Admitting: Obstetrics and Gynecology

## 2021-08-17 ENCOUNTER — Ambulatory Visit: Payer: 59 | Admitting: Obstetrics and Gynecology

## 2021-08-17 VITALS — BP 100/70 | HR 72 | Ht 67.0 in | Wt 185.0 lb

## 2021-08-17 DIAGNOSIS — M329 Systemic lupus erythematosus, unspecified: Secondary | ICD-10-CM

## 2021-08-17 DIAGNOSIS — N921 Excessive and frequent menstruation with irregular cycle: Secondary | ICD-10-CM

## 2021-08-17 DIAGNOSIS — Z86718 Personal history of other venous thrombosis and embolism: Secondary | ICD-10-CM | POA: Diagnosis not present

## 2021-08-17 DIAGNOSIS — Z8619 Personal history of other infectious and parasitic diseases: Secondary | ICD-10-CM | POA: Diagnosis not present

## 2021-08-17 DIAGNOSIS — Z124 Encounter for screening for malignant neoplasm of cervix: Secondary | ICD-10-CM | POA: Insufficient documentation

## 2021-08-17 DIAGNOSIS — B977 Papillomavirus as the cause of diseases classified elsewhere: Secondary | ICD-10-CM | POA: Insufficient documentation

## 2021-08-17 DIAGNOSIS — Z8742 Personal history of other diseases of the female genital tract: Secondary | ICD-10-CM

## 2021-08-17 DIAGNOSIS — D682 Hereditary deficiency of other clotting factors: Secondary | ICD-10-CM

## 2021-08-17 DIAGNOSIS — Z01419 Encounter for gynecological examination (general) (routine) without abnormal findings: Secondary | ICD-10-CM | POA: Diagnosis not present

## 2021-08-17 DIAGNOSIS — N946 Dysmenorrhea, unspecified: Secondary | ICD-10-CM | POA: Diagnosis not present

## 2021-08-17 DIAGNOSIS — N6315 Unspecified lump in the right breast, overlapping quadrants: Secondary | ICD-10-CM

## 2021-08-17 DIAGNOSIS — G43009 Migraine without aura, not intractable, without status migrainosus: Secondary | ICD-10-CM

## 2021-08-17 DIAGNOSIS — Z975 Presence of (intrauterine) contraceptive device: Secondary | ICD-10-CM

## 2021-08-17 DIAGNOSIS — Z8673 Personal history of transient ischemic attack (TIA), and cerebral infarction without residual deficits: Secondary | ICD-10-CM

## 2021-08-17 NOTE — Telephone Encounter (Signed)
Surgery: TLH/BS/possible treatment of endometriosis/cystoscopy  Diagnosis: aub, severe dysmenorrhea, h/o endometriosis  Location: [facility]  Status: Outpatient with Overnight Bed  Time: 90 Minutes  Assistant: Josefa Half, MD, Cherlynn June  Urgency: At [employer], she is interested in early December.   Pre-Op Appointment: To Be Scheduled  Post-Op Appointment(s): 1 Week, 4 Weeks  Time Out Of Work: 6 Weeks  She has a h/o factor V leiden, DVT/TIA's and lupus. Needs clearance from primary, rheumatology and probably needs to see Hematology. I need recommendations about thromboprophylaxis around surgery.

## 2021-08-17 NOTE — Progress Notes (Signed)
GYNECOLOGY  VISIT   HPI: 40 y.o.   Single White or Caucasian Not Hispanic or Latino  female   [id number] with No LMP recorded. (Menstrual status: IUD).   here for an annual exam and bleeding with her mirena IUD. She had a mirena IUD inserted in [place] in 8/21. Initially she had intermittent spotting. Then she went to monthly spotting x 4-5 days.  In August she bleed for 3-4 days, then 2 weeks later, she bleed for 7 days. She can wear a slim tampon for 8 hours, dark brown and gross. She has been bleeding every few weeks since August.  She has a lot of lower back pain.  Prior to the IUD her cycles were monthly x 3 days with severe cramps.  The cramps are better with the IUD.  She wants the IUD out and wants a hysterectomy. She can't take OCP's.  H/O DVT, TIA x 2, and lupus (previously misdiagnosed with MS).  She is on immunosuppression.   H/O umbilical hernia repair.  No LMP recorded. (Menstrual status: IUD).          Sexually active: Yes.    The current method of family planning is Vasectomy, IUD.    Exercising: Yes.     Walk, rides horses Smoker:  no  Health Maintenance: Pap:   9/20 negative, negative hpv; 2019 WNL, POS HPV 16 History of abnormal Pap:  Yes, colpo no biopsies MMG:  ~2015 BMD:   NA Colonoscopy: 2016 polyps TDaP:  unsure Gardasil: no   reports that she has never smoked. She has never used smokeless tobacco. She reports current alcohol use of about 2.0 standard drinks per week. 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  Past Medical History:  Diagnosis Date   Clotting disorder ([place]) factor 5    Dysmenorrhea    GERD (gastroesophageal reflux disease)    Heart murmur  PFO    Hemangioma    History of DVT (deep vein thrombosis)    History of endometriosis    Hypotension    Migraine without aura    Stroke ([place])    ages 54 and 76    Past Surgical History:  Procedure Laterality Date   ANTERIOR CRUCIATE  LIGAMENT REPAIR     PELVIC LAPAROSCOPY     endometriosis   TONSILLECTOMY     UMBILICAL HERNIA REPAIR      Current Outpatient Medications  Medication Sig Dispense Refill   Belimumab (BENLYSTA) 200 MG/ML SOAJ Inject 200 mg into the skin once a week. 12 mL 0   chlorhexidine (PERIDEX) 0.12 % solution as needed.     hydroxychloroquine (PLAQUENIL) 200 MG tablet Take 2 tablets (400 mg total) by mouth daily. 60 tablet 1   predniSONE (DELTASONE) 10 MG tablet Take 1 tablet (10 mg total) by mouth daily with breakfast. (Patient taking differently: Take 5 mg by mouth daily with breakfast.) 30 tablet 1   sucralfate (CARAFATE) 1 GM/10ML suspension Take 10 mLs (1 g total) by mouth 4 (four) times daily. 420 mL 1   VITAMIN D PO Take 1,000 Units by mouth daily.     No current facility-administered medications for this visit.    Family History  Problem Relation Age of Onset   Prostate cancer Paternal Grandfather    Heart attack Paternal Grandfather    Esophageal cancer Paternal Grandfather    Breast cancer Other    Alcohol abuse Mother    Heart attack Father  COPD Father    Diabetes Maternal Grandmother    Cervical cancer Maternal Grandmother    Fibromyalgia Maternal Grandmother    Prostate cancer Maternal Grandfather    Heart attack Maternal Grandfather    ALS Paternal Grandmother    Colon cancer Neg Hx    Stomach cancer Neg Hx    Rectal cancer Neg Hx     Review of Systems  Gastrointestinal:  Positive for abdominal pain.  All other systems reviewed and are negative.Intermittent cramping.   Exam:   BP 100/70   Pulse 72   Ht 5\' 7"  (1.702 m)   Wt 185 lb (83.9 kg)   SpO2 100%   BMI 28.98 kg/m   Weight change: @WEIGHTCHANGE @ Height:   Height: 5\' 7"  (170.2 cm)  Ht Readings from Last 3 Encounters:  08/17/21 5\' 7"  (1.702 m)  08/16/21 5\' 7"  (1.702 m)  07/25/21 5\' 7"  (1.702 m)    General appearance: alert, cooperative and appears stated age Head: Normocephalic, without obvious  abnormality, atraumatic Neck: no adenopathy, supple, symmetrical, trachea midline and thyroid normal to inspection and palpation Lungs: clear to auscultation bilaterally Cardiovascular: regular rate and rhythm Breasts:  bilateral fibrocystic changes, small lump at 3-4 o'clock at the periphery of the right breast Abdomen: soft, non-tender; non distended,  no masses,  no organomegaly Extremities: extremities normal, atraumatic, no cyanosis or edema Skin: Skin color, texture, turgor normal. No rashes or lesions Lymph nodes: Cervical, supraclavicular, and axillary nodes normal. No abnormal inguinal nodes palpated Neurologic: Grossly normal   Pelvic: External genitalia:  no lesions              Urethra:  normal appearing urethra with no masses, tenderness or lesions              Bartholins and Skenes: normal                 Vagina: normal appearing vagina with normal color and discharge, no lesions              Cervix: no lesions and IUD string 4 cm               Bimanual Exam:  Uterus:  normal size, contour, position, consistency, mobility, non-tender and anteverted              Adnexa: no mass, fullness, tenderness               Rectovaginal: Confirms               Anus:  normal sphincter tone, no lesions  Gae Dry chaperoned for the exam.  1. Well woman exam Normal pelvic exam.  Labs with primary/rheumatology  2. Screening for cervical cancer Patient is immunosuppressed, h/o HPV Pap with hpv testing  3. History of HPV infection Pap with hpv  4. Breakthrough bleeding with IUD - US PELVIS TRANSVAGINAL NON-OB (TV ONLY); Future -The patient has had long term issues with her cycles. Her severe dysmenorrhea is helped with the mirena, but she is having irregular bleeding that is intolerable to her. She isn't a candidate for OCP's. She desires definitive surgery. We discussed TLH/BS/possibility treatment of endometriosis, cystoscopy -H/O umbilical hernia repair, will need LUQ entry.   -Will need preop clearance and recommendations from primary and rheumatology, may need hematology consultation.   5. Severe dysmenorrhea See above - US PELVIS TRANSVAGINAL NON-OB (TV ONLY); Future  6. Migraine without aura and without status migrainosus, not intractable  7. History of DVT (deep  vein thrombosis)  8. Factor V deficiency ([place]) Will need throboprophylaxis around surgery.   9. History of endometriosis - US PELVIS TRANSVAGINAL NON-OB (TV ONLY); Future  10. History of systemic lupus erythematosus ([place]) On immunosuppressant  11. History of TIAs  12. Breast lump on right side at 3 o'clock position Diagnostic breast imaging.   In addition to the annual exam, over 30 minutes was spent in total patient care in regards to evaluation and counseling for surgery and coordination of care with her complicated medical history.

## 2021-08-17 NOTE — Patient Instructions (Signed)

## 2021-08-18 ENCOUNTER — Telehealth: Payer: Self-pay | Admitting: *Deleted

## 2021-08-18 ENCOUNTER — Encounter: Payer: Self-pay | Admitting: *Deleted

## 2021-08-18 DIAGNOSIS — N6315 Unspecified lump in the right breast, overlapping quadrants: Secondary | ICD-10-CM

## 2021-08-18 NOTE — Telephone Encounter (Signed)
Per [facility] they need the previous Images of Mammogram. I advised patient to fill out medical release with the last place she got her mammogram and fax [place] as well as [place] a copy. After we receive this [facility] will proceed

## 2021-08-18 NOTE — Telephone Encounter (Signed)
-----   Message from Salvadore Dom, MD sent at 08/17/2021  2:00 PM EDT ----- This patient needs diagnostic breast imaging, attention right breast at 3-4 o'clock. SHe was supposed to have this previously, but states the Breast center wouldn't do the imaging until she got her old mammogram images. She says she cannot get these and she needs imaging. Please figure out how to get her imaging scheduled. Thanks, Sharee Pimple

## 2021-08-19 ENCOUNTER — Other Ambulatory Visit: Payer: Self-pay

## 2021-08-19 ENCOUNTER — Ambulatory Visit (INDEPENDENT_AMBULATORY_CARE_PROVIDER_SITE_OTHER): Payer: 59

## 2021-08-19 ENCOUNTER — Ambulatory Visit (INDEPENDENT_AMBULATORY_CARE_PROVIDER_SITE_OTHER): Payer: 59 | Admitting: Sports Medicine

## 2021-08-19 VITALS — BP 100/70 | HR 90 | Ht 67.0 in | Wt 186.0 lb

## 2021-08-19 DIAGNOSIS — M9903 Segmental and somatic dysfunction of lumbar region: Secondary | ICD-10-CM

## 2021-08-19 DIAGNOSIS — M545 Low back pain, unspecified: Secondary | ICD-10-CM | POA: Diagnosis not present

## 2021-08-19 DIAGNOSIS — G8929 Other chronic pain: Secondary | ICD-10-CM

## 2021-08-19 DIAGNOSIS — M9904 Segmental and somatic dysfunction of sacral region: Secondary | ICD-10-CM | POA: Diagnosis not present

## 2021-08-19 DIAGNOSIS — M9905 Segmental and somatic dysfunction of pelvic region: Secondary | ICD-10-CM | POA: Diagnosis not present

## 2021-08-19 LAB — CYTOLOGY - PAP
Comment: NEGATIVE
Diagnosis: NEGATIVE
High risk HPV: NEGATIVE

## 2021-08-19 MED ORDER — MELOXICAM 7.5 MG PO TABS: 7.5000 mg | ORAL_TABLET | Freq: Every day | ORAL | 0 refills | Status: DC

## 2021-08-19 NOTE — Progress Notes (Signed)
Benito Mccreedy D.[place] Phone: [phone]   Assessment and Plan:     Chronic left-sided low back pain without sciatica - Plan: DG Lumbar Spine Complete, meloxicam (MOBIC) 7.5 MG tablet  Somatic dysfunction of pelvic region  Somatic dysfunction of sacral region  Somatic dysfunction of lumbar region -Chronic, no improvement, initial sports medicine visit - X-ray obtained in clinic.  My interpretation: no acute fracture, joint spaces maintained. Mildly decreased hight of posterior portion of L5 vertebrae.  - Unclear etiology at this time.  May be stemming from ergonomic and postural changes from traumatic left knee injury in 2019.  No significant radiographic abnormalities that would explain pain.  History of endometriosis with plan for hysterectomy which may be contributing. - Start meloxicam 7.5 mg as needed for pain control.  May increase to 15 mg if she can tolerate medication.  Advised to use sparingly due to history of gastritis with NSAIDs and chronic prednisone use - Tolerated OMT well.   Decision today to treat with OMT was based on Physical Exam   After verbal consent patient was treated with HVLA, ME, ST, FPR techniques in cervical, rib, thoracic, lumbar, and pelvic areas. Patient tolerated the procedure well with improvement in symptoms.  Patient educated on potential side effects of soreness and recommended to rest, hydrate, and use Tylenol as needed for pain control.  Patient has not history of brain mass to be removed from the chart.   Pertinent previous records reviewed include previous IM note, previous rheumatology note, CT abdomen   Follow Up: Follow-up in 4 weeks.  Could consider SI joint injection if no improvement in symptoms   Subjective:   I, Judy Pimple, am serving as a scribe for Dr. Glennon Mac  Chief Complaint: Low back pain   HPI: 40 year old female presenting with low  back pain   08/19/21 Patient states she has been having low back pain for about 3 months with no MOI and no numbness or tingling of lower extremities. Patient was seen by her PCP on 08/16/2021 and was referred here for treatment. Patient states that the pain is now starting to radiate up to mid back. Patient used to see a chiropractor all the time for her back. Patient does have a pain that will radiate to the hips but not down her legs.   Radiates: yes to left si and hip Aggravates: sitting, riding horses,  Treatments tried: flexeril, ice, heat,    Relevant Historical Information: Patient had a ruptured disk in the past, history of significant knee injury including ACL/PCL/MCL/meniscal tears after horse fell on her in 2019.  Peptic ulcer disease.  Lupus  Additional pertinent review of systems negative.   Current Outpatient Medications:    Belimumab (BENLYSTA) 200 MG/ML SOAJ, Inject 200 mg into the skin once a week., Disp: 12 mL, Rfl: 0   chlorhexidine (PERIDEX) 0.12 % solution, as needed., Disp: , Rfl:    hydroxychloroquine (PLAQUENIL) 200 MG tablet, Take 2 tablets (400 mg total) by mouth daily., Disp: 60 tablet, Rfl: 1   meloxicam (MOBIC) 7.5 MG tablet, Take 1 tablet (7.5 mg total) by mouth daily., Disp: 30 tablet, Rfl: 0   predniSONE (DELTASONE) 10 MG tablet, Take 1 tablet (10 mg total) by mouth daily with breakfast. (Patient taking differently: Take 5 mg by mouth daily with breakfast.), Disp: 30 tablet, Rfl: 1   sucralfate (CARAFATE) 1 GM/10ML suspension, Take 10 mLs (1 g  total) by mouth 4 (four) times daily., Disp: 420 mL, Rfl: 1   VITAMIN D PO, Take 1,000 Units by mouth daily., Disp: , Rfl:    Objective:     Vitals:   08/19/21 0948  BP: 100/70  Pulse: 90  SpO2: 99%  Weight: 186 lb (84.4 kg)  Height: 5\' 7"  (1.702 m)      Body mass index is 29.13 kg/m.    Physical Exam:    Gen: Appears well, nad, nontoxic and pleasant Psych: Alert and oriented, appropriate mood and  affect Neuro: sensation intact, strength is 5/5 in upper and lower extremities, muscle tone wnl Skin: no susupicious lesions or rashes  Back - Normal skin, Spine with normal alignment and no deformity.   No tenderness to vertebral process palpation.   Paraspinous muscles are tender for left lumbar and without spasm Straight leg raise negative Trendelenberg negative  Mild TTP of left SI and left gluteal musculature   General: Well-appearing, cooperative, sitting comfortably in no acute distress.   OMT Physical Exam:  ASIS Compression Test: Positive left Sacrum: locked left SI Lumbar: TTP left paraspinal, Pelvis: left anterior inflare innominate   OMT abbreviations: HVLA (high velocity low amplitude), ME (muscle energy), FPR (flex positional release), ST (soft tissue), TTP (tender to palpation),  Electronically signed by:  Benito Mccreedy D.[facility] 11:06 AM 08/19/21

## 2021-08-19 NOTE — Patient Instructions (Addendum)
Good to see you  Meloxicam 7.5mg  daily as needed  Low back exercises posterior chain  See me again in 4-6 weeks

## 2021-08-21 ENCOUNTER — Other Ambulatory Visit: Payer: Self-pay | Admitting: Internal Medicine

## 2021-08-21 DIAGNOSIS — M359 Systemic involvement of connective tissue, unspecified: Secondary | ICD-10-CM

## 2021-08-22 NOTE — Telephone Encounter (Signed)
Left message to return call to [facility] at [phone], Prompt 3 for Benefits.

## 2021-08-29 NOTE — Telephone Encounter (Addendum)
Spoke with the Breast center [phone] rep states they have everything they need and contacted patient. Was told bye patient she will call them back to schedule diagnostic breast imaging. My chart message sent. Unable to leave voice mail will try again

## 2021-08-31 ENCOUNTER — Encounter: Payer: Self-pay | Admitting: Sports Medicine

## 2021-08-31 ENCOUNTER — Other Ambulatory Visit: Payer: Self-pay | Admitting: Sports Medicine

## 2021-08-31 ENCOUNTER — Other Ambulatory Visit: Payer: Self-pay | Admitting: Internal Medicine

## 2021-08-31 DIAGNOSIS — G8929 Other chronic pain: Secondary | ICD-10-CM

## 2021-08-31 DIAGNOSIS — M545 Low back pain, unspecified: Secondary | ICD-10-CM

## 2021-09-01 ENCOUNTER — Ambulatory Visit (INDEPENDENT_AMBULATORY_CARE_PROVIDER_SITE_OTHER): Payer: 59

## 2021-09-01 ENCOUNTER — Other Ambulatory Visit: Payer: Self-pay

## 2021-09-01 DIAGNOSIS — N921 Excessive and frequent menstruation with irregular cycle: Secondary | ICD-10-CM

## 2021-09-01 DIAGNOSIS — N946 Dysmenorrhea, unspecified: Secondary | ICD-10-CM | POA: Diagnosis not present

## 2021-09-01 DIAGNOSIS — Z975 Presence of (intrauterine) contraceptive device: Secondary | ICD-10-CM | POA: Diagnosis not present

## 2021-09-01 DIAGNOSIS — Z8742 Personal history of other diseases of the female genital tract: Secondary | ICD-10-CM

## 2021-09-02 ENCOUNTER — Telehealth: Payer: Self-pay

## 2021-09-02 NOTE — Telephone Encounter (Signed)
Pt is scheduled for a total lapscopic hys Bilateral salpingectomy possible treatment of endometriosis and cystoscopy  for Dec 12 will Dr Sumner Boast. Pt has a history, TIA, factor 5 laidin, DVT, lupus. Surgical clearance is need along with suggestion for thrombotrophylaxin around surgery.   [place]: Lawson Radar

## 2021-09-02 NOTE — Telephone Encounter (Signed)
Spoke with patient regarding surgery benefits. Patient acknowledges understanding of information presented. Patient is aware that benefits presented are professional benefits only. Patient is aware that once surgery is scheduled, the hospital will call with separate benefits. See account note.  Routing to Jill Hamm, RN, for surgery scheduling. 

## 2021-09-02 NOTE — Telephone Encounter (Signed)
Spoke with patient. Reviewed surgery dates. Patient request to proceed with surgery on 11/07/21.   Patient is scheduled to see rheumatology on 09/09/21. Confirmed PCP and rheumatology providers. Advised I will call to initiate surgical clearance. I will f/u with any additional recommendations. Advised patient their offices may contact her directly. Patient verbalizes understanding and is agreeable.

## 2021-09-02 NOTE — Telephone Encounter (Signed)
Spoke with Shamia at [facility]. [phone]. Surgical clearance requested.   Spoke with Vincente Liberty at [facility] rheumatology/ Dr. Benjamine Mola. Surgical clearance requested.   Surgery request sent.

## 2021-09-02 NOTE — Telephone Encounter (Signed)
Sharee Pimple from [facility] called stating patient is scheduled for surgery on 11/07/21 and needs surgical clearance from Dr. Benjamine Mola.  Letter can be faxed to [phone].  If you have additional questions, please call back at [phone]

## 2021-09-02 NOTE — Telephone Encounter (Signed)
Left message to return call to [facility] at [phone], Prompt 3 for Benefits.

## 2021-09-05 ENCOUNTER — Encounter: Payer: Self-pay | Admitting: *Deleted

## 2021-09-05 NOTE — Telephone Encounter (Signed)
Do we have a standard template form for this? She is okay to go for hysterectomy no specific increased perioperative risks from her condition or for which I recommend any particular optimization. She can continue the hydroxychloroquine and low dose prednisone up through procedure. Benlysta treatment should be held 1 full week prior to surgery and can be resumed after adequate wound closure of surgical sites based on surgeon follow up assessment.

## 2021-09-05 NOTE — Telephone Encounter (Signed)
Letter faxed to requested Doctor.

## 2021-09-05 NOTE — Telephone Encounter (Signed)
Called and spoke with pt, she states that she needs surgery clearance from her rheumatologist.

## 2021-09-05 NOTE — Telephone Encounter (Signed)
To Whom It May Concern:    Christy Lowe is a patient at [facility]. She has clearance for a hysterectomy with  no specific increased perioperative risks from her condition or for which I recommend any particular optimization. Christy Lowe can continue the hydroxychloroquine and low dose prednisone up through procedure. Her Benlysta treatment should be held 1 full week prior to surgery and can be resumed after adequate wound closure of surgical sites based on surgeon follow up assessment.     If you have any questions or concerns, please don't hesitate to call.   Sincerely,       Vernelle Emerald, MD   Rheumatology clearance received.

## 2021-09-06 ENCOUNTER — Telehealth: Payer: Self-pay | Admitting: Obstetrics and Gynecology

## 2021-09-06 NOTE — Telephone Encounter (Signed)
Mychart message sent.

## 2021-09-08 NOTE — Progress Notes (Signed)
Office Visit Note  Patient: Christy Lowe             Date of Birth: 1981/05/17           MRN: [id number]             PCP: Horald Pollen, MD Referring: Collier Salina, MD Visit Date: 09/09/2021   Subjective:   History of Present Illness: Christy Lowe is a 40 y.o. female here for follow up for lupus with ongoing symptoms of primarily joint pain and swelling and skin rashes on hydroxychloroquine 400 mg PO daily, prednisone 5 mg Po daily, and after starting Benlysta 200 mg [place] weekly about 6 weeks ago.  She is now taken 5 doses she had bruising in her thigh after initial injection but since switching to abdominal injection sites this has not recurred.  She has noticed increased problems with itching in multiple sites especially on her distal arms and legs with no particular visible rashes associated except for the excoriations after her scratching.  She is using lotion very regularly for the symptoms and with dry skin.  Still has some ongoing oral ulcers.  No particular flareup of joint pains or swelling. She saw the ophthalmologist for her eye exam which was fine but also started drops for severe dryness. For about 2 weeks she also describes some change with her urination feeling decree sensation of need until having urinary urgency.  She does not notice any pain any blood or any incontinence.  Previous HPI 07/25/21 Christy Lowe is a 40 y.o. female here for follow up for systemic lupus with ongoing joint pain and inflammation primarily in the foot and ankle on hydroxychloroquine 400 mg daily and prednisone 5 mg daily. Since decreasing the steroid dose she has noticed several increased symptoms with diffuse redness and heat of her skin, an indurated area on the scalp, increased swelling around the left side base of the neck, and increased pain and numbness intermittently affecting the right foot and great toe. She also feels the associated fatigue is worse with decreased  steroid dose.   11/17/20 Christy Lowe is a 40 y.o. female here for evaluation of positive ANA. She is noticing multiple symptoms including generalized alopecia, oral dryness, left sided neck swelling, joint pain and stiffness, position tremor, and episodic skin rashes. She has had multiple ongoing problems since many years ago. She was hospitalized for viral meningitis at age 77 and feels she has been susceptible to infections ever since this time or before, with numerous cases of bronchitis and has had some lung nodules on CT imaging during these repeat episodes. About 7 years ago she suffered from a RLE DVT provoked after right knee arthroscopy and developed right sided face and arm numbness. Workup indicated she had a TIA also noted to have PFO. She takes ASA since that time and has taken anticoagulation in perioperative periods. Workup with MRI and LP studies were apparently also indicative for MS. She was treated with multiple rounds of steroids for months at a time and felt a lot of mood disturbance from these. She was never started on other immunomodulatory or imunosupressive treatments.  09/2020 ANA positive dsDNA 24   Review of Systems  Constitutional:  Positive for fatigue.  HENT:  Positive for mouth dryness.   Eyes:  Positive for dryness.  Respiratory:  Negative for shortness of breath.   Cardiovascular:  Positive for swelling in legs/feet.  Gastrointestinal:  Negative for constipation.  Endocrine:  Positive for heat intolerance.  Genitourinary:  Negative for difficulty urinating.  Musculoskeletal:  Positive for joint pain and joint pain.  Skin:  Positive for rash.  Allergic/Immunologic: Positive for susceptible to infections.  Neurological:  Positive for numbness.  Hematological:  Positive for bruising/bleeding tendency.  Psychiatric/Behavioral:  Negative for sleep disturbance.    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   Tremor 12/24/2017    Past Medical History:  Diagnosis Date   Clotting disorder (HCC) factor 5    Dysmenorrhea    GERD (gastroesophageal reflux disease)    Heart murmur  PFO    Hemangioma    History of DVT (deep vein thrombosis)    History of endometriosis    Hypotension    Migraine without aura    Stroke ([place])    ages 60 and 74    Family History  Problem Relation Age of Onset   Alcohol abuse Mother    Cancer Father        Lung and Colon   Heart attack Father    COPD Father    Diabetes Maternal Grandmother    Cervical cancer Maternal Grandmother    Fibromyalgia Maternal Grandmother    Prostate cancer Maternal Grandfather    Heart attack Maternal Grandfather    ALS Paternal Grandmother    Prostate cancer Paternal Grandfather    Heart attack Paternal Grandfather    Esophageal cancer Paternal Grandfather    Breast cancer Other    Colon cancer Neg Hx    Stomach cancer Neg Hx    Rectal cancer Neg Hx    Past Surgical History:  Procedure Laterality Date   ANTERIOR CRUCIATE LIGAMENT REPAIR     PELVIC LAPAROSCOPY     endometriosis   TONSILLECTOMY     UMBILICAL HERNIA REPAIR     Social History   Social History Narrative   Not on file   Immunization History  Administered Date(s) Administered   PFIZER(Purple Top)SARS-COV-2 Vaccination 06/27/2020, 07/28/2020   PPD Test 01/23/2019     Objective: Vital Signs: BP 113/69 (BP Location: Right Arm, Patient Position: Sitting, Cuff Size: Normal)   Pulse 88   Resp 15   Ht 5\' 7"  (1.702  m)   Wt 184 lb (83.5 kg)   BMI 28.82 kg/m    Physical Exam HENT:     Mouth/Throat:     Mouth: Mucous membranes are moist.     Pharynx: Oropharynx is clear.  Eyes:     Conjunctiva/sclera: Conjunctivae normal.  Cardiovascular:     Rate and Rhythm: Normal rate and regular rhythm.  Pulmonary:     Effort: Pulmonary effort is normal.     Breath sounds: Normal breath sounds.  Musculoskeletal:     Right lower leg: No edema.     Left lower leg: No edema.  Skin:    General: Skin is warm and dry.     Comments: Diffuse, blanching erythema on face, chest, portion of arms bilaterally with  no focal lesions  Neurological:     Mental Status: She is alert.  Psychiatric:        Mood and Affect: Mood normal.     Musculoskeletal Exam:  Shoulders full ROM no tenderness or swelling Elbows full ROM no tenderness or swelling Wrists full ROM no tenderness or swelling Fingers full ROM no tenderness or swelling Knees full ROM no tenderness or swelling Ankles full ROM no tenderness or swelling MTPs full ROM no tenderness or swelling    Investigation: No additional findings.  Imaging: DG Lumbar Spine Complete  Result Date: 08/20/2021 CLINICAL DATA:  Back pain. Patient reports low back pain for 6 weeks. No known injury. EXAM: LUMBAR SPINE - COMPLETE 4+ VIEW COMPARISON:  None. FINDINGS: There are 5 lumbar type vertebra. The alignment is maintained. Vertebral body heights are normal. There is no listhesis. The posterior elements are intact. Disc spaces are preserved. No fracture, pars defects, or focal lesion. Sacroiliac joints are symmetric and normal. IUD in the pelvis. IMPRESSION: Negative radiographs of the lumbar spine. Electronically Signed   By: Keith Rake M.D.   On: 08/20/2021 18:46   US PELVIS TRANSVAGINAL NON-OB (TV ONLY)  Result Date: 09/06/2021 Pelvic ultrasound Indications: AUB, dysmenorrhea Findings: Anteverted Uterus 7.5 x 4.41 x 2.87 cm Endometrium 3.34 mm IUD in proper  location Left ovary 2.7 x 1.08 x 1.37 cm Right ovary 3.16 x 2.04 x 1.71 cm Both ovaries are mobile No free fluid Not tender with TV ultrasound Impression: Normal sized anteverted uterus Thin, symmetrical endometrium IUD in proper postion Normal mobile ovaries bilaterally   Recent Labs: Lab Results  Component Value Date   WBC 7.6 07/25/2021   HGB 13.8 07/25/2021   PLT 263 07/25/2021   NA 138 07/25/2021   K 4.3 07/25/2021   CL 105 07/25/2021   CO2 25 07/25/2021   GLUCOSE 77 07/25/2021   BUN 17 07/25/2021   CREATININE 0.92 07/25/2021   BILITOT 0.5 07/25/2021   ALKPHOS 64 03/10/2020   AST 18 07/25/2021   ALT 12 07/25/2021   PROT 7.1 07/25/2021   ALBUMIN 4.2 03/10/2020   CALCIUM 10.0 07/25/2021   GFRAA >60 03/10/2020   QFTBGOLDPLUS NEGATIVE 07/25/2021    Speciality Comments: [facility] 08/17/2021 WNL Follow-up: 12 months   Procedures:  No procedures performed Allergies: Cephalexin, Penicillins, Amitiza [lubiprostone], and Sulfa antibiotics   Assessment / Plan:     Visit Diagnoses: Systemic lupus erythematosus, unspecified SLE type, unspecified organ involvement status ([place]) - Plan: hydroxychloroquine (PLAQUENIL) 200 MG tablet  Ongoing symptoms with fatigue, arthralgias, skin rashes so far not a great improvement after starting Benlysta although not very long on medication. She is still taking prednisone 10 much worse with further tapering. Skin rash and itching doing worse, I don't think this is medication related and has significant erythematous rash. Plan to continue HCQ 400 mg daily, prednisone 10 mg daily, and benlysta 200 mg [place] weekly for now. If additional benefit at follow up we will try again at tapering prednisone else consider treatment change.  High risk medication use - Plan: CBC with Differential/Platelet, COMPLETE METABOLIC PANEL WITH GFR  New benlysta treatment for 6 weeks no major adverse events will check CBC and CPM for medication  monitoring.  Fibromyalgia  Urinary urgency  Urinary urgency and what sounds like stress incontinence. Not sure if directly related with endometriosis problems, does not sound typical for UTI for her.  History of endometriosis  Plans for upcoming hysterectomy. Will need  to interrupt benlysta perioperatively other treatments okay.  Itching - Plan: hydrOXYzine (ATARAX/VISTARIL) 25 MG tablet  Increased skin itching not exactly sure of cause. Already on daily prednisone so doubt additional steroids would be helpful. Recommend trial of adding hydroxyzine low dose as needed for this.    Orders: Orders Placed This Encounter  Procedures   CBC with Differential/Platelet   COMPLETE METABOLIC PANEL WITH GFR   Meds ordered this encounter  Medications   hydrOXYzine (ATARAX/VISTARIL) 25 MG tablet    Sig: Take 1 tablet (25 mg total) by mouth at bedtime as needed for itching.    Dispense:  30 tablet    Refill:  1   hydroxychloroquine (PLAQUENIL) 200 MG tablet    Sig: Take 2 tablets (400 mg total) by mouth daily.    Dispense:  60 tablet    Refill:  2     Follow-Up Instructions: Return in about 3 months (around 12/10/2021) for SLE HCQ+BLY f/u 34mos.   Collier Salina, MD  Note - This record has been created using [employer].  Chart creation errors have been sought, but may not always  have been located. Such creation errors do not reflect on  the standard of medical care.

## 2021-09-08 NOTE — Telephone Encounter (Signed)
Spoke with patient. Patient called requesting to reschedule surgery to earlier date, 10/31/21. Advised will reschedule surgery to 10/31/21. Pre-op scheduled. I will return call to review pre-op instructions. Patient verbalizes understanding and is agreeable.   Call placed to central scheduling, spoke with Sharee Pimple, case rescheduled to 10/31/21.   Routing to [employer].

## 2021-09-09 ENCOUNTER — Encounter: Payer: Self-pay | Admitting: Internal Medicine

## 2021-09-09 ENCOUNTER — Ambulatory Visit (INDEPENDENT_AMBULATORY_CARE_PROVIDER_SITE_OTHER): Payer: 59 | Admitting: Internal Medicine

## 2021-09-09 ENCOUNTER — Other Ambulatory Visit: Payer: Self-pay

## 2021-09-09 VITALS — BP 113/69 | HR 88 | Resp 15 | Ht 67.0 in | Wt 184.0 lb

## 2021-09-09 DIAGNOSIS — M359 Systemic involvement of connective tissue, unspecified: Secondary | ICD-10-CM

## 2021-09-09 DIAGNOSIS — Z8742 Personal history of other diseases of the female genital tract: Secondary | ICD-10-CM

## 2021-09-09 DIAGNOSIS — M797 Fibromyalgia: Secondary | ICD-10-CM | POA: Diagnosis not present

## 2021-09-09 DIAGNOSIS — L299 Pruritus, unspecified: Secondary | ICD-10-CM

## 2021-09-09 DIAGNOSIS — Z79899 Other long term (current) drug therapy: Secondary | ICD-10-CM | POA: Diagnosis not present

## 2021-09-09 DIAGNOSIS — R3915 Urgency of urination: Secondary | ICD-10-CM | POA: Insufficient documentation

## 2021-09-09 DIAGNOSIS — M329 Systemic lupus erythematosus, unspecified: Secondary | ICD-10-CM

## 2021-09-09 MED ORDER — PREDNISONE 5 MG PO TABS: 5.0000 mg | ORAL_TABLET | Freq: Every day | ORAL | 2 refills | Status: DC

## 2021-09-09 MED ORDER — HYDROXYZINE HCL 25 MG PO TABS: 25.0000 mg | ORAL_TABLET | Freq: Every evening | ORAL | 1 refills | Status: DC | PRN

## 2021-09-09 MED ORDER — HYDROXYCHLOROQUINE SULFATE 200 MG PO TABS: 400.0000 mg | ORAL_TABLET | Freq: Every day | ORAL | 2 refills | Status: DC

## 2021-09-10 LAB — CBC WITH DIFFERENTIAL/PLATELET
Absolute Monocytes: 551 cells/uL (ref 200–950)
Basophils Absolute: 43 cells/uL (ref 0–200)
Basophils Relative: 0.4 %
Eosinophils Absolute: 43 cells/uL (ref 15–500)
Eosinophils Relative: 0.4 %
HCT: 42.4 % (ref 35.0–45.0)
Hemoglobin: 14.4 g/dL (ref 11.7–15.5)
Lymphs Abs: 1372 cells/uL (ref 850–3900)
MCH: 30.9 pg (ref 27.0–33.0)
MCHC: 34 g/dL (ref 32.0–36.0)
MCV: 91 fL (ref 80.0–100.0)
MPV: 9.6 fL (ref 7.5–12.5)
Monocytes Relative: 5.1 %
Neutro Abs: 8791 cells/uL — ABNORMAL HIGH (ref 1500–7800)
Neutrophils Relative %: 81.4 %
Platelets: 289 10*3/uL (ref 140–400)
RBC: 4.66 10*6/uL (ref 3.80–5.10)
RDW: 12 % (ref 11.0–15.0)
Total Lymphocyte: 12.7 %
WBC: 10.8 10*3/uL (ref 3.8–10.8)

## 2021-09-10 LAB — COMPLETE METABOLIC PANEL WITH GFR
AG Ratio: 1.8 (calc) (ref 1.0–2.5)
ALT: 13 U/L (ref 6–29)
AST: 18 U/L (ref 10–30)
Albumin: 4.4 g/dL (ref 3.6–5.1)
Alkaline phosphatase (APISO): 47 U/L (ref 31–125)
BUN/Creatinine Ratio: 11 (calc) (ref 6–22)
BUN: 12 mg/dL (ref 7–25)
CO2: 26 mmol/L (ref 20–32)
Calcium: 9.4 mg/dL (ref 8.6–10.2)
Chloride: 103 mmol/L (ref 98–110)
Creat: 1.05 mg/dL — ABNORMAL HIGH (ref 0.50–0.97)
Globulin: 2.5 g/dL (calc) (ref 1.9–3.7)
Glucose, Bld: 81 mg/dL (ref 65–99)
Potassium: 4.2 mmol/L (ref 3.5–5.3)
Sodium: 138 mmol/L (ref 135–146)
Total Bilirubin: 0.5 mg/dL (ref 0.2–1.2)
Total Protein: 6.9 g/dL (ref 6.1–8.1)
eGFR: 70 mL/min/{1.73_m2} (ref >=60)

## 2021-09-16 NOTE — Progress Notes (Signed)
Labs look fine no change in blood count or renal or liver function since stating benlysta, which is good.

## 2021-09-19 NOTE — Progress Notes (Signed)
We can recheck labs in 3 months at next clinic f/u.

## 2021-09-20 NOTE — Telephone Encounter (Signed)
Needs office visit for preop clearance.

## 2021-09-20 NOTE — Telephone Encounter (Signed)
Christy Lowe  [phone] Fax to: [phone]  Surgical clearance needed for surgery on 12.5.22 No form, just needs letter from MD  [place] needs to provide surgical clearance for hx of DVT/TIA documented history of factor 5 leiden, and also provide recommendations about thromboprophylaxis around surgery  Please reference telephone note from 9.21.22 Sumner Boast) for clarity

## 2021-09-20 NOTE — Telephone Encounter (Signed)
Call placed to [facility] PCP.  Requested update on surgery clearance request on 09/02/21. Please update. Was advised will f/u with provider and update by fax notification.

## 2021-09-21 NOTE — Telephone Encounter (Signed)
Called and left a vm to call back

## 2021-10-03 NOTE — Telephone Encounter (Signed)
Per review of Epic, patient is scheduled for OV with PCP on 11/9 for surgical clearance.

## 2021-10-04 ENCOUNTER — Telehealth: Payer: Self-pay

## 2021-10-04 ENCOUNTER — Other Ambulatory Visit: Payer: Self-pay | Admitting: Internal Medicine

## 2021-10-04 DIAGNOSIS — L299 Pruritus, unspecified: Secondary | ICD-10-CM

## 2021-10-04 NOTE — Telephone Encounter (Signed)
Patient advised the prescription for Flexeril was sent to the pharmacy. Patient advised she should have a refill on the Hydroxyzine at the pharmacy as it was sent with an additional refill last month. Patient expressed understanding.

## 2021-10-04 NOTE — Telephone Encounter (Signed)
Next Visit: 12/13/2021  Last Visit: 09/09/2021  Last Fill: 08/31/2021  Dx: Systemic lupus erythematosus, unspecified SLE type, unspecified organ involvement status   Current Dose per last prescription 08/31/2021: TAKE 1 TABLET BY MOUTH AT BEDTIME AS NEEDED FOR MUSCLE SPASMS  Okay to refill Flexeril?

## 2021-10-04 NOTE — Telephone Encounter (Signed)
Patient called requesting prescription refills of Cyclobenzaprine and Hydroxyzine to be sent to [facility] in [place].     Patient states she ran out of her prescription of Prednisone early because it was written for 5 mg per day and she is still taking 10 mg per day.  Patient states "she was under the impression that she was suppose to continue taking 10 mg until they knew if the Benlysta was going to work."

## 2021-10-05 ENCOUNTER — Ambulatory Visit: Payer: 59 | Admitting: Emergency Medicine

## 2021-10-05 ENCOUNTER — Encounter: Payer: 59 | Admitting: Obstetrics and Gynecology

## 2021-10-06 ENCOUNTER — Other Ambulatory Visit: Payer: Self-pay

## 2021-10-06 ENCOUNTER — Ambulatory Visit (INDEPENDENT_AMBULATORY_CARE_PROVIDER_SITE_OTHER): Payer: 59 | Admitting: Emergency Medicine

## 2021-10-06 VITALS — BP 112/62 | HR 90 | Ht 67.0 in | Wt 187.0 lb

## 2021-10-06 DIAGNOSIS — Z01818 Encounter for other preprocedural examination: Secondary | ICD-10-CM

## 2021-10-06 DIAGNOSIS — M329 Systemic lupus erythematosus, unspecified: Secondary | ICD-10-CM | POA: Diagnosis not present

## 2021-10-06 NOTE — Patient Instructions (Signed)

## 2021-10-06 NOTE — Progress Notes (Signed)
Subjective:    Christy Lowe is a 40 y.o. female who presents to the office today for a preoperative consultation at the request of surgeon Gynecologist who plans on performing hysterectomy next month. This consultation is requested for preop clearance. Planned anesthesia: general. The patient has the following known anesthesia issues:  None . Patients bleeding risk: no recent abnormal bleeding. Patient does not have objections to receiving blood products if needed.  The following portions of the patient's history were reviewed and updated as appropriate: allergies, current medications, past family history, past medical history, past social history, past surgical history, and problem list.  Review of Systems A comprehensive review of systems was negative.    Objective:    BP 112/62   Pulse 90   Ht _0  (1.702 m)   Wt 187 lb (84.8 kg)   LMP 09/23/2021   SpO2 97%   BMI 29.29 kg/m   General Appearance:    Alert, cooperative, no distress, appears stated age  Head:    Normocephalic, without obvious abnormality, atraumatic  Eyes:    PERRL, conjunctiva/corneas clear, EOM's intact  Ears:    Normal TM's and external ear canals, both ears  Nose:   Nares normal, septum midline, mucosa normal, no drainage    or sinus tenderness  Throat:   Lips, mucosa, and tongue normal; teeth and gums normal  Neck:   Supple, symmetrical, trachea midline, no adenopathy;    thyroid:  no enlargement/tenderness/nodules; no carotid   bruit or JVD  Back:     Symmetric, no curvature, ROM normal, no CVA tenderness  Lungs:     Clear to auscultation bilaterally, respirations unlabored  Chest Wall:    No tenderness or deformity   Heart:    Regular rate and rhythm, S1 and S2 normal, no murmur, rub   or gallop     Abdomen:     Soft, non-tender, bowel sounds active all four quadrants,    no masses, no organomegaly        Extremities:   Extremities normal, atraumatic, no cyanosis or edema  Pulses:   2+ and  symmetric all extremities  Skin:   Skin color, texture, turgor normal, no rashes or lesions  Lymph nodes:   Cervical, supraclavicular, and axillary nodes normal  Neurologic:   CNII-XII intact, normal strength, sensation and reflexes    throughout    Predictors of intubation difficulty:  Morbid obesity? no  Anatomically abnormal facies? no  Prominent incisors? no  Receding mandible? no  Short, thick neck? no  Neck range of motion: normal  Mallampati score: I (soft palate, uvula, fauces, and tonsillar pillars visible)  Cardiographics ECG 04/11/21: normal sinus rhythm, no blocks or conduction defects, no ischemic changes Echocardiogram: not done  Imaging Chest x-ray 04/11/21: normal   Lab Review  Office Visit on 09/09/2021  Component Date Value   WBC 09/09/2021 10.8    RBC 09/09/2021 4.66    Hemoglobin 09/09/2021 14.4    HCT 09/09/2021 42.4    MCV 09/09/2021 91.0    MCH 09/09/2021 30.9    MCHC 09/09/2021 34.0    RDW 09/09/2021 12.0    Platelets 09/09/2021 289    MPV 09/09/2021 9.6    Neutro Abs 09/09/2021 8,791 (A)    Lymphs Abs 09/09/2021 1,372    Absolute Monocytes 09/09/2021 551    Eosinophils Absolute 09/09/2021 43    Basophils Absolute 09/09/2021 43    Neutrophils Relative % 09/09/2021 81.4    Total Lymphocyte 09/09/2021 12.7  Monocytes Relative 09/09/2021 5.1    Eosinophils Relative 09/09/2021 0.4    Basophils Relative 09/09/2021 0.4    Glucose, Bld 09/09/2021 81    BUN 09/09/2021 12    Creat 09/09/2021 1.05 (A)    eGFR 09/09/2021 70    BUN/Creatinine Ratio 09/09/2021 11    Sodium 09/09/2021 138    Potassium 09/09/2021 4.2    Chloride 09/09/2021 103    CO2 09/09/2021 26    Calcium 09/09/2021 9.4    Total Protein 09/09/2021 6.9    Albumin 09/09/2021 4.4    Globulin 09/09/2021 2.5    AG Ratio 09/09/2021 1.8    Total Bilirubin 09/09/2021 0.5    Alkaline phosphatase (AP* 09/09/2021 47    AST 09/09/2021 18    ALT 09/09/2021 13   Office Visit on 08/17/2021   Component Date Value   High risk HPV 08/17/2021 Negative    Adequacy 08/17/2021 Satisfactory for evaluation; transformation zone component PRESENT.    Diagnosis 08/17/2021 - Negative for intraepithelial lesion or malignancy (NILM)    Comment 08/17/2021 Normal Reference Range HPV - Negative       Assessment:     Problem List Items Addressed This Visit       Other   Systemic lupus ([place])   Other Visit Diagnoses     Preoperative examination    -  Primary        40 y.o. female with planned surgery as above.   Known risk factors for perioperative complications: None   Difficulty with intubation is not anticipated.  Cardiac Risk Estimation: low Medically cleared for surgery.  Current medications which may produce withdrawal symptoms if withheld perioperatively: none Not presently taking aspirin or any other anticoagulant.   Plan:    1. Preoperative workup as follows: Comprehensive history and physical examination.  Review of most recent blood work, chest x-ray, and EKG. 2. Change in medication regimen before surgery: none, continue medication regimen including morning of surgery, with sip of water. 3. Prophylaxis for cardiac events with perioperative beta-blockers: not indicated. 4. Invasive hemodynamic monitoring perioperatively: at the discretion of anesthesiologist. 5. Deep vein thrombosis prophylaxis postoperatively:regimen to be chosen by surgical team. 6. Surveillance for postoperative MI with ECG immediately postoperatively and on postoperative days 1 and 2 AND troponin levels 24 hours postoperatively and on day 4 or hospital discharge (whichever comes first): at the discretion of anesthesiologist. 7. Other measures:  none

## 2021-10-07 ENCOUNTER — Encounter: Payer: Self-pay | Admitting: Obstetrics and Gynecology

## 2021-10-07 ENCOUNTER — Ambulatory Visit (INDEPENDENT_AMBULATORY_CARE_PROVIDER_SITE_OTHER): Payer: 59 | Admitting: Obstetrics and Gynecology

## 2021-10-07 VITALS — BP 124/80

## 2021-10-07 DIAGNOSIS — Z9889 Other specified postprocedural states: Secondary | ICD-10-CM

## 2021-10-07 DIAGNOSIS — Z8742 Personal history of other diseases of the female genital tract: Secondary | ICD-10-CM | POA: Diagnosis not present

## 2021-10-07 DIAGNOSIS — Z8719 Personal history of other diseases of the digestive system: Secondary | ICD-10-CM

## 2021-10-07 DIAGNOSIS — Z86718 Personal history of other venous thrombosis and embolism: Secondary | ICD-10-CM

## 2021-10-07 DIAGNOSIS — N921 Excessive and frequent menstruation with irregular cycle: Secondary | ICD-10-CM | POA: Diagnosis not present

## 2021-10-07 DIAGNOSIS — Z8673 Personal history of transient ischemic attack (TIA), and cerebral infarction without residual deficits: Secondary | ICD-10-CM

## 2021-10-07 DIAGNOSIS — Z975 Presence of (intrauterine) contraceptive device: Secondary | ICD-10-CM

## 2021-10-07 DIAGNOSIS — M329 Systemic lupus erythematosus, unspecified: Secondary | ICD-10-CM

## 2021-10-07 DIAGNOSIS — N946 Dysmenorrhea, unspecified: Secondary | ICD-10-CM

## 2021-10-07 NOTE — Progress Notes (Signed)
GYNECOLOGY  VISIT   HPI: 40 y.o.   Single White or Caucasian Not Hispanic or Latino  female   G0P0000 with Patient's last menstrual period was 09/23/2021.   here for  pre op consult for hysterectomy. The patient has had long term issues with her cycles. Her severe dysmenorrhea is helped with the mirena IUD, but she has intolerable bleeding with the IUD. She isn't a candidate for OCP's and desires definitive treatment.   Ultrasound on 09/06/21: Impression:  Normal sized anteverted uterus Thin, symmetrical endometrium IUD in proper postion Normal mobile ovaries bilaterally   Pap 08/17/21 Negative, negative HPV   H/O DVT, TIA x 2, PFO, and lupus (previously misdiagnosed with MS).   She had a negative thrombophilia w/u with Dr Irene Limbo in 7/20.  In 12/21 she had negative lupus anticoagulant and anticardiolipin antibodies.  She is on immunosuppression.    H/O umbilical hernia repair.    GYNECOLOGIC HISTORY: Patient's last menstrual period was 09/23/2021. Contraception:Vasectomy Menopausal hormone therapy: none        OB History     Gravida  0   Para  0   Term  0   Preterm  0   AB  0   Living  0      SAB  0   IAB  0   Ectopic  0   Multiple  0   Live Births  0              Patient Active Problem List   Diagnosis Date Noted   Itching 09/09/2021   High risk medication use 07/25/2021   Finger joint swelling, left 04/01/2021   Systemic lupus ([place]) 04/01/2021   Fibromyalgia 04/01/2021   Alopecia 11/17/2020   Other fatigue 10/12/2020   Migraine without aura    History of DVT (deep vein thrombosis)    History of endometriosis    Right-sided muscle weakness 05/15/2019   Abnormal finding on MRI of brain 05/15/2019   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 Was told it didn't explain her pain.   Current Outpatient Medications  Medication Sig Dispense Refill   Belimumab (BENLYSTA) 200 MG/ML SOAJ Inject 200 mg into the skin once a week. 12 mL 0   chlorhexidine (PERIDEX) 0.12 % solution as needed.     cyclobenzaprine (FLEXERIL) 10 MG tablet TAKE 1 TABLET BY MOUTH AT BEDTIME AS NEEDED FOR MUSCLE SPASMS. 30 tablet 1   hydroxychloroquine (PLAQUENIL) 200 MG tablet Take 2 tablets (400 mg total) by mouth daily. 60 tablet 2   hydrOXYzine (ATARAX/VISTARIL) 25 MG tablet Take 1 tablet (25 mg total) by mouth at bedtime as needed for itching. 30 tablet 1   predniSONE (DELTASONE) 5 MG tablet Take 1 tablet (5 mg total) by mouth daily with breakfast. 30  tablet 2   sucralfate (CARAFATE) 1 GM/10ML suspension Take 10 mLs (1 g total) by mouth 4 (four) times daily. 420 mL 1   VITAMIN D PO Take 1,000 Units by mouth daily.     No current facility-administered medications for this visit.     ALLERGIES: Cephalexin, Penicillins, Amitiza [lubiprostone], and Sulfa antibiotics  Family History  Problem Relation Age of Onset   Alcohol abuse Mother    Cancer Father        Lung and Colon   Heart attack Father    COPD Father    Diabetes Maternal Grandmother    Cervical cancer Maternal Grandmother    Fibromyalgia Maternal Grandmother    Prostate cancer Maternal Grandfather    Heart attack Maternal Grandfather    ALS Paternal Grandmother    Prostate cancer Paternal Grandfather    Heart attack Paternal Grandfather    Esophageal cancer Paternal Grandfather    Breast cancer Other    Colon cancer Neg Hx     Stomach cancer Neg Hx    Rectal cancer Neg Hx     Social History   Socioeconomic History   Marital status: Single    Spouse name: Not on file   Number of children: Not on file   Years of education: Not on file   Highest education level: Not on file  Occupational History   Not on file  Tobacco Use   Smoking status: Never   Smokeless tobacco: Never  Vaping Use   Vaping Use: Never used  Substance and Sexual Activity   Alcohol use: Not Currently   Drug use: Never   Sexual activity: Yes    Birth control/protection: Surgical    Comment: partner with vasectomy  Other Topics Concern   Not on file  Social History Narrative   Not on file   Social Determinants of Health   Financial Resource Strain: Not on file  Food Insecurity: Not on file  Transportation Needs: Not on file  Physical Activity: Not on file  Stress: Not on file  Social Connections: Not on file  Intimate Partner Violence: Not on file    ROS  PHYSICAL EXAMINATION:    BP 124/80   LMP 09/23/2021     General appearance: alert, cooperative and appears stated age Neck: no adenopathy, supple, symmetrical, trachea midline and thyroid normal to inspection and palpation Heart: regular rate and rhythm Lungs: CTAB Abdomen: soft, non-tender; bowel sounds normal; no masses,  no organomegaly Extremities: normal, atraumatic, no cyanosis Skin: normal color, texture and turgor, no rashes or lesions Lymph: normal cervical supraclavicular and inguinal nodes Neurologic: grossly normal   1. Severe dysmenorrhea Helped with the mirena, but having intolerable BTB. Normal ultrasound and exam. H/O endometriosis. She desires definitive surgery. Discussed total laparoscopic hysterectomy, bilateral salpingectomies, possible treatment of endometriosis and cystoscopy. Reviewed the risks of the procedure, including infection, bleeding, damage to bowel/badder/vessels/ureters. Discussed post operative recovery and risk of cuff  dehiscence. All of her questions were answered  2. History of endometriosis See above  3. Breakthrough bleeding with IUD See above  4. History of DVT (deep vein thrombosis) Negative thrombophilia w/u with Dr Irene Limbo in 2020 Negative lupus anticoagulant and anticardiolipin antibodies in 12/21 Caprini score of 5  5. History of systemic lupus erythematosus ([place]) Followed by Rheumatology  6. History of TIAs  7. History of umbilical hernia repair Needs left upper quadrant entry   Addendum: notes have been send to Dr Benjamine Mola and Dr Irene Limbo regarding medical management around  her surgery.

## 2021-10-07 NOTE — H&P (View-Only) (Signed)
GYNECOLOGY  VISIT   HPI: 40 y.o.   Single White or Caucasian Not Hispanic or Latino  female   G0P0000 with Patient's last menstrual period was 09/23/2021.   here for  pre op consult for hysterectomy. The patient has had long term issues with her cycles. Her severe dysmenorrhea is helped with the mirena IUD, but she has intolerable bleeding with the IUD. She isn't a candidate for OCP's and desires definitive treatment.   Ultrasound on 09/06/21: Impression:  Normal sized anteverted uterus Thin, symmetrical endometrium IUD in proper postion Normal mobile ovaries bilaterally   Pap 08/17/21 Negative, negative HPV   H/O DVT, TIA x 2, PFO, and lupus (previously misdiagnosed with MS).   She had a negative thrombophilia w/u with Dr Irene Limbo in 7/20.  In 12/21 she had negative lupus anticoagulant and anticardiolipin antibodies.  She is on immunosuppression.    H/O umbilical hernia repair.    GYNECOLOGIC HISTORY: Patient's last menstrual period was 09/23/2021. Contraception:Vasectomy Menopausal hormone therapy: none        OB History     Gravida  0   Para  0   Term  0   Preterm  0   AB  0   Living  0      SAB  0   IAB  0   Ectopic  0   Multiple  0   Live Births  0              Patient Active Problem List   Diagnosis Date Noted   Itching 09/09/2021   High risk medication use 07/25/2021   Finger joint swelling, left 04/01/2021   Systemic lupus ([place]) 04/01/2021   Fibromyalgia 04/01/2021   Alopecia 11/17/2020   Other fatigue 10/12/2020   Migraine without aura    History of DVT (deep vein thrombosis)    History of endometriosis    Right-sided muscle weakness 05/15/2019   Abnormal finding on MRI of brain 05/15/2019   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 Was told it didn't explain her pain.   Current Outpatient Medications  Medication Sig Dispense Refill   Belimumab (BENLYSTA) 200 MG/ML SOAJ Inject 200 mg into the skin once a week. 12 mL 0   chlorhexidine (PERIDEX) 0.12 % solution as needed.     cyclobenzaprine (FLEXERIL) 10 MG tablet TAKE 1 TABLET BY MOUTH AT BEDTIME AS NEEDED FOR MUSCLE SPASMS. 30 tablet 1   hydroxychloroquine (PLAQUENIL) 200 MG tablet Take 2 tablets (400 mg total) by mouth daily. 60 tablet 2   hydrOXYzine (ATARAX/VISTARIL) 25 MG tablet Take 1 tablet (25 mg total) by mouth at bedtime as needed for itching. 30 tablet 1   predniSONE (DELTASONE) 5 MG tablet Take 1 tablet (5 mg total) by mouth daily with breakfast. 30  tablet 2   sucralfate (CARAFATE) 1 GM/10ML suspension Take 10 mLs (1 g total) by mouth 4 (four) times daily. 420 mL 1   VITAMIN D PO Take 1,000 Units by mouth daily.     No current facility-administered medications for this visit.     ALLERGIES: Cephalexin, Penicillins, Amitiza [lubiprostone], and Sulfa antibiotics  Family History  Problem Relation Age of Onset   Alcohol abuse Mother    Cancer Father        Lung and Colon   Heart attack Father    COPD Father    Diabetes Maternal Grandmother    Cervical cancer Maternal Grandmother    Fibromyalgia Maternal Grandmother    Prostate cancer Maternal Grandfather    Heart attack Maternal Grandfather    ALS Paternal Grandmother    Prostate cancer Paternal Grandfather    Heart attack Paternal Grandfather    Esophageal cancer Paternal Grandfather    Breast cancer Other    Colon cancer Neg Hx     Stomach cancer Neg Hx    Rectal cancer Neg Hx     Social History   Socioeconomic History   Marital status: Single    Spouse name: Not on file   Number of children: Not on file   Years of education: Not on file   Highest education level: Not on file  Occupational History   Not on file  Tobacco Use   Smoking status: Never   Smokeless tobacco: Never  Vaping Use   Vaping Use: Never used  Substance and Sexual Activity   Alcohol use: Not Currently   Drug use: Never   Sexual activity: Yes    Birth control/protection: Surgical    Comment: partner with vasectomy  Other Topics Concern   Not on file  Social History Narrative   Not on file   Social Determinants of Health   Financial Resource Strain: Not on file  Food Insecurity: Not on file  Transportation Needs: Not on file  Physical Activity: Not on file  Stress: Not on file  Social Connections: Not on file  Intimate Partner Violence: Not on file    ROS  PHYSICAL EXAMINATION:    BP 124/80   LMP 09/23/2021     General appearance: alert, cooperative and appears stated age Neck: no adenopathy, supple, symmetrical, trachea midline and thyroid normal to inspection and palpation Heart: regular rate and rhythm Lungs: CTAB Abdomen: soft, non-tender; bowel sounds normal; no masses,  no organomegaly Extremities: normal, atraumatic, no cyanosis Skin: normal color, texture and turgor, no rashes or lesions Lymph: normal cervical supraclavicular and inguinal nodes Neurologic: grossly normal   1. Severe dysmenorrhea Helped with the mirena, but having intolerable BTB. Normal ultrasound and exam. H/O endometriosis. She desires definitive surgery. Discussed total laparoscopic hysterectomy, bilateral salpingectomies, possible treatment of endometriosis and cystoscopy. Reviewed the risks of the procedure, including infection, bleeding, damage to bowel/badder/vessels/ureters. Discussed post operative recovery and risk of cuff  dehiscence. All of her questions were answered  2. History of endometriosis See above  3. Breakthrough bleeding with IUD See above  4. History of DVT (deep vein thrombosis) Negative thrombophilia w/u with Dr Irene Limbo in 2020 Negative lupus anticoagulant and anticardiolipin antibodies in 12/21 Caprini score of 5  5. History of systemic lupus erythematosus ([place]) Followed by Rheumatology  6. History of TIAs  7. History of umbilical hernia repair Needs left upper quadrant entry   Addendum: notes have been send to Dr Benjamine Mola and Dr Irene Limbo regarding medical management around  her surgery.

## 2021-10-09 ENCOUNTER — Encounter: Payer: Self-pay | Admitting: Emergency Medicine

## 2021-10-09 ENCOUNTER — Encounter: Payer: Self-pay | Admitting: Internal Medicine

## 2021-10-09 ENCOUNTER — Encounter: Payer: Self-pay | Admitting: Obstetrics and Gynecology

## 2021-10-11 ENCOUNTER — Telehealth: Payer: Self-pay | Admitting: Obstetrics and Gynecology

## 2021-10-11 NOTE — Telephone Encounter (Signed)
-----   Message from Collier Salina, MD sent at 10/10/2021  8:22 AM EST ----- That sounds right for perioperative management for benlysta, would only need adjustment if some kind of complication or delayed wound closure. She takes 5-10 mg daily prednisone and can stay at that should not be any significant risk factor. I have no specific other recommendations. Thanks, Ignacia Marvel ----- Message ----- From: Salvadore Dom, MD Sent: 10/07/2021   2:44 PM EST To: Collier Salina, MD  Hi Dr Benjamine Mola, Ms Dominic is scheduled for a laparoscopic hysterectomy in early December, typically patients go home the same day.  I have reached out to the Hematologist about his recommendations for Thrombophrophylaxis in her.  She told me that you recommended she stop her Benlysta 2 weeks prior to surgery and restart it one week after surgery.  Do you have any other recommendations about her care prior to or after surgery? Thank you for your help. Sumner Boast

## 2021-10-11 NOTE — Telephone Encounter (Signed)
Mychart message sent.

## 2021-10-11 NOTE — Telephone Encounter (Signed)
No clearance received from PCP to date.  Rheumatology Clearance received, see message below.  Pre-op completed on 10/07/21.  Surgery is scheduled for 10/31/21.   Dr. Talbert Nan -please advise on additional clearance needed.

## 2021-10-11 NOTE — Telephone Encounter (Signed)
Salvadore Dom, MD  Burnice Logan, RN Cleared by internal medicine and rheumatology. Last week I sent a note to the hematologist she saw in 2020 and am waiting to hear back.  Thanks,  Sharee Pimple

## 2021-10-13 ENCOUNTER — Encounter: Payer: Self-pay | Admitting: *Deleted

## 2021-10-13 NOTE — Telephone Encounter (Signed)
Call place to [facility]/ Dr. Irene Limbo -[phone].  Left message to call Sharee Pimple, RN at [place], [phone]. Left detailed message requesting thromboprophylaxis around surgery.

## 2021-10-13 NOTE — Telephone Encounter (Signed)
Beth from [facility] returned call, left detailed message.   Dr. Irene Limbo has only seen patient 1 time, 06/18/19. No follow-up was needed. No hematology f/u needed, PCP to manage. No additional consult.   Routing to Dr. Talbert Nan

## 2021-10-18 ENCOUNTER — Other Ambulatory Visit: Payer: Self-pay | Admitting: *Deleted

## 2021-10-18 DIAGNOSIS — M329 Systemic lupus erythematosus, unspecified: Secondary | ICD-10-CM

## 2021-10-18 NOTE — Telephone Encounter (Signed)
Refill request received via fax  Next Visit: 12/13/2021  Last Visit: 09/09/2021  Last Fill: 08/11/2021  BP:[id number] lupus erythematosus, unspecified SLE type, unspecified organ involvement status   Current Dose per office note 09/09/2021: not discussed (per last prescription sent on 08/11/2021 Inject 200 mg into the skin once a week.)  Labs: 09/09/2021 Labs look fine no change in blood count or renal or liver function since stating benlysta  TB Gold: 07/25/2021 Neg    Okay to refill Benlysta?

## 2021-10-18 NOTE — Telephone Encounter (Signed)
I called patient to get update on the below. Patient said [place] and reports she is not able to get any response from this imaging center. I called the breast center sounds like from the below that the breast center have the films from the previous imaging center, however when I spoke with the breast center they do not have any films from the imaging center. I left message for patient to call to let her know that she can try to fill out a medical release form from [facility] (the breast center) and they can try to obtain films.

## 2021-10-19 MED ORDER — BENLYSTA 200 MG/ML ~~LOC~~ SOAJ: 200.0000 mg | SUBCUTANEOUS | 0 refills | Status: DC

## 2021-10-21 ENCOUNTER — Other Ambulatory Visit: Payer: Self-pay | Admitting: Sports Medicine

## 2021-10-21 DIAGNOSIS — M545 Low back pain, unspecified: Secondary | ICD-10-CM

## 2021-10-21 NOTE — Telephone Encounter (Signed)
Disregard

## 2021-10-24 ENCOUNTER — Other Ambulatory Visit: Payer: Self-pay

## 2021-10-24 ENCOUNTER — Encounter (HOSPITAL_BASED_OUTPATIENT_CLINIC_OR_DEPARTMENT_OTHER): Payer: Self-pay | Admitting: Obstetrics and Gynecology

## 2021-10-24 ENCOUNTER — Telehealth: Payer: Self-pay | Admitting: Internal Medicine

## 2021-10-24 DIAGNOSIS — R233 Spontaneous ecchymoses: Secondary | ICD-10-CM

## 2021-10-24 HISTORY — DX: Spontaneous ecchymoses: R23.3

## 2021-10-24 NOTE — Progress Notes (Signed)
Spoke w/ via phone for pre-op interview---pt Lab needs dos----  urine preg poct Lab appt 10-26-2021 1500 pm for cbc cmp t & s COVID test -----patient states asymptomatic no test needed Arrive at -------530 am 10-31-2021 No solid food after midnight, may have clear liquids from midnight until 430 am Med rec completed Medications to take morning of surgery -----prednisone, hydroxyazine prn Diabetic medication -----n/a Patient instructed no nail polish to be worn day of surgery Patient instructed to bring photo id and insurance card day of surgery Patient aware to have Driver (ride ) / caregiver    for 24 hours after surgery  sign other tony burris Patient Special Instructions -----pt given overnight stay instructions Pre-Op special Istructions -----none Patient verbalized understanding of instructions that were given at this phone interview. Patient denies shortness of breath, chest pain, fever, cough at this phone interview.   Anesthesia  PCP: dr Jacqulyn Bath sagardia medical clearance and post op recommendations note dated 10-06-2021 chart/epic Cardiologist :none Chest x-ray :04-11-2021 1 view epic EKG : 04-11-2021 chart/epic [facility] rheumatology dr Benjamine Mola 09-09-2021 benlystda held since 10-12-2021 per dr rice recommendations for 10-31-2021 surgery per pt Neurology dr y Krista Blue 10-12-2020 epic f/u prn Mri brain 06-05-2019 epic Ct head 04-11-2021 epic Echo :none Stress test:none Cardiac Cath : none Activity level: does own housework and can climb flight of staits without difficulty Sleep Study/ CPAP :none

## 2021-10-24 NOTE — Telephone Encounter (Signed)
She will probably need to just resume the 10 mg dose for now in that case. We can send new Rx as needed since hre current will probably run out before follow up visit if increasing again. Hopefully we will see some more improvement by next follow up visit with the [facility], it can be slow in working hopefully she gets it in a timely fashion.

## 2021-10-24 NOTE — Progress Notes (Signed)
Your procedure is scheduled on 10-31-2021  Report to [facility]   Call this number if you have problems the morning of surgery  :[phone].   OUR ADDRESS IS [place].  WE ARE LOCATED IN [facility].  PLEASE BRING YOUR INSURANCE CARD AND PHOTO ID DAY OF SURGERY.  ONLY ONE PERSON ALLOWED IN FACILITY WAITING AREA.                                     REMEMBER:  DO NOT EAT FOOD, CANDY GUM OR MINTS  AFTER MIDNIGHT . YOU MAY HAVE CLEAR LIQUIDS FROM MIDNIGHT UNTIL 430 AM. NO CLEAR LIQUIDS AFTER  430 AM DAY OF SURGERY.   YOU MAY  BRUSH YOUR TEETH MORNING OF SURGERY AND RINSE YOUR MOUTH OUT, NO CHEWING GUM CANDY OR MINTS.    CLEAR LIQUID DIET   Foods Allowed                                                                     Foods Excluded  Coffee and tea, regular and decaf                             liquids that you cannot  Plain Jell-O any favor except red or purple                                           see through such as: Fruit ices (not with fruit pulp)                                     milk, soups, orange juice  Iced Popsicles                                    All solid food Carbonated beverages, regular and diet                                    Cranberry, grape and apple juices Sports drinks like Gatorade Lightly seasoned clear broth or consume(fat free) Sugar  Sample Menu Breakfast                                Lunch                                     Supper Cranberry juice  Jell-O                                     Grape juice                           Apple juice Coffee or tea                        Jell-O                                      Popsicle                                                Coffee or tea                        Coffee or tea  _____________________________________________________________________     TAKE THESE MEDICATIONS  MORNING OF SURGERY WITH A SIP OF WATER:  PREDNISONE, HYDROXYAZINE (ATARAX.) IF NEEDED  ONE VISITOR IS ALLOWED IN WAITING ROOM ONLY DAY OF SURGERY.  YOU MAY HAVE ANOTHER PERSON SWITCH OUT WITH THE  1  VISITOR IN THE WAITING ROOM DAY OF SURGERY AND A MASK MUST BE WORN IN THE WAITING ROOM.    2 VISITORS  MAY VISIT IN THE EXTENDED RECOVERY ROOM UNTIL 800 PM ONLY 1 VISITOR AGE 85 AND OVER MAY SPEND THE NIGHT AND MUST BE IN EXTENDED RECOVERY ROOM NO LATER THAN 800 PM .   UP TO 2 CHILDREN AGE 38 TO 15 MAY ALSO VISIT IN EXTENDED RECOVERY ROOM ONLY UNTIL 800 PM AND MUST LEAVE BY 800 PM. ALL PERSONS VISITING IN EXTENDED RECOVERY ROOM MUST WEAR A MASK.                                    DO NOT WEAR JEWERLY, MAKE UP. DO NOT WEAR LOTIONS, POWDERS, PERFUMES OR NAIL POLISH ON YOUR FINGERNAILS. TOENAIL POLISH IS OK TO WEAR. DO NOT SHAVE FOR 48 HOURS PRIOR TO DAY OF SURGERY. MEN MAY SHAVE FACE AND NECK. CONTACTS, GLASSES, OR DENTURES MAY NOT BE WORN TO SURGERY.                                    [facility] IS NOT RESPONSIBLE  FOR ANY BELONGINGS.                                                                    Marland Kitchen           [facility] - Preparing for Surgery Before surgery, you can play an important role.  Because skin is not sterile, your skin needs to be as free of germs as possible.  You can reduce the number of germs  on your skin by washing with CHG (chlorahexidine gluconate) soap before surgery.  CHG is an antiseptic cleaner which kills germs and bonds with the skin to continue killing germs even after washing. Please DO NOT use if you have an allergy to CHG or antibacterial soaps.  If your skin becomes reddened/irritated stop using the CHG and inform your nurse when you arrive at Short Stay. Do not shave (including legs and underarms) for at least 48 hours prior to the first CHG shower.  You may shave your face/neck. Please follow these instructions carefully:  1.  Shower with CHG Soap the night before  surgery and the  morning of Surgery.  2.  If you choose to wash your hair, wash your hair first as usual with your  normal  shampoo.  3.  After you shampoo, rinse your hair and body thoroughly to remove the  shampoo.                            4.  Use CHG as you would any other liquid soap.  You can apply chg directly  to the skin and wash                      Gently with a scrungie or clean washcloth.  5.  Apply the CHG Soap to your body ONLY FROM THE NECK DOWN.   Do not use on face/ open                           Wound or open sores. Avoid contact with eyes, ears mouth and genitals (private parts).                       Wash face,  Genitals (private parts) with your normal soap.             6.  Wash thoroughly, paying special attention to the area where your surgery  will be performed.  7.  Thoroughly rinse your body with warm water from the neck down.  8.  DO NOT shower/wash with your normal soap after using and rinsing off  the CHG Soap.                9.  Pat yourself dry with a clean towel.            10.  Wear clean pajamas.            11.  Place clean sheets on your bed the night of your first shower and do not  sleep with pets. Day of Surgery : Do not apply any lotions/deodorants the morning of surgery.  Please wear clean clothes to the hospital/surgery center.  IF YOU HAVE ANY SKIN IRRITATION OR PROBLEMS WITH THE SURGICAL SOAP, PLEASE GET A BAR OF GOLD DIAL SOAP AND SHOWER THE NIGHT BEFORE YOUR SURGERY AND THE MORNING OF YOUR SURGERY. PLEASE LET THE NURSE KNOW MORNING OF YOUR SURGERY IF YOU HAD ANY PROBLEMS WITH THE SURGICAL SOAP.  FAILURE TO FOLLOW THESE INSTRUCTIONS MAY RESULT IN THE CANCELLATION OF YOUR SURGERY PATIENT SIGNATURE_________________________________  NURSE SIGNATURE__________________________________  ________________________________________________________________________  QUESTIONS CALL Drake Wuertz PRE OP NURSE PHONE  [phone].

## 2021-10-24 NOTE — Telephone Encounter (Signed)
Spoke with patient. Surgery date request confirmed.  Advised surgery is scheduled for 10/31/21, [facility] at [facility]. Surgery instruction sheet and hospital brochure reviewed, printed copy will be mailed. Patient advised if Covid screening and quarantine requirements and agreeable.   Routing to provider for final review and any additional recommendations.   Cc: Hayley Carder

## 2021-10-24 NOTE — Telephone Encounter (Signed)
#  1. Patient states 5 mg Prednisone not helping. Patient is unable to break pill in half to go to 7.5 mg dose. Please advise. Patient also requests refill on Benlysta to be sent into mail order pharmacy. Please cal lto advise.

## 2021-10-25 NOTE — Telephone Encounter (Signed)
Patient advised per Dr. Benjamine Mola she will probably need to just resume the 10 mg dose for now in that case. We can send new Rx as needed since hre current will probably run out before follow up visit if increasing again. Hopefully we will see some more improvement by next follow up visit with the [facility], it can be slow in working hopefully she gets it in a timely fashion. Patient advised Gwinda Maine was sent to the pharmacy on 10/19/2021 so she may contact them to set up shipment. Patient advised once she is in need of a refill of prednisone to have pharmacy send a refill request. Patient expressed understanding.

## 2021-10-26 ENCOUNTER — Other Ambulatory Visit: Payer: Self-pay

## 2021-10-26 ENCOUNTER — Encounter (HOSPITAL_COMMUNITY)
Admission: RE | Admit: 2021-10-26 | Discharge: 2021-10-26 | Disposition: A | Discharge status: Home / Self Care | Payer: 59 | Source: Ambulatory Visit | Attending: Obstetrics and Gynecology | Admitting: Obstetrics and Gynecology

## 2021-10-26 DIAGNOSIS — Z01818 Encounter for other preprocedural examination: Secondary | ICD-10-CM

## 2021-10-26 DIAGNOSIS — Z01812 Encounter for preprocedural laboratory examination: Secondary | ICD-10-CM | POA: Diagnosis not present

## 2021-10-26 LAB — COMPREHENSIVE METABOLIC PANEL
ALT: 23 U/L (ref 0–44)
AST: 25 U/L (ref 15–41)
Albumin: 4.9 g/dL (ref 3.5–5.0)
Alkaline Phosphatase: 47 U/L (ref 38–126)
Anion gap: 6 (ref 5–15)
BUN: 18 mg/dL (ref 6–20)
CO2: 25 mmol/L (ref 22–32)
Calcium: 9.3 mg/dL (ref 8.9–10.3)
Chloride: 104 mmol/L (ref 98–111)
Creatinine, Ser: 0.9 mg/dL (ref 0.44–1.00)
GFR, Estimated: >60 mL/min (ref >60)
Glucose, Bld: 119 mg/dL — ABNORMAL HIGH (ref 70–99)
Potassium: 4.5 mmol/L (ref 3.5–5.1)
Sodium: 135 mmol/L (ref 135–145)
Total Bilirubin: 0.4 mg/dL (ref 0.3–1.2)
Total Protein: 7.6 g/dL (ref 6.5–8.1)

## 2021-10-26 LAB — CBC
HCT: 42.3 % (ref 36.0–46.0)
Hemoglobin: 14.1 g/dL (ref 12.0–15.0)
MCH: 31.1 pg (ref 26.0–34.0)
MCHC: 33.3 g/dL (ref 30.0–36.0)
MCV: 93.4 fL (ref 80.0–100.0)
Platelets: 314 10*3/uL (ref 150–400)
RBC: 4.53 MIL/uL (ref 3.87–5.11)
RDW: 12.5 % (ref 11.5–15.5)
WBC: 7.8 10*3/uL (ref 4.0–10.5)
nRBC: 0 % (ref 0.0–0.2)

## 2021-10-27 ENCOUNTER — Encounter (HOSPITAL_COMMUNITY): Admission: RE | Admit: 2021-10-27 | Payer: 59 | Source: Ambulatory Visit

## 2021-10-30 NOTE — Anesthesia Preprocedure Evaluation (Addendum)
Anesthesia Evaluation  Patient identified by MRN, date of birth, ID band Patient awake  General Assessment Comment:Hypotension  with 10-15-2020 endoscopy   Reviewed: Allergy & Precautions, H&P , NPO status , Patient's Chart, lab work & pertinent test results  History of Anesthesia Complications (+) PONV and history of anesthetic complications  Airway Mallampati: I  TM Distance: >3 FB Neck ROM: Full    Dental no notable dental hx. (+) Teeth Intact, Dental Advisory Given   Pulmonary neg pulmonary ROS,    Pulmonary exam normal breath sounds clear to auscultation       Cardiovascular Exercise Tolerance: Good + DVT  Normal cardiovascular exam+ Valvular Problems/Murmurs  Rhythm:Regular Rate:Normal     Neuro/Psych  Headaches, TIA Neuromuscular disease negative psych ROS   GI/Hepatic Neg liver ROS, hiatal hernia, GERD  Medicated,  Endo/Other  negative endocrine ROS  Renal/GU negative Renal ROS  negative genitourinary   Musculoskeletal  (+) Fibromyalgia -  Abdominal   Peds negative pediatric ROS (+)  Hematology negative hematology ROS (+)   Anesthesia Other Findings Systemic lupus erythematosus   Reproductive/Obstetrics negative OB ROS                            Anesthesia Physical Anesthesia Plan  ASA: 2  Anesthesia Plan: General   Post-op Pain Management: Tylenol PO (pre-op) and Toradol IV (intra-op)   Induction: Intravenous  PONV Risk Score and Plan: 3 and Ondansetron, Dexamethasone, Midazolam, Treatment may vary due to age or medical condition and Scopolamine patch - Pre-op  Airway Management Planned: Oral ETT  Additional Equipment: None  Intra-op Plan:   Post-operative Plan: Extubation in OR  Informed Consent: I have reviewed the patients History and Physical, chart, labs and discussed the procedure including the risks, benefits and alternatives for the proposed anesthesia with  the patient or authorized representative who has indicated his/her understanding and acceptance.       Plan Discussed with: Anesthesiologist and CRNA  Anesthesia Plan Comments: (  )       Anesthesia Quick Evaluation

## 2021-10-31 ENCOUNTER — Ambulatory Visit (HOSPITAL_BASED_OUTPATIENT_CLINIC_OR_DEPARTMENT_OTHER)
Admission: RE | Admit: 2021-10-31 | Discharge: 2021-10-31 | Disposition: A | Discharge status: Home / Self Care | Payer: 59 | Attending: Obstetrics and Gynecology | Admitting: Obstetrics and Gynecology

## 2021-10-31 ENCOUNTER — Encounter (HOSPITAL_BASED_OUTPATIENT_CLINIC_OR_DEPARTMENT_OTHER): Payer: Self-pay | Admitting: Obstetrics and Gynecology

## 2021-10-31 ENCOUNTER — Ambulatory Visit (HOSPITAL_BASED_OUTPATIENT_CLINIC_OR_DEPARTMENT_OTHER): Payer: 59 | Admitting: Anesthesiology

## 2021-10-31 ENCOUNTER — Encounter (HOSPITAL_BASED_OUTPATIENT_CLINIC_OR_DEPARTMENT_OTHER)
Admission: RE | Disposition: A | Discharge status: Home / Self Care | Payer: Self-pay | Source: Home / Self Care | Attending: Obstetrics and Gynecology

## 2021-10-31 ENCOUNTER — Other Ambulatory Visit: Payer: Self-pay

## 2021-10-31 DIAGNOSIS — Z8673 Personal history of transient ischemic attack (TIA), and cerebral infarction without residual deficits: Secondary | ICD-10-CM | POA: Diagnosis not present

## 2021-10-31 DIAGNOSIS — Q2112 Patent foramen ovale: Secondary | ICD-10-CM | POA: Insufficient documentation

## 2021-10-31 DIAGNOSIS — Z8739 Personal history of other diseases of the musculoskeletal system and connective tissue: Secondary | ICD-10-CM | POA: Insufficient documentation

## 2021-10-31 DIAGNOSIS — N888 Other specified noninflammatory disorders of cervix uteri: Secondary | ICD-10-CM | POA: Insufficient documentation

## 2021-10-31 DIAGNOSIS — Z8742 Personal history of other diseases of the female genital tract: Secondary | ICD-10-CM | POA: Diagnosis not present

## 2021-10-31 DIAGNOSIS — Z86718 Personal history of other venous thrombosis and embolism: Secondary | ICD-10-CM | POA: Insufficient documentation

## 2021-10-31 DIAGNOSIS — Z01818 Encounter for other preprocedural examination: Secondary | ICD-10-CM

## 2021-10-31 DIAGNOSIS — N946 Dysmenorrhea, unspecified: Secondary | ICD-10-CM | POA: Insufficient documentation

## 2021-10-31 DIAGNOSIS — N8003 Adenomyosis of the uterus: Secondary | ICD-10-CM | POA: Insufficient documentation

## 2021-10-31 DIAGNOSIS — Z90710 Acquired absence of both cervix and uterus: Secondary | ICD-10-CM | POA: Diagnosis present

## 2021-10-31 DIAGNOSIS — N939 Abnormal uterine and vaginal bleeding, unspecified: Secondary | ICD-10-CM | POA: Diagnosis present

## 2021-10-31 HISTORY — PX: CYSTOSCOPY: SHX5120

## 2021-10-31 HISTORY — DX: Fibromyalgia: M79.7

## 2021-10-31 HISTORY — DX: Systemic lupus erythematosus, unspecified: M32.9

## 2021-10-31 HISTORY — DX: Personal history of other diseases of the digestive system: Z87.19

## 2021-10-31 HISTORY — DX: Viral meningitis, unspecified: A87.9

## 2021-10-31 HISTORY — PX: TOTAL LAPAROSCOPIC HYSTERECTOMY WITH SALPINGECTOMY: SHX6742

## 2021-10-31 LAB — CBC
HCT: 39 % (ref 36.0–46.0)
Hemoglobin: 13 g/dL (ref 12.0–15.0)
MCH: 30.3 pg (ref 26.0–34.0)
MCHC: 33.3 g/dL (ref 30.0–36.0)
MCV: 90.9 fL (ref 80.0–100.0)
Platelets: 257 10*3/uL (ref 150–400)
RBC: 4.29 MIL/uL (ref 3.87–5.11)
RDW: 12.4 % (ref 11.5–15.5)
WBC: 11.7 10*3/uL — ABNORMAL HIGH (ref 4.0–10.5)
nRBC: 0 % (ref 0.0–0.2)

## 2021-10-31 LAB — TYPE AND SCREEN
ABO/RH(D): O POS
Antibody Screen: NEGATIVE

## 2021-10-31 LAB — ABO/RH: ABO/RH(D): O POS

## 2021-10-31 LAB — POCT PREGNANCY, URINE: Preg Test, Ur: NEGATIVE

## 2021-10-31 SURGERY — HYSTERECTOMY, TOTAL, LAPAROSCOPIC, WITH SALPINGECTOMY
Anesthesia: General | Site: Uterus | Laterality: Bilateral

## 2021-10-31 MED ORDER — LACTATED RINGERS IV SOLN
INTRAVENOUS | Status: DC
  Administered 2021-10-31: 1000 mL via INTRAVENOUS

## 2021-10-31 MED ORDER — OXYCODONE HCL 5 MG PO TABS
5.0000 mg | ORAL_TABLET | ORAL | Status: DC | PRN
  Administered 2021-10-31 (×2): 10 mg via ORAL

## 2021-10-31 MED ORDER — DOCUSATE SODIUM 100 MG PO CAPS
100.0000 mg | ORAL_CAPSULE | Freq: Two times a day (BID) | ORAL | Status: DC
  Administered 2021-10-31: 100 mg via ORAL

## 2021-10-31 MED ORDER — METOPROLOL TARTRATE 5 MG/5ML IV SOLN
INTRAVENOUS | Status: AC
  Filled 2021-10-31: qty 5

## 2021-10-31 MED ORDER — PROPOFOL 500 MG/50ML IV EMUL
INTRAVENOUS | Status: AC
  Filled 2021-10-31: qty 50

## 2021-10-31 MED ORDER — MENTHOL 3 MG MT LOZG: 1.0000 | LOZENGE | OROMUCOSAL | Status: DC | PRN

## 2021-10-31 MED ORDER — ONDANSETRON HCL 4 MG/2ML IJ SOLN: 4.0000 mg | Freq: Four times a day (QID) | INTRAMUSCULAR | Status: DC | PRN

## 2021-10-31 MED ORDER — POVIDONE-IODINE 10 % EX SWAB: 2.0000 "application " | Freq: Once | CUTANEOUS | Status: DC

## 2021-10-31 MED ORDER — PANTOPRAZOLE SODIUM 40 MG PO TBEC
40.0000 mg | DELAYED_RELEASE_TABLET | Freq: Every day | ORAL | Status: DC
  Administered 2021-10-31: 40 mg via ORAL

## 2021-10-31 MED ORDER — PROPOFOL 10 MG/ML IV BOLUS
INTRAVENOUS | Status: DC | PRN
  Administered 2021-10-31: 160 mg via INTRAVENOUS

## 2021-10-31 MED ORDER — ROPIVACAINE HCL 5 MG/ML IJ SOLN
INTRAMUSCULAR | Status: DC | PRN
  Administered 2021-10-31: 30 mL

## 2021-10-31 MED ORDER — LIDOCAINE 2% (20 MG/ML) 5 ML SYRINGE
INTRAMUSCULAR | Status: DC | PRN
  Administered 2021-10-31: 80 mg via INTRAVENOUS

## 2021-10-31 MED ORDER — OXYCODONE HCL 5 MG/5ML PO SOLN: 5.0000 mg | Freq: Once | ORAL | Status: DC | PRN

## 2021-10-31 MED ORDER — PANTOPRAZOLE SODIUM 40 MG PO TBEC
DELAYED_RELEASE_TABLET | ORAL | Status: AC
  Filled 2021-10-31: qty 1

## 2021-10-31 MED ORDER — ACETAMINOPHEN 500 MG PO TABS
1000.0000 mg | ORAL_TABLET | Freq: Four times a day (QID) | ORAL | Status: DC
  Administered 2021-10-31: 1000 mg via ORAL

## 2021-10-31 MED ORDER — ACETAMINOPHEN 325 MG PO TABS: 325.0000 mg | ORAL_TABLET | ORAL | Status: DC | PRN

## 2021-10-31 MED ORDER — MEPERIDINE HCL 25 MG/ML IJ SOLN: 6.2500 mg | INTRAMUSCULAR | Status: DC | PRN

## 2021-10-31 MED ORDER — MIDAZOLAM HCL 2 MG/2ML IJ SOLN
INTRAMUSCULAR | Status: DC | PRN
  Administered 2021-10-31: 2 mg via INTRAVENOUS

## 2021-10-31 MED ORDER — DEXAMETHASONE SODIUM PHOSPHATE 10 MG/ML IJ SOLN
INTRAMUSCULAR | Status: AC
  Filled 2021-10-31: qty 1

## 2021-10-31 MED ORDER — HYDROMORPHONE HCL 1 MG/ML IJ SOLN
0.2000 mg | INTRAMUSCULAR | Status: DC | PRN
  Administered 2021-10-31: 0.5 mg via INTRAVENOUS

## 2021-10-31 MED ORDER — DIPHENHYDRAMINE HCL 50 MG/ML IJ SOLN
INTRAMUSCULAR | Status: DC | PRN
  Administered 2021-10-31: 12.5 mg via INTRAVENOUS

## 2021-10-31 MED ORDER — GENTAMICIN SULFATE 40 MG/ML IJ SOLN
340.0000 mg | INTRAVENOUS | Status: AC
  Administered 2021-10-31: 340 mg via INTRAVENOUS
  Filled 2021-10-31: qty 8.5

## 2021-10-31 MED ORDER — ENOXAPARIN SODIUM 40 MG/0.4ML IJ SOSY
40.0000 mg | PREFILLED_SYRINGE | INTRAMUSCULAR | Status: AC
  Administered 2021-10-31: 40 mg via SUBCUTANEOUS

## 2021-10-31 MED ORDER — KETOROLAC TROMETHAMINE 30 MG/ML IJ SOLN
INTRAMUSCULAR | Status: DC | PRN
  Administered 2021-10-31: 30 mg via INTRAVENOUS

## 2021-10-31 MED ORDER — MIDAZOLAM HCL 2 MG/2ML IJ SOLN
INTRAMUSCULAR | Status: AC
  Filled 2021-10-31: qty 2

## 2021-10-31 MED ORDER — OXYCODONE HCL 5 MG PO TABS: 5.0000 mg | ORAL_TABLET | Freq: Once | ORAL | Status: DC | PRN

## 2021-10-31 MED ORDER — SODIUM CHLORIDE 0.9 % IR SOLN
Status: DC | PRN
  Administered 2021-10-31: 3000 mL

## 2021-10-31 MED ORDER — CLINDAMYCIN PHOSPHATE 900 MG/50ML IV SOLN
900.0000 mg | INTRAVENOUS | Status: AC
  Administered 2021-10-31: 900 mg via INTRAVENOUS

## 2021-10-31 MED ORDER — ACETAMINOPHEN 500 MG PO TABS
1000.0000 mg | ORAL_TABLET | ORAL | Status: AC
  Administered 2021-10-31: 1000 mg via ORAL

## 2021-10-31 MED ORDER — FENTANYL CITRATE (PF) 100 MCG/2ML IJ SOLN
INTRAMUSCULAR | Status: DC | PRN
  Administered 2021-10-31 (×2): 50 ug via INTRAVENOUS
  Administered 2021-10-31: 100 ug via INTRAVENOUS
  Administered 2021-10-31 (×2): 50 ug via INTRAVENOUS

## 2021-10-31 MED ORDER — ONDANSETRON HCL 4 MG/2ML IJ SOLN: 4.0000 mg | Freq: Once | INTRAMUSCULAR | Status: DC | PRN

## 2021-10-31 MED ORDER — FENTANYL CITRATE (PF) 100 MCG/2ML IJ SOLN
INTRAMUSCULAR | Status: AC
  Filled 2021-10-31: qty 2

## 2021-10-31 MED ORDER — ENOXAPARIN SODIUM 40 MG/0.4ML IJ SOSY: 40.0000 mg | PREFILLED_SYRINGE | INTRAMUSCULAR | Status: DC

## 2021-10-31 MED ORDER — LACTATED RINGERS IV SOLN: INTRAVENOUS | Status: DC

## 2021-10-31 MED ORDER — GABAPENTIN 300 MG PO CAPS
300.0000 mg | ORAL_CAPSULE | ORAL | Status: AC
  Administered 2021-10-31: 300 mg via ORAL

## 2021-10-31 MED ORDER — CLINDAMYCIN PHOSPHATE 900 MG/50ML IV SOLN
INTRAVENOUS | Status: AC
  Filled 2021-10-31: qty 50

## 2021-10-31 MED ORDER — ONDANSETRON HCL 4 MG/2ML IJ SOLN
INTRAMUSCULAR | Status: AC
  Filled 2021-10-31: qty 2

## 2021-10-31 MED ORDER — DOCUSATE SODIUM 100 MG PO CAPS
ORAL_CAPSULE | ORAL | Status: AC
  Filled 2021-10-31: qty 1

## 2021-10-31 MED ORDER — ACETAMINOPHEN 160 MG/5ML PO SOLN: 325.0000 mg | ORAL | Status: DC | PRN

## 2021-10-31 MED ORDER — SCOPOLAMINE 1 MG/3DAYS TD PT72
1.0000 | MEDICATED_PATCH | TRANSDERMAL | Status: DC
  Administered 2021-10-31: 1.5 mg via TRANSDERMAL

## 2021-10-31 MED ORDER — ROCURONIUM BROMIDE 10 MG/ML (PF) SYRINGE
PREFILLED_SYRINGE | INTRAVENOUS | Status: AC
  Filled 2021-10-31: qty 10

## 2021-10-31 MED ORDER — SODIUM CHLORIDE (PF) 0.9 % IJ SOLN
INTRAMUSCULAR | Status: DC | PRN
  Administered 2021-10-31: 30 mL

## 2021-10-31 MED ORDER — ACETAMINOPHEN 500 MG PO TABS
ORAL_TABLET | ORAL | Status: AC
  Filled 2021-10-31: qty 2

## 2021-10-31 MED ORDER — ENOXAPARIN SODIUM 40 MG/0.4ML IJ SOSY
PREFILLED_SYRINGE | INTRAMUSCULAR | Status: AC
  Filled 2021-10-31: qty 0.4

## 2021-10-31 MED ORDER — OXYCODONE HCL 5 MG PO TABS: 5.0000 mg | ORAL_TABLET | ORAL | 0 refills | Status: DC | PRN

## 2021-10-31 MED ORDER — HYDROMORPHONE HCL 1 MG/ML IJ SOLN
INTRAMUSCULAR | Status: AC
  Filled 2021-10-31: qty 1

## 2021-10-31 MED ORDER — GABAPENTIN 300 MG PO CAPS
ORAL_CAPSULE | ORAL | Status: AC
  Filled 2021-10-31: qty 1

## 2021-10-31 MED ORDER — IBUPROFEN 200 MG PO TABS: 600.0000 mg | ORAL_TABLET | Freq: Four times a day (QID) | ORAL | Status: DC

## 2021-10-31 MED ORDER — SUGAMMADEX SODIUM 200 MG/2ML IV SOLN
INTRAVENOUS | Status: DC | PRN
  Administered 2021-10-31: 200 mg via INTRAVENOUS

## 2021-10-31 MED ORDER — SIMETHICONE 80 MG PO CHEW: 80.0000 mg | CHEWABLE_TABLET | Freq: Four times a day (QID) | ORAL | Status: DC | PRN

## 2021-10-31 MED ORDER — FENTANYL CITRATE (PF) 100 MCG/2ML IJ SOLN: 25.0000 ug | INTRAMUSCULAR | Status: DC | PRN

## 2021-10-31 MED ORDER — DEXAMETHASONE SODIUM PHOSPHATE 10 MG/ML IJ SOLN
INTRAMUSCULAR | Status: DC | PRN
  Administered 2021-10-31: 10 mg via INTRAVENOUS

## 2021-10-31 MED ORDER — HYDROXYZINE HCL 25 MG PO TABS: 25.0000 mg | ORAL_TABLET | Freq: Every evening | ORAL | Status: DC | PRN

## 2021-10-31 MED ORDER — SCOPOLAMINE 1 MG/3DAYS TD PT72
MEDICATED_PATCH | TRANSDERMAL | Status: AC
  Filled 2021-10-31: qty 1

## 2021-10-31 MED ORDER — KETOROLAC TROMETHAMINE 30 MG/ML IJ SOLN
30.0000 mg | Freq: Four times a day (QID) | INTRAMUSCULAR | Status: DC
  Administered 2021-10-31: 30 mg via INTRAVENOUS

## 2021-10-31 MED ORDER — HYDROXYCHLOROQUINE SULFATE 200 MG PO TABS: 400.0000 mg | ORAL_TABLET | Freq: Every day | ORAL | Status: DC

## 2021-10-31 MED ORDER — ONDANSETRON HCL 4 MG/2ML IJ SOLN
INTRAMUSCULAR | Status: DC | PRN
  Administered 2021-10-31: 4 mg via INTRAVENOUS

## 2021-10-31 MED ORDER — KCL IN DEXTROSE-NACL 20-5-0.2 MEQ/L-%-% IV SOLN
INTRAVENOUS | Status: DC
  Filled 2021-10-31: qty 1000

## 2021-10-31 MED ORDER — OXYCODONE HCL 5 MG PO TABS
ORAL_TABLET | ORAL | Status: AC
  Filled 2021-10-31: qty 2

## 2021-10-31 MED ORDER — CYCLOBENZAPRINE HCL 10 MG PO TABS: 10.0000 mg | ORAL_TABLET | Freq: Three times a day (TID) | ORAL | Status: DC | PRN

## 2021-10-31 MED ORDER — ROCURONIUM BROMIDE 10 MG/ML (PF) SYRINGE
PREFILLED_SYRINGE | INTRAVENOUS | Status: DC | PRN
  Administered 2021-10-31: 60 mg via INTRAVENOUS

## 2021-10-31 MED ORDER — KETOROLAC TROMETHAMINE 30 MG/ML IJ SOLN
INTRAMUSCULAR | Status: AC
  Filled 2021-10-31: qty 1

## 2021-10-31 MED ORDER — KCL IN DEXTROSE-NACL 20-5-0.45 MEQ/L-%-% IV SOLN
INTRAVENOUS | Status: DC
  Filled 2021-10-31: qty 1000

## 2021-10-31 MED ORDER — ACETAMINOPHEN 500 MG PO TABS
1000.0000 mg | ORAL_TABLET | Freq: Four times a day (QID) | ORAL | 0 refills | Status: DC
Start: 2021-10-31 — End: 2021-11-07

## 2021-10-31 MED ORDER — ONDANSETRON HCL 4 MG PO TABS: 4.0000 mg | ORAL_TABLET | Freq: Four times a day (QID) | ORAL | Status: DC | PRN

## 2021-10-31 MED ORDER — DOCUSATE SODIUM 100 MG PO CAPS: 100.0000 mg | ORAL_CAPSULE | Freq: Two times a day (BID) | ORAL | 0 refills | Status: DC

## 2021-10-31 MED ORDER — PREDNISONE 10 MG PO TABS: 10.0000 mg | ORAL_TABLET | Freq: Every day | ORAL | Status: DC

## 2021-10-31 MED ORDER — BUPIVACAINE HCL (PF) 0.25 % IJ SOLN
INTRAMUSCULAR | Status: DC | PRN
  Administered 2021-10-31: 13 mL

## 2021-10-31 MED ORDER — IBUPROFEN 800 MG PO TABS: 800.0000 mg | ORAL_TABLET | Freq: Three times a day (TID) | ORAL | 0 refills | Status: DC | PRN

## 2021-10-31 MED ORDER — LIDOCAINE 2% (20 MG/ML) 5 ML SYRINGE
INTRAMUSCULAR | Status: AC
  Filled 2021-10-31: qty 5

## 2021-10-31 SURGICAL SUPPLY — 73 items
ADH SKN CLS APL DERMABOND .7 (GAUZE/BANDAGES/DRESSINGS) ×2
APL ESCP 73.6OZ SRGCL (TIP)
APL SRG 38 LTWT LNG FL B (MISCELLANEOUS)
APPLICATOR ARISTA FLEXITIP XL (MISCELLANEOUS) IMPLANT
BLADE SURG 10 STRL SS (BLADE) IMPLANT
CABLE HIGH FREQUENCY MONO STRZ (ELECTRODE) IMPLANT
CELL SAVER LIPIGURD (MISCELLANEOUS) IMPLANT
CNTNR URN SCR LID CUP LEK RST (MISCELLANEOUS) ×2 IMPLANT
CONT SPEC 4OZ STRL OR WHT (MISCELLANEOUS) ×3
COVER BACK TABLE 60X90IN (DRAPES) IMPLANT
COVER MAYO STAND STRL (DRAPES) ×3 IMPLANT
DECANTER SPIKE VIAL GLASS SM (MISCELLANEOUS) ×3 IMPLANT
DERMABOND ADVANCED (GAUZE/BANDAGES/DRESSINGS) ×1
DERMABOND ADVANCED .7 DNX12 (GAUZE/BANDAGES/DRESSINGS) ×2 IMPLANT
DRSG COVADERM PLUS 2X2 (GAUZE/BANDAGES/DRESSINGS) ×9 IMPLANT
DRSG OPSITE POSTOP 3X4 (GAUZE/BANDAGES/DRESSINGS) ×3 IMPLANT
DURAPREP 26ML APPLICATOR (WOUND CARE) ×3 IMPLANT
EXTRT SYSTEM ALEXIS 14CM (MISCELLANEOUS)
EXTRT SYSTEM ALEXIS 17CM (MISCELLANEOUS)
GAUZE 4X4 16PLY ~~LOC~~+RFID DBL (SPONGE) ×6 IMPLANT
GLOVE SURG ENC MOIS LTX SZ6.5 (GLOVE) ×6 IMPLANT
GLOVE SURG UNDER POLY LF SZ7 (GLOVE) ×12 IMPLANT
GLOVE SURG UNDER POLY LF SZ7.5 (GLOVE) ×6 IMPLANT
GOWN STRL REUS W/TWL LRG LVL3 (GOWN DISPOSABLE) ×12 IMPLANT
HARMONIC RUM II 2.5CM SILVER (DISPOSABLE)
HARMONIC RUM II 3.0CM SILVER (DISPOSABLE) ×3
HARMONIC RUM II 3.5CM SILVER (DISPOSABLE)
HARMONIC RUM II 4.0CM SILVER (DISPOSABLE)
HEMOSTAT ARISTA ABSORB 3G PWDR (HEMOSTASIS) IMPLANT
KIT TURNOVER CYSTO (KITS) ×3 IMPLANT
LIGASURE VESSEL 5MM BLUNT TIP (ELECTROSURGICAL) ×3 IMPLANT
NEEDLE INSUFFLATION 120MM (ENDOMECHANICALS) ×3 IMPLANT
OCCLUDER COLPOPNEUMO (BALLOONS) IMPLANT
PACK LAPAROSCOPY BASIN (CUSTOM PROCEDURE TRAY) ×3 IMPLANT
PACK TRENDGUARD 450 HYBRID PRO (MISCELLANEOUS) ×2 IMPLANT
POUCH LAPAROSCOPIC INSTRUMENT (MISCELLANEOUS) ×3 IMPLANT
POWDER SURGICEL 3.0 GRAM (HEMOSTASIS) IMPLANT
PROTECTOR NERVE ULNAR (MISCELLANEOUS) ×6 IMPLANT
RETRACTOR WOUND ALXS 19CM XSML (INSTRUMENTS) IMPLANT
RTRCTR WOUND ALEXIS 19CM XSML (INSTRUMENTS)
SCALPEL HRMNC RUM II 2.5 SILVR (DISPOSABLE) IMPLANT
SCALPEL HRMNC RUM II 3.0 SILVR (DISPOSABLE) ×2 IMPLANT
SCALPEL HRMNC RUM II 3.5 SILVR (DISPOSABLE) IMPLANT
SCALPEL HRMNC RUM II 4.0 SILVR (DISPOSABLE) IMPLANT
SCISSORS LAP 5X35 DISP (ENDOMECHANICALS) IMPLANT
SET IRRIG Y TYPE TUR BLADDER L (SET/KITS/TRAYS/PACK) ×3 IMPLANT
SET SUCTION IRRIG HYDROSURG (IRRIGATION / IRRIGATOR) ×3 IMPLANT
SET TRI-LUMEN FLTR TB AIRSEAL (TUBING) ×3 IMPLANT
SHEARS 1100 HARMONIC 36 (ELECTROSURGICAL) ×3 IMPLANT
SLEEVE SURGEON STRL (DRAPES) ×3 IMPLANT
SUT STRATAFIX PDS+0 CT1 9 (SUTURE) IMPLANT
SUT STRATAFIX SPIRAL PDS+ 0 30 (SUTURE) ×3 IMPLANT
SUT VIC AB 0 CT1 36 (SUTURE) ×3 IMPLANT
SUT VIC AB 4-0 PS2 18 (SUTURE) ×3 IMPLANT
SUT VICRYL 0 UR6 27IN ABS (SUTURE) IMPLANT
SUT VLOC 180 0 9IN  GS21 (SUTURE)
SUT VLOC 180 0 9IN GS21 (SUTURE) IMPLANT
SYR 50ML LL SCALE MARK (SYRINGE) ×6 IMPLANT
SYSTEM CONTND EXTRCTN KII BLLN (MISCELLANEOUS) IMPLANT
TIP ENDOSCOPIC SURGICEL (TIP) IMPLANT
TIP RUMI ORANGE 6.7MMX12CM (TIP) IMPLANT
TIP UTERINE 5.1X6CM LAV DISP (MISCELLANEOUS) ×3 IMPLANT
TIP UTERINE 6.7X10CM GRN DISP (MISCELLANEOUS) IMPLANT
TIP UTERINE 6.7X6CM WHT DISP (MISCELLANEOUS) IMPLANT
TIP UTERINE 6.7X8CM BLUE DISP (MISCELLANEOUS) IMPLANT
TOWEL OR 17X26 10 PK STRL BLUE (TOWEL DISPOSABLE) ×3 IMPLANT
TRAY FOLEY W/BAG SLVR 14FR LF (SET/KITS/TRAYS/PACK) ×3 IMPLANT
TRENDGUARD 450 HYBRID PRO PACK (MISCELLANEOUS) ×3
TROCAR ADV FIXATION 5X100MM (TROCAR) ×3 IMPLANT
TROCAR BLADELESS OPT 5 100 (ENDOMECHANICALS) ×3 IMPLANT
TROCAR PORT AIRSEAL 5X120 (TROCAR) ×3 IMPLANT
TROCAR XCEL NON BLADE 8MM B8LT (ENDOMECHANICALS) ×3 IMPLANT
WARMER LAPAROSCOPE (MISCELLANEOUS) ×3 IMPLANT

## 2021-10-31 NOTE — Discharge Summary (Addendum)
Physician Discharge Summary  Patient ID: Christy Lowe MRN: [id number] DOB/AGE: Dec 17, 1980 40 y.o.  Admit date: 10/31/2021 Discharge date: 10/31/2021  Admission Diagnoses: Severe dysmenorrhea, abnormal uterine bleeding  Discharge Diagnoses:  Principal Problem:   S/P laparoscopic hysterectomy   Discharged Condition: good  Hospital Course: Uncomplicated  Consults: None  Significant Diagnostic Studies: labs:  Lab Results  Component Value Date   WBC 11.7 (H) 10/31/2021   HGB 13.0 10/31/2021   HCT 39.0 10/31/2021   MCV 90.9 10/31/2021   PLT 257 10/31/2021     Treatments: surgery: total laparoscopic hysterectomy, bilateral salpingectomies, cystoscopy  Discharge Exam: Blood pressure 132/71, pulse 77, temperature 98.5 F (36.9 C), resp. rate 17, height 5\' 7"  (1.702 m), weight 84.9 kg, last menstrual period 10/10/2021, SpO2 97 %. General appearance: alert, cooperative, and no distress Resp: clear to auscultation bilaterally Cardio: S1, S2 normal GI: soft, non-tender; bowel sounds normal; no masses,  no organomegaly and incisions are clean, dry and intact without erythema Extremities: Homans sign is negative, no sign of DVT  Disposition: Discharge disposition: 01-Home or Self Care       Discharge Instructions     Call MD for:   Complete by: As directed    Heavy vaginal bleeding   Call MD for:  difficulty breathing, headache or visual disturbances   Complete by: As directed    Call MD for:  hives   Complete by: As directed    Call MD for:  persistant dizziness or light-headedness   Complete by: As directed    Call MD for:  persistant nausea and vomiting   Complete by: As directed    Call MD for:  redness, tenderness, or signs of infection (pain, swelling, redness, odor or green/yellow discharge around incision site)   Complete by: As directed    Call MD for:  severe uncontrolled pain   Complete by: As directed    Call MD for:  temperature >100.4   Complete by:  As directed    Diet general   Complete by: As directed    Driving Restrictions   Complete by: As directed    No driving until you are off of oxycodone   Increase activity slowly   Complete by: As directed    May shower / Bathe   Complete by: As directed    Can shower 24 hours after surgery, no tub bath   No dressing needed   Complete by: As directed    Sexual Activity Restrictions   Complete by: As directed    No intercourse for 12 weeks          Signed: Salvadore Dom 10/31/2021, 5:25 PM

## 2021-10-31 NOTE — Discharge Instructions (Signed)

## 2021-10-31 NOTE — Progress Notes (Signed)
Dr Gwendolyn Lima here to evaluate patient for discharge.

## 2021-10-31 NOTE — Interval H&P Note (Signed)
History and Physical Interval Note:  10/31/2021 7:22 AM  Christy Lowe  has presented today for surgery, with the diagnosis of abnormal uterine bleeding, severe dysmenorrhea, history of endometriosis.  The various methods of treatment have been discussed with the patient and family. After consideration of risks, benefits and other options for treatment, the patient has consented to  Procedure(s): TOTAL LAPAROSCOPIC HYSTERECTOMY WITH SALPINGECTOMY, possible treatment of endometriosis (Bilateral) as a surgical intervention.  The patient's history has been reviewed, patient examined, no change in status, stable for surgery.  I have reviewed the patient's chart and labs.  Questions were answered to the patient's satisfaction.     Salvadore Dom

## 2021-10-31 NOTE — Op Note (Addendum)
Preoperative Diagnosis: Severe dysmenorrhea, abnormal uterine bleeding with intrauterine device  Postoperative Diagnosis: Same  Procedure:  Total Laparoscopic Hysterectomy with bilateral salpingectomies and cystoscopy  Surgeon: Dr Sumner Boast  Assistant: Dr Josefa Half, an MD assistant was necessary for tissue manipulation, retraction and positioning due to the complexity of the case and hospital policies. In addition, Dr Quincy Simmonds specifically helped with taking down the right side of the uterus and closing the skin incisions.   Anesthesia: General  EBL: 25 cc  Fluids: 1,000 cc  Urine output: 50 cc  Indications for surgery: The patient is a 40 year old female, who presented with a history of severe endometriosis, prior surgery for mild endometriosis and intolerable breakthrough bleeding with a mirena intrauterine device. Work up included a normal pelvic ultrasound. She desires definitive surgery. The patient is aware of the risks and complications involved with the surgery and consent was obtained prior to the procedure.  Findings: Normal uterus and bilateral adnexa, normal appendix, normal liver edge.   Procedure: The patient was taken to the operating room with an IV in placed, preoperative antibiotics had been administered. She was placed in the dorsal lithotomy position. General anesthesia was administered. She was prepped and draped in the usual sterile fashion for an abdominal, vaginal surgery. A rumi uterine manipulator was placed, using a # 3 cup and a 6 cm extender. A foley catheter was placed.   The patient has a history of an umbilical hernia repair, so a left upper quadrant entry was needed. The skin was marked with a marker approximately 3 cm under the ribcage on the left in the midclavicular line. This area was injected with 0.25% marcaine and incised with a #11 blade. The the 5 mm laparoscope was placed into the abdominal cavity using the opti-view trocar. The abdominal cavity  was insufflated with CO2. The patient was placed in trendelenburg and the abdominal pelvic cavity was inspected. 3 more trocars were placed: 1 in each mid lateral, lower quadrant and one in the midline approximately 10 cm above the pubic symphysis in the midline. These areas were injected with 0.25% marcaine, incised with a #11 blade and all trocars were inserted with direct visualization with the laparoscope. A # 5 airseal trocar was placed in the RLQ, a 5 mm trocar in the LLQ and a #8 trocar in the midline. The abdominal pelvic cavity was again inspected. A mixture of 30 cc of Robivacaine and 30 cc of NS was place in the pelvic cavity. The ureters were identified bilaterally.  The left tube was elevated from the pelvic sidewall, cauterized and cut with the ligasure device. The mesosalpinx was cauterized and cut with the ligasure device. The tube was separated from the uterus using the ligasure device and removed through the midline trocar. The left uterine ovarian ligament was cauterized and cut with the ligasure device. The left round ligament was cauterized and cut with the ligasure device and the anterior and posterior leafs of the broad ligament were taken down with the ligasure device. The harmonic scalpel was then used to take down the bladder flap and skeltonize the vessels. The left uterine vessels were then clamped, cauterized and ligated with the ligasure device. Hemostasis was excellent. The same procedure was repeated on the right.   Using the rumi manipulator the uterus was pushed up in the pelvic cavity and the harmonic scalpel was used to separate the cervix from the vagina using the harmonic energy. The uterus was  removed vaginally at this  time. An occluder was placed in the vagina to maintain pneumoperitoneum. The vaginal cuff was then closed with a 0 stratifix suture. Hemostasis was excellent. The abdominal pelvic cavity was irrigated and suctioned dry. Pressure was released and hemostasis  remained excellent.   The abdominal cavity was desufflated and the trocars were removed. The skin was closed with subcuticular stiches of 4-0 vicryl and dermabond was placed over the incisions.  The foley catheter was removed and cystoscopy was performed using a 70 degree scope. Both ureters expelled urine, no bladder abnormalities were noted. The bladder was allowed to drain and the cystoscope was removed. The vaginal cuff was inspected and was hemostatic.  The patient's abdomen and perineum were cleansed and she was taken out of the dorsal lithotomy position. Upon awakening she was extubated and taken to the recovery room in stable condition. The sponge and instrument counts were correct.   Addendum Under findings: normal bladder mucosa, normal ureteral jets bilaterally.

## 2021-10-31 NOTE — Transfer of Care (Signed)
Immediate Anesthesia Transfer of Care Note  Patient: Christy Lowe  Procedure(s) Performed: TOTAL LAPAROSCOPIC HYSTERECTOMY WITH SALPINGECTOMY (Bilateral: Uterus) CYSTOSCOPY (Bladder)  Patient Location: PACU  Anesthesia Type:General  Level of Consciousness: awake, alert  and oriented  Airway & Oxygen Therapy: Patient Spontanous Breathing and Patient connected to face mask oxygen  Post-op Assessment: Report given to RN and Post -op Vital signs reviewed and stable  Post vital signs: Reviewed and stable  Last Vitals:  Vitals Value Taken Time  BP 108/65 10/31/21 0933  Temp    Pulse 81 10/31/21 0937  Resp 10 10/31/21 0937  SpO2 100 % 10/31/21 0937  Vitals shown include unvalidated device data.  Last Pain:  Vitals:   10/31/21 0602  TempSrc: Oral  PainSc: 0-No pain      Patients Stated Pain Goal: 7 ([id number])  Complications: No notable events documented.

## 2021-10-31 NOTE — Anesthesia Procedure Notes (Signed)
Procedure Name: Intubation Date/Time: 10/31/2021 7:38 AM Performed by: Genelle Bal, CRNA Pre-anesthesia Checklist: Patient identified, Emergency Drugs available, Suction available and Patient being monitored Patient Re-evaluated:Patient Re-evaluated prior to induction Oxygen Delivery Method: Circle system utilized Preoxygenation: Pre-oxygenation with 100% oxygen Induction Type: IV induction Ventilation: Mask ventilation without difficulty Laryngoscope Size: Miller and 2 Grade View: Grade I Tube type: Oral Tube size: 7.0 mm Number of attempts: 1 Airway Equipment and Method: Stylet and Oral airway Placement Confirmation: ETT inserted through vocal cords under direct vision, positive ETCO2 and breath sounds checked- equal and bilateral Secured at: 21 cm Tube secured with: Tape Dental Injury: Teeth and Oropharynx as per pre-operative assessment

## 2021-10-31 NOTE — Anesthesia Postprocedure Evaluation (Signed)
Anesthesia Post Note  Patient: Christy Lowe  Procedure(s) Performed: TOTAL LAPAROSCOPIC HYSTERECTOMY WITH SALPINGECTOMY (Bilateral: Uterus) CYSTOSCOPY (Bladder)     Patient location during evaluation: PACU Anesthesia Type: General Level of consciousness: awake and alert Pain management: pain level controlled Vital Signs Assessment: post-procedure vital signs reviewed and stable Respiratory status: spontaneous breathing, nonlabored ventilation, respiratory function stable and patient connected to nasal cannula oxygen Cardiovascular status: blood pressure returned to baseline and stable Postop Assessment: no apparent nausea or vomiting Anesthetic complications: no   No notable events documented.  Last Vitals:  Vitals:   10/31/21 1020 10/31/21 1045  BP: 114/77 118/79  Pulse: 64 77  Resp: 14 14  Temp: 36.7 C 36.7 C  SpO2: 98% 96%    Last Pain:  Vitals:   10/31/21 1045  TempSrc:   PainSc: 3                  Jeffifer Rabold

## 2021-11-01 ENCOUNTER — Encounter (HOSPITAL_BASED_OUTPATIENT_CLINIC_OR_DEPARTMENT_OTHER): Payer: Self-pay | Admitting: Obstetrics and Gynecology

## 2021-11-01 LAB — SURGICAL PATHOLOGY

## 2021-11-04 ENCOUNTER — Other Ambulatory Visit: Payer: Self-pay | Admitting: Internal Medicine

## 2021-11-04 DIAGNOSIS — R232 Flushing: Secondary | ICD-10-CM | POA: Insufficient documentation

## 2021-11-04 DIAGNOSIS — L299 Pruritus, unspecified: Secondary | ICD-10-CM

## 2021-11-04 NOTE — Telephone Encounter (Signed)
Next Visit: 12/13/2021  Last Visit: 09/09/2021  Last Fill: 09/09/2021  Dx: Itching  Current Dose per office note on 10: hydrOXYzine (ATARAX/VISTARIL) 25 MG tablet  Okay to refill Hydroxyzine?

## 2021-11-07 ENCOUNTER — Encounter: Payer: Self-pay | Admitting: Obstetrics and Gynecology

## 2021-11-07 ENCOUNTER — Ambulatory Visit (INDEPENDENT_AMBULATORY_CARE_PROVIDER_SITE_OTHER): Payer: 59 | Admitting: Obstetrics and Gynecology

## 2021-11-07 ENCOUNTER — Other Ambulatory Visit: Payer: Self-pay

## 2021-11-07 VITALS — BP 130/72 | HR 77 | Ht 67.0 in | Wt 188.0 lb

## 2021-11-07 DIAGNOSIS — Z90710 Acquired absence of both cervix and uterus: Secondary | ICD-10-CM

## 2021-11-07 DIAGNOSIS — H538 Other visual disturbances: Secondary | ICD-10-CM | POA: Insufficient documentation

## 2021-11-07 NOTE — Progress Notes (Signed)
GYNECOLOGY  VISIT   HPI: 40 y.o.   Single White or Caucasian Not Hispanic or Latino  female   G0P0000 with Patient's last menstrual period was 10/10/2021.   here for one week post op visit following total laparoscopic hysterectomy.  Patient states that on Saturday she was going down and step and the step was father than she thought and she jarred her whole body and is a little sore from that. Otherwise states that she is doing well.  Pathology was benign, + adenomyosis.   She has gone from constipation to diarrhea. She took OTC medication for constipation and had diarrhea. Last BM was yesterday, was diarrhea.   Voiding fine. No significant pain.   GYNECOLOGIC HISTORY: Patient's last menstrual period was 10/10/2021. Contraception:Hysterectomy  Menopausal hormone therapy: none         OB History     Gravida  0   Para  0   Term  0   Preterm  0   AB  0   Living  0      SAB  0   IAB  0   Ectopic  0   Multiple  0   Live Births  0              Patient Active Problem List   Diagnosis Date Noted   S/P laparoscopic hysterectomy 10/31/2021   Itching 09/09/2021   High risk medication use 07/25/2021   Finger joint swelling, left 04/01/2021   Systemic lupus ([place]) 04/01/2021   Fibromyalgia 04/01/2021   Alopecia 11/17/2020   Other fatigue 10/12/2020   Migraine without aura    History of DVT (deep vein thrombosis)    History of endometriosis    Right-sided muscle weakness 05/15/2019   Abnormal finding on MRI of brain 05/15/2019   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   Tremor 12/24/2017    Past Medical History:  Diagnosis Date   aub    Bruises easily 22/12/5425   Complication of anesthesia 10/15/2020   hypotension   Fibromyalgia    GERD (gastroesophageal reflux disease)    Heart murmur  PFO    off 81 mg aspirin since feb 2022 per pcp  no cardiologist   Hemangioma    History of DVT (deep vein thrombosis)    right le 7 yrs ago, right leg slight swollen    History of endometriosis    History of hiatal hernia    Hypotension    with 10-15-2020 endoscopy   Migraine without aura    Systemic lupus erythematosus ([place])    TIA    ages 55 and 22   Viral meningitis    age 40    Past Surgical History:  Procedure Laterality Date   ANTERIOR CRUCIATE LIGAMENT REPAIR Left 10/2018   CYSTOSCOPY  10/31/2021   Procedure: CYSTOSCOPY;  Surgeon: Salvadore Dom, MD;  Location: [facility];  Service: Gynecology;;   PELVIC LAPAROSCOPY     for endometriosis yrs ago per pt on 10-24-2021   right knee lateral release and meniscal repair  2011   TONSILLECTOMY     age 89, adenoids removed also   TOTAL LAPAROSCOPIC HYSTERECTOMY WITH SALPINGECTOMY Bilateral 10/31/2021   Procedure: TOTAL LAPAROSCOPIC HYSTERECTOMY WITH SALPINGECTOMY;  Surgeon: Salvadore Dom, MD;  Location: [facility];  Service: Gynecology;  Laterality: Bilateral;   UMBILICAL HERNIA REPAIR     yrs ago   UPPER GI ENDOSCOPY     with esophagus stretching 10-15-2020, 03-11-2020, 05-09-2019,  2013 with being polyp removed    Current Outpatient Medications  Medication Sig Dispense Refill   chlorhexidine (PERIDEX) 0.12 % solution as needed.     cyclobenzaprine (FLEXERIL) 10 MG tablet TAKE 1 TABLET BY MOUTH AT BEDTIME AS NEEDED FOR MUSCLE SPASMS. (Patient taking differently: at bedtime.) 30 tablet 1   hydroxychloroquine (PLAQUENIL) 200 MG tablet Take 2 tablets (400 mg total) by mouth daily. (Patient taking differently: Take 400 mg by mouth at bedtime.) 60 tablet 2   hydrOXYzine (ATARAX) 25 MG tablet TAKE 1 TABLET BY MOUTH AT BEDTIME AS NEEDED FOR ITCHING. 30 tablet 1   predniSONE (DELTASONE) 5 MG tablet Take 1 tablet (5 mg total) by mouth daily with breakfast. (Patient taking differently: Take 10 mg by mouth daily with breakfast.) 30 tablet 2   sucralfate (CARAFATE) 1 GM/10ML suspension Take 10 mLs (1 g total) by mouth 4 (four) times daily. (Patient taking differently:  Take 1 g by mouth as needed.) 420 mL 1   VITAMIN D PO Take 1,000 Units by mouth daily.     Belimumab (BENLYSTA) 200 MG/ML SOAJ Inject 200 mg into the skin once a week. (Patient not taking: Reported on 11/07/2021) 12 mL 0   No current facility-administered medications for this visit.     ALLERGIES: Cephalexin, Penicillins, Amitiza [lubiprostone], and Sulfa antibiotics  Family History  Problem Relation Age of Onset   Alcohol abuse Mother    Cancer Father        Lung and Colon   Heart attack Father    COPD Father    Diabetes Maternal Grandmother    Cervical cancer Maternal Grandmother    Fibromyalgia Maternal Grandmother    Prostate cancer Maternal Grandfather    Heart attack Maternal Grandfather    ALS Paternal Grandmother    Prostate cancer Paternal Grandfather    Heart attack Paternal Grandfather    Esophageal cancer Paternal Grandfather    Breast cancer Other    Colon cancer Neg Hx    Stomach cancer Neg Hx    Rectal cancer Neg Hx     Social History   Socioeconomic History   Marital status: Single    Spouse name: Not on file   Number of children: Not on file   Years of education: Not on file   Highest education level: Not on file  Occupational History   Not on file  Tobacco Use   Smoking status: Never   Smokeless tobacco: Never  Vaping Use   Vaping Use: Never used  Substance and Sexual Activity   Alcohol use: Not Currently   Drug use: Never   Sexual activity: Yes    Birth control/protection: Surgical    Comment: partner with vasectomy  Other Topics Concern   Not on file  Social History Narrative   Not on file   Social Determinants of Health   Financial Resource Strain: Not on file  Food Insecurity: Not on file  Transportation Needs: Not on file  Physical Activity: Not on file  Stress: Not on file  Social Connections: Not on file  Intimate Partner Violence: Not on file    Review of Systems  All other systems reviewed and are negative.  PHYSICAL  EXAMINATION:    BP 130/72   Pulse 77   Ht 5\' 7"  (1.702 m)   Wt 188 lb (85.3 kg)   LMP 10/10/2021   SpO2 99%   BMI 29.44 kg/m     General appearance: alert, cooperative and appears stated age Abdomen:  soft, non-tender; non distended, no masses,  no organomegaly Incisions: clean, dry and intact without erythema.   1. S/P laparoscopic hysterectomy Doing well Routine f/u

## 2021-11-14 DIAGNOSIS — L539 Erythematous condition, unspecified: Secondary | ICD-10-CM | POA: Insufficient documentation

## 2021-11-24 ENCOUNTER — Other Ambulatory Visit: Payer: Self-pay

## 2021-11-24 ENCOUNTER — Encounter: Payer: Self-pay | Admitting: Obstetrics and Gynecology

## 2021-11-24 ENCOUNTER — Ambulatory Visit (INDEPENDENT_AMBULATORY_CARE_PROVIDER_SITE_OTHER): Payer: 59 | Admitting: Obstetrics and Gynecology

## 2021-11-24 VITALS — BP 110/70 | HR 72 | Wt 189.0 lb

## 2021-11-24 DIAGNOSIS — Z90710 Acquired absence of both cervix and uterus: Secondary | ICD-10-CM

## 2021-11-24 NOTE — Progress Notes (Signed)
GYNECOLOGY  VISIT   HPI: 40 y.o.   Single White or Caucasian Not Hispanic or Latino  female   G0P0000 with Patient's last menstrual period was 10/10/2021.   here for  4 wk post op s/p TLH/BS. She states that she has been doing well. She has had no pain, no bowel or bladder c/o.   GYNECOLOGIC HISTORY: Patient's last menstrual period was 10/10/2021. Contraception:hysterectomy  Menopausal hormone therapy: none         OB History     Gravida  0   Para  0   Term  0   Preterm  0   AB  0   Living  0      SAB  0   IAB  0   Ectopic  0   Multiple  0   Live Births  0              Patient Active Problem List   Diagnosis Date Noted   S/P laparoscopic hysterectomy 10/31/2021   Itching 09/09/2021   High risk medication use 07/25/2021   Finger joint swelling, left 04/01/2021   Systemic lupus ([place]) 04/01/2021   Fibromyalgia 04/01/2021   Alopecia 11/17/2020   Other fatigue 10/12/2020   Migraine without aura    History of DVT (deep vein thrombosis)    History of endometriosis    Right-sided muscle weakness 05/15/2019   Abnormal finding on MRI of brain 05/15/2019   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   Tremor 12/24/2017    Past Medical History:  Diagnosis Date   aub    Bruises easily 44/11/270   Complication of anesthesia 10/15/2020   hypotension   Fibromyalgia    GERD (gastroesophageal reflux disease)    Heart murmur  PFO    off 81 mg aspirin since feb 2022 per pcp  no cardiologist   Hemangioma    History of DVT (deep vein thrombosis)    right le 7 yrs ago, right leg slight swollen   History of endometriosis    History of hiatal hernia    Hypotension    with 10-15-2020 endoscopy   Migraine without aura    Systemic lupus erythematosus ([place])    TIA    ages 80 and 48   Viral meningitis    age 32    Past Surgical History:  Procedure Laterality Date   ANTERIOR CRUCIATE LIGAMENT REPAIR Left 10/2018   CYSTOSCOPY  10/31/2021   Procedure: CYSTOSCOPY;   Surgeon: Salvadore Dom, MD;  Location: [facility];  Service: Gynecology;;   PELVIC LAPAROSCOPY     for endometriosis yrs ago per pt on 10-24-2021   right knee lateral release and meniscal repair  2011   TONSILLECTOMY     age 70, adenoids removed also   TOTAL LAPAROSCOPIC HYSTERECTOMY WITH SALPINGECTOMY Bilateral 10/31/2021   Procedure: TOTAL LAPAROSCOPIC HYSTERECTOMY WITH SALPINGECTOMY;  Surgeon: Salvadore Dom, MD;  Location: [facility];  Service: Gynecology;  Laterality: Bilateral;   UMBILICAL HERNIA REPAIR     yrs ago   UPPER GI ENDOSCOPY     with esophagus stretching 10-15-2020, 03-11-2020, 05-09-2019,  2013 with being polyp removed    Current Outpatient Medications  Medication Sig Dispense Refill   Belimumab (BENLYSTA) 200 MG/ML SOAJ Inject 200 mg into the skin once a week. (Patient not taking: Reported on 11/07/2021) 12 mL 0   chlorhexidine (PERIDEX) 0.12 % solution as needed.     cyclobenzaprine (FLEXERIL) 10 MG tablet TAKE 1  TABLET BY MOUTH AT BEDTIME AS NEEDED FOR MUSCLE SPASMS. (Patient taking differently: at bedtime.) 30 tablet 1   hydroxychloroquine (PLAQUENIL) 200 MG tablet Take 2 tablets (400 mg total) by mouth daily. (Patient taking differently: Take 400 mg by mouth at bedtime.) 60 tablet 2   hydrOXYzine (ATARAX) 25 MG tablet TAKE 1 TABLET BY MOUTH AT BEDTIME AS NEEDED FOR ITCHING. 30 tablet 1   predniSONE (DELTASONE) 5 MG tablet Take 1 tablet (5 mg total) by mouth daily with breakfast. (Patient taking differently: Take 10 mg by mouth daily with breakfast.) 30 tablet 2   sucralfate (CARAFATE) 1 GM/10ML suspension Take 10 mLs (1 g total) by mouth 4 (four) times daily. (Patient taking differently: Take 1 g by mouth as needed.) 420 mL 1   VITAMIN D PO Take 1,000 Units by mouth daily.     No current facility-administered medications for this visit.     ALLERGIES: Cephalexin, Penicillins, Amitiza [lubiprostone], and Sulfa  antibiotics  Family History  Problem Relation Age of Onset   Alcohol abuse Mother    Cancer Father        Lung and Colon   Heart attack Father    COPD Father    Diabetes Maternal Grandmother    Cervical cancer Maternal Grandmother    Fibromyalgia Maternal Grandmother    Prostate cancer Maternal Grandfather    Heart attack Maternal Grandfather    ALS Paternal Grandmother    Prostate cancer Paternal Grandfather    Heart attack Paternal Grandfather    Esophageal cancer Paternal Grandfather    Breast cancer Other    Colon cancer Neg Hx    Stomach cancer Neg Hx    Rectal cancer Neg Hx     Social History   Socioeconomic History   Marital status: Single    Spouse name: Not on file   Number of children: Not on file   Years of education: Not on file   Highest education level: Not on file  Occupational History   Not on file  Tobacco Use   Smoking status: Never   Smokeless tobacco: Never  Vaping Use   Vaping Use: Never used  Substance and Sexual Activity   Alcohol use: Not Currently   Drug use: Never   Sexual activity: Yes    Birth control/protection: Surgical    Comment: partner with vasectomy  Other Topics Concern   Not on file  Social History Narrative   Not on file   Social Determinants of Health   Financial Resource Strain: Not on file  Food Insecurity: Not on file  Transportation Needs: Not on file  Physical Activity: Not on file  Stress: Not on file  Social Connections: Not on file  Intimate Partner Violence: Not on file    Review of Systems  All other systems reviewed and are negative.  PHYSICAL EXAMINATION:    LMP 10/10/2021     General appearance: alert, cooperative and appears stated age Abdomen: soft, non-tender; non distended, no masses,  no organomegaly Incisions: well healed  Pelvic: External genitalia:  no lesions              Urethra:  normal appearing urethra with no masses, tenderness or lesions              Bartholins and Skenes:  normal                 Vagina: normal appearing vagina with normal color and discharge, no lesions. The vaginal cuff is healing  well              Cervix: absent              Bimanual Exam:  Uterus:  uterus absent              Adnexa: no mass, fullness, tenderness                Chaperone was present for exam.  1. S/P laparoscopic hysterectomy Doing well Pelvic rest until 12 weeks post op Routine f/u

## 2021-11-27 ENCOUNTER — Other Ambulatory Visit: Payer: Self-pay | Admitting: Internal Medicine

## 2021-11-29 ENCOUNTER — Telehealth: Payer: Self-pay

## 2021-11-29 DIAGNOSIS — M329 Systemic lupus erythematosus, unspecified: Secondary | ICD-10-CM

## 2021-11-29 NOTE — Telephone Encounter (Signed)
Patient called stating she has new [employer] (copy of card front & back in chart) which is requiring a prior authorization for her prescription refill of Benlysta.   Patient states she also needs to apply to co-pay card.  Patient was informed the cost is $5,500 per month.

## 2021-12-02 ENCOUNTER — Other Ambulatory Visit: Payer: Self-pay | Admitting: Internal Medicine

## 2021-12-02 DIAGNOSIS — L299 Pruritus, unspecified: Secondary | ICD-10-CM

## 2021-12-02 NOTE — Telephone Encounter (Signed)
Submitted a Prior Authorization request to [facility] for BENLYSTA via CoverMyMeds. Will update once we receive a response.  Key: [id number]  Knox Saliva, PharmD, MPH, BCPS Clinical Pharmacist (Rheumatology and Pulmonology)

## 2021-12-07 NOTE — Telephone Encounter (Signed)
Prepared Benlysta re-auth clinical questions however chart note from 09/09/21 still remains unsigned. Routing to Dr. Benjamine Mola since we need updated chart notes indicating change in SLE after starting Benlysta on 08/11/21  Knox Saliva, PharmD, MPH, BCPS Clinical Pharmacist (Rheumatology and Pulmonology)

## 2021-12-07 NOTE — Telephone Encounter (Signed)
Faxed Benlysta clinical questions to [facility] with chart notes  Fax: [phone] Phone: [phone]  Knox Saliva, PharmD, MPH, BCPS Clinical Pharmacist (Rheumatology and Pulmonology), option 3  Knox Saliva, PharmD, MPH, BCPS Clinical Pharmacist (Rheumatology and Pulmonology)

## 2021-12-07 NOTE — Telephone Encounter (Signed)
Sorry about that, note is signed now. Thanks.

## 2021-12-12 NOTE — Progress Notes (Signed)
Office Visit Note  Patient: Christy Lowe             Date of Birth: [birth year]           MRN: [id number]             PCP: Horald Pollen, MD Referring: Horald Pollen, * Visit Date: 12/13/2021   Subjective:   History of Present Illness: Christy Lowe is a 41 y.o. female here for follow up for systemic lupus with ongoing joint pains and skin rash on HCQ 400 mg daily, Benlysta 200 mg [place] weekly, and prednisone 10 mg daily. Since our last visit she went for hysterectomy this was uneventful and has recovered well. She was off the benlysta for about 1 month in total around the surgery. She has not felt much improvement so far still having a lot of fatigue, skin rash and flushing, joint pain in several areas with left wrist swelling. Headaches are about the same as before but now having recurrent episodes of vertigo. She is now taking hydroxyzine twice on some days due to persistent itching, and feels excessively hot most of the time. She Is back to taking 10 mg of prednisone when trying to drop this to 5 mg felt extreme fatigue and back pain that felt like bruising and stiffness all over her body.  Previous HPI 09/09/21 Christy Lowe is a 41 y.o. female here for follow up for lupus with ongoing symptoms of primarily joint pain and swelling and skin rashes on hydroxychloroquine 400 mg PO daily, prednisone 5 mg Po daily, and after starting Benlysta 200 mg [name] weekly about 6 weeks ago.  She is now taken 5 doses she had bruising in her thigh after initial injection but since switching to abdominal injection sites this has not recurred.  She has noticed increased problems with itching in multiple sites especially on her distal arms and legs with no particular visible rashes associated except for the excoriations after her scratching.  She is using lotion very regularly for the symptoms and with dry skin.  Still has some ongoing oral ulcers.  No particular flareup of joint pains or  swelling. She saw the ophthalmologist for her eye exam which was fine but also started drops for severe dryness. For about 2 weeks she also describes some change with her urination feeling decree sensation of need until having urinary urgency.  She does not notice any pain any blood or any incontinence.   Previous HPI 11/17/20 Christy Lowe is a 41 y.o. female here for evaluation of positive ANA. She is noticing multiple symptoms including generalized alopecia, oral dryness, left sided neck swelling, joint pain and stiffness, position tremor, and episodic skin rashes. She has had multiple ongoing problems since many years ago. She was hospitalized for viral meningitis at age 20 and feels she has been susceptible to infections ever since this time or before, with numerous cases of bronchitis and has had some lung nodules on CT imaging during these repeat episodes. About 7 years ago she suffered from a RLE DVT provoked after right knee arthroscopy and developed right sided face and arm numbness. Workup indicated she had a TIA also noted to have PFO. She takes ASA since that time and has taken anticoagulation in perioperative periods. Workup with MRI and LP studies were apparently also indicative for MS. She was treated with multiple rounds of steroids for months at a time and felt a lot of mood disturbance from these.  She was never started on other immunomodulatory or immunosuppressive treatments.   09/2020 ANA positive dsDNA 24   Review of Systems  Constitutional:  Positive for fatigue.  HENT:  Positive for mouth sores. Negative for mouth dryness and nose dryness.   Eyes:  Positive for visual disturbance and dryness. Negative for pain and itching.  Respiratory:  Negative for shortness of breath and difficulty breathing.   Cardiovascular:  Negative for chest pain and palpitations.  Gastrointestinal:  Negative for blood in stool, constipation and diarrhea.  Endocrine: Negative for increased  urination.  Genitourinary:  Negative for difficulty urinating.  Musculoskeletal:  Positive for joint pain, joint pain, joint swelling and morning stiffness. Negative for myalgias, muscle tenderness and myalgias.  Skin:  Positive for redness. Negative for color change and rash.  Allergic/Immunologic: Negative for susceptible to infections.  Neurological:  Positive for dizziness, headaches, memory loss and weakness. Negative for numbness.  Hematological:  Positive for bruising/bleeding tendency.  Psychiatric/Behavioral:  Positive for confusion.    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   Tremor 12/24/2017    Past Medical History:  Diagnosis Date   aub    Bruises easily 36/62/9476   Complication of anesthesia 10/15/2020   hypotension   Fibromyalgia    GERD (gastroesophageal reflux disease)    Heart murmur  PFO    off 81 mg aspirin since feb 2022 per pcp  no cardiologist   Hemangioma    History of DVT (deep vein thrombosis)    right le 7 yrs ago, right leg slight swollen   History of endometriosis    History of hiatal hernia    Hypotension    with 10-15-2020 endoscopy   Migraine without aura    Systemic lupus erythematosus ([place])    TIA    ages 85 and 77   Viral meningitis    age 3    Family History  Problem Relation Age of Onset   Alcohol abuse Mother    Cancer Father        Lung and Colon   Heart attack Father    COPD Father    Diabetes Maternal Grandmother    Cervical  cancer Maternal Grandmother    Fibromyalgia Maternal Grandmother    Prostate cancer Maternal Grandfather    Heart attack Maternal Grandfather    ALS Paternal Grandmother    Prostate cancer Paternal Grandfather    Heart attack Paternal Grandfather    Esophageal cancer Paternal Grandfather    Breast cancer Other    Colon cancer Neg Hx    Stomach cancer Neg Hx    Rectal cancer Neg Hx    Past Surgical History:  Procedure Laterality Date   ANTERIOR CRUCIATE LIGAMENT REPAIR Left 10/2018   CYSTOSCOPY  10/31/2021   Procedure: CYSTOSCOPY;  Surgeon: Salvadore Dom, MD;  Location: [place];  Service: Gynecology;;   PELVIC LAPAROSCOPY     for endometriosis yrs ago per pt on 10-24-2021  right knee lateral release and meniscal repair  2011   TONSILLECTOMY     age 65, adenoids removed also   TOTAL LAPAROSCOPIC HYSTERECTOMY WITH SALPINGECTOMY Bilateral 10/31/2021   Procedure: TOTAL LAPAROSCOPIC HYSTERECTOMY WITH SALPINGECTOMY;  Surgeon: Salvadore Dom, MD;  Location[facility];  Service: Gynecology;  Laterality: Bilateral;   UMBILICAL HERNIA REPAIR     yrs ago   UPPER GI ENDOSCOPY     with esophagus stretching 10-15-2020, 03-11-2020, 05-09-2019,  2013 with being polyp removed   Social History   Social History Narrative   Not on file   Immunization History  Administered Date(s) Administered   PFIZER(Purple Top)SARS-COV-2 Vaccination 06/27/2020, 07/28/2020   PPD Test 01/23/2019     Objective: Vital Signs: BP 114/81 (BP Location: Left Arm, Patient Position: Sitting, Cuff Size: Normal)    Pulse 84    Ht 5\' 7"  (1.702 m)    Wt 190 lb 12.8 oz (86.5 kg)    LMP 10/10/2021    BMI 29.88 kg/m    Physical Exam Cardiovascular:     Rate and Rhythm: Normal rate and regular rhythm.  Pulmonary:     Effort: Pulmonary effort is normal.     Breath sounds: Normal breath sounds.  Skin:    General: Skin is warm and dry.     Comments: Diffuse, blanching  erythematous skin on face and neck and upper chest No extremity rashes  Neurological:     Mental Status: She is alert.     Musculoskeletal Exam:  Shoulders full ROM, tenderness to pressure in upper back and shoulder muscles Elbows full ROM no tenderness or swelling Left wrist swelling present and probably ganglion cyst next to FCR Fingers full ROM, tenderness at multiple sites predominantly DIP joint mild nodules Knees full ROM no tenderness or swelling Ankles full ROM no tenderness or swelling   Investigation: No additional findings.  Imaging: No results found.  Recent Labs: Lab Results  Component Value Date   WBC 11.7 (H) 10/31/2021   HGB 13.0 10/31/2021   PLT 257 10/31/2021   NA 135 10/26/2021   K 4.5 10/26/2021   CL 104 10/26/2021   CO2 25 10/26/2021   GLUCOSE 119 (H) 10/26/2021   BUN 18 10/26/2021   CREATININE 0.90 10/26/2021   BILITOT 0.4 10/26/2021   ALKPHOS 47 10/26/2021   AST 25 10/26/2021   ALT 23 10/26/2021   PROT 7.6 10/26/2021   ALBUMIN 4.9 10/26/2021   CALCIUM 9.3 10/26/2021   GFRAA >60 03/10/2020   QFTBGOLDPLUS NEGATIVE 07/25/2021    Speciality Comments: PLQ Eye Exam [facility] 08/17/2021 WNL Follow-up: 12 months   Procedures:  No procedures performed Allergies: Cephalexin, Penicillins, Amitiza [lubiprostone], and Sulfa antibiotics   Assessment / Plan:     Visit Diagnoses: Systemic lupus erythematosus, unspecified SLE type, unspecified organ involvement status ([place]) - Plan: Sedimentation rate, Anti-DNA antibody, double-stranded, C3 and C4, predniSONE (DELTASONE) 2.5 MG tablet, predniSONE (DELTASONE) 5 MG tablet  She continues having significant disease activity especially joint pain and swelling and skin rashes and possibly her headaches and vertigo symptoms are related. dsDNA has remained elevated rechecking again today along with serum complements. SELENA-SLEDAI score of at least 8. Now back on prednisone 10 mg daily for this, has  some weight gain and mood swings with this. Continuing HCQ 400 mg daily and benlysta 200 mg [place] weekly. If she does not get any more benefit allowing successful prednisone reduction with continuing benlysta for another 3 months would consider switching  alternative treatment such as saphnelo.  High risk medication use - Plan: CBC with Differential/Platelet, COMPLETE METABOLIC PANEL WITH GFR  Checking CBC and CMP for Benlysta medication monitoring. No interval complications.  Itching - Plan: hydrOXYzine (ATARAX) 25 MG tablet  Continued skin itching reasonably controlled without much side effect.  Orders: Orders Placed This Encounter  Procedures   Sedimentation rate   Anti-DNA antibody, double-stranded   C3 and C4   CBC with Differential/Platelet   COMPLETE METABOLIC PANEL WITH GFR   Meds ordered this encounter  Medications   predniSONE (DELTASONE) 2.5 MG tablet    Sig: Take along with 5 mg tablets for total daily dose 10 mg taper to 7.5 mg as tolerated    Dispense:  30 tablet    Refill:  2   predniSONE (DELTASONE) 5 MG tablet    Sig: Take along with 2.5 mg tablets for total daily dose 10 mg taper to 7.5 mg as tolerated    Dispense:  60 tablet    Refill:  2   hydroxychloroquine (PLAQUENIL) 200 MG tablet    Sig: Take 2 tablets (400 mg total) by mouth daily.    Dispense:  180 tablet    Refill:  1   hydrOXYzine (ATARAX) 25 MG tablet    Sig: Take at night or up to 2 times daily as needed for itching    Dispense:  60 tablet    Refill:  1     Follow-Up Instructions: Return in about 3 months (around 03/13/2022) for SLE on BEN/HCQ/GC f/u 45mos.   Collier Salina, MD  Note - This record has been created using [employer].  Chart creation errors have been sought, but may not always  have been located. Such creation errors do not reflect on  the standard of medical care.

## 2021-12-13 ENCOUNTER — Encounter: Payer: Self-pay | Admitting: Internal Medicine

## 2021-12-13 ENCOUNTER — Ambulatory Visit: Payer: BC Managed Care – PPO | Admitting: Internal Medicine

## 2021-12-13 ENCOUNTER — Other Ambulatory Visit: Payer: Self-pay

## 2021-12-13 VITALS — BP 114/81 | HR 84 | Ht 67.0 in | Wt 190.8 lb

## 2021-12-13 DIAGNOSIS — M329 Systemic lupus erythematosus, unspecified: Secondary | ICD-10-CM | POA: Diagnosis not present

## 2021-12-13 DIAGNOSIS — L299 Pruritus, unspecified: Secondary | ICD-10-CM | POA: Diagnosis not present

## 2021-12-13 DIAGNOSIS — M359 Systemic involvement of connective tissue, unspecified: Secondary | ICD-10-CM | POA: Diagnosis not present

## 2021-12-13 DIAGNOSIS — Z79899 Other long term (current) drug therapy: Secondary | ICD-10-CM | POA: Diagnosis not present

## 2021-12-13 MED ORDER — PREDNISONE 2.5 MG PO TABS: ORAL_TABLET | ORAL | 2 refills | Status: DC

## 2021-12-13 MED ORDER — HYDROXYCHLOROQUINE SULFATE 200 MG PO TABS: 400.0000 mg | ORAL_TABLET | Freq: Every day | ORAL | 1 refills | Status: DC

## 2021-12-13 MED ORDER — HYDROXYZINE HCL 25 MG PO TABS: ORAL_TABLET | ORAL | 1 refills | Status: DC

## 2021-12-13 MED ORDER — PREDNISONE 5 MG PO TABS: ORAL_TABLET | ORAL | 2 refills | Status: DC

## 2021-12-13 NOTE — Telephone Encounter (Signed)
Received a fax regarding Prior Authorization from [facility] for [place].  Authorization has been DENIED because labs were not submitted indicating SLE diagnosis and standardized rating scale score showing SLE control has not been submitted (SELENA-SLEDAI score of 6 or more)  Patient had OV today - after discussion with Dr. Benjamine Mola, he will plan to include score in office visit note from today.  Phone # [phone]  Patient states that she has two Benlysta pens at home. She takes dose on Wednesdays.  Knox Saliva, PharmD, MPH, BCPS Clinical Pharmacist (Rheumatology and Pulmonology)

## 2021-12-13 NOTE — Patient Instructions (Addendum)
I recommend you can try tapering the prednisone dose more gradually to 7.5 mg per day if you feel symptoms are not too much worse.  I updated the hydroxyzine prescription to account for taking 1-2 times daily as needed.  If we do not see any additional response to [facility] continuing this for the next few months or able to tolerate reducing prednisone we might do better on alternative medication. Saphnelo (Anifrolumab) is a newer lupus medication that may work better, this medicine is only available as an infusion.

## 2021-12-14 LAB — CBC WITH DIFFERENTIAL/PLATELET
Absolute Monocytes: 470 cells/uL (ref 200–950)
Basophils Absolute: 31 cells/uL (ref 0–200)
Basophils Relative: 0.4 %
Eosinophils Absolute: 39 cells/uL (ref 15–500)
Eosinophils Relative: 0.5 %
HCT: 40.9 % (ref 35.0–45.0)
Hemoglobin: 13.9 g/dL (ref 11.7–15.5)
Lymphs Abs: 1401 cells/uL (ref 850–3900)
MCH: 31.2 pg (ref 27.0–33.0)
MCHC: 34 g/dL (ref 32.0–36.0)
MCV: 91.7 fL (ref 80.0–100.0)
MPV: 9.3 fL (ref 7.5–12.5)
Monocytes Relative: 6.1 %
Neutro Abs: 5760 cells/uL (ref 1500–7800)
Neutrophils Relative %: 74.8 %
Platelets: 281 10*3/uL (ref 140–400)
RBC: 4.46 10*6/uL (ref 3.80–5.10)
RDW: 12.2 % (ref 11.0–15.0)
Total Lymphocyte: 18.2 %
WBC: 7.7 10*3/uL (ref 3.8–10.8)

## 2021-12-14 LAB — COMPLETE METABOLIC PANEL WITH GFR
AG Ratio: 1.8 (calc) (ref 1.0–2.5)
ALT: 14 U/L (ref 6–29)
AST: 20 U/L (ref 10–30)
Albumin: 4.5 g/dL (ref 3.6–5.1)
Alkaline phosphatase (APISO): 54 U/L (ref 31–125)
BUN: 20 mg/dL (ref 7–25)
CO2: 30 mmol/L (ref 20–32)
Calcium: 9.7 mg/dL (ref 8.6–10.2)
Chloride: 102 mmol/L (ref 98–110)
Creat: 0.91 mg/dL (ref 0.50–0.97)
Globulin: 2.5 g/dL (calc) (ref 1.9–3.7)
Glucose, Bld: 82 mg/dL (ref 65–99)
Potassium: 4.5 mmol/L (ref 3.5–5.3)
Sodium: 138 mmol/L (ref 135–146)
Total Bilirubin: 0.4 mg/dL (ref 0.2–1.2)
Total Protein: 7 g/dL (ref 6.1–8.1)
eGFR: 83 mL/min/{1.73_m2} (ref >=60)

## 2021-12-14 LAB — C3 AND C4
C3 Complement: 127 mg/dL (ref 83–193)
C4 Complement: 22 mg/dL (ref 15–57)

## 2021-12-14 LAB — SEDIMENTATION RATE: Sed Rate: 2 mm/h (ref 0–20)

## 2021-12-14 LAB — ANTI-DNA ANTIBODY, DOUBLE-STRANDED: ds DNA Ab: 17 IU/mL — ABNORMAL HIGH

## 2021-12-15 NOTE — Progress Notes (Signed)
Lab results look fine for continuing her current medications. The dsDNA antibody level is slightly decreasing compared to before starting Benlysta but probably not a significant change.

## 2021-12-16 NOTE — Telephone Encounter (Addendum)
Faxed additional clinical information for reconsideration of Benlysta authorization through [facility]  Fax: [phone] Phone: [phone], ext [phone]  Knox Saliva, PharmD, MPH, BCPS Clinical Pharmacist (Rheumatology and Pulmonology)

## 2021-12-19 ENCOUNTER — Other Ambulatory Visit (HOSPITAL_COMMUNITY): Payer: Self-pay

## 2021-12-19 ENCOUNTER — Encounter: Payer: Self-pay | Admitting: Internal Medicine

## 2021-12-19 NOTE — Telephone Encounter (Signed)
Patient called clinic stating she is going out of town from Wednesday, 12/21/21 through 02/14/22. She states she is frustrated that she called two weeks ago regarding insurance and was not contacted. She states that "if you guys are not going to care about my health then I guess I shouldn't either." I apologized for delay and did review that some plans have more specific requirements for approval. I advised that we submitted authorization shortly after she called with information and had to go back and forth with insurance via fax. Advised that we have submitted information for reconsideration.  Called patient's insurance today for provider courtesy review - set up for 10:45am  Knox Saliva, PharmD, MPH, BCPS Clinical Pharmacist (Rheumatology and Pulmonology)

## 2021-12-19 NOTE — Telephone Encounter (Signed)
Spoke with pharmacist reviewer at [facility]. She states she did not receive most updated chart notes. Refaxed again - she states response will be within 24 hours.  Knox Saliva, PharmD, MPH, BCPS Clinical Pharmacist (Rheumatology and Pulmonology)

## 2021-12-20 ENCOUNTER — Other Ambulatory Visit (HOSPITAL_COMMUNITY): Payer: Self-pay

## 2021-12-20 NOTE — Telephone Encounter (Signed)
Received notification from [facility] regarding a prior authorization for Christy Lowe. Authorization has been APPROVED from 12/02/21 to 12/01/22.  Unable to run test claim as PA has still not entered system. Will have to attempt test claim later. Patient notified via MyChart to pick up sample since she is going out of town  Authorization # [id number]  Knox Saliva, PharmD, MPH, BCPS Clinical Pharmacist (Rheumatology and Pulmonology)

## 2021-12-21 ENCOUNTER — Other Ambulatory Visit (HOSPITAL_COMMUNITY): Payer: Self-pay

## 2021-12-22 NOTE — Telephone Encounter (Signed)
Medication Samples have been provided to the patient.  Drug name: Benlysta       Strength: 200 mg        Qty: 1  LOT: [id number]  Exp.Date: July 2024  Dosing instructions: Inject 200 mg into the skin once weekly.

## 2021-12-25 ENCOUNTER — Other Ambulatory Visit: Payer: Self-pay | Admitting: Internal Medicine

## 2021-12-25 DIAGNOSIS — M359 Systemic involvement of connective tissue, unspecified: Secondary | ICD-10-CM

## 2021-12-26 ENCOUNTER — Other Ambulatory Visit (HOSPITAL_COMMUNITY): Payer: Self-pay

## 2021-12-26 NOTE — Telephone Encounter (Signed)
Patient reached out for update and advised that we are waiting for insurance to enter her active prior auth into the claims system. Advised that I will reach out via MyChart with new pharmacy information and copay card information once claim processes successfully  Knox Saliva, PharmD, MPH, BCPS Clinical Pharmacist (Rheumatology and Pulmonology)

## 2021-12-26 NOTE — Telephone Encounter (Signed)
Attempted to run test claim for patient's prior authorization. Test claim still stating that rx requires prior authorization. Called pharmacy help for assistance - they will have to reach out to [employer] to attach authorization to claim system. She said to re-try claim in 2-3 hours.  Phone: [phone]  Knox Saliva, PharmD, MPH, BCPS Clinical Pharmacist (Rheumatology and Pulmonology)

## 2021-12-27 ENCOUNTER — Other Ambulatory Visit (HOSPITAL_COMMUNITY): Payer: Self-pay

## 2021-12-27 MED ORDER — BENLYSTA 200 MG/ML ~~LOC~~ SOAJ: 200.0000 mg | SUBCUTANEOUS | 0 refills | Status: DC

## 2021-12-27 NOTE — Addendum Note (Signed)
Addended by: Cassandria Anger on: 12/27/2021 10:35 AM   Modules accepted: Orders

## 2021-12-27 NOTE — Telephone Encounter (Signed)
Patient's copay is $2,352.68 for 28 days supply. Patient should already have Benlysta copay card. Patient can fill through [facility].

## 2021-12-27 NOTE — Telephone Encounter (Signed)
Rx for Benlysta sent to [place] since patient is out of state [facility]). She cannot fill with [facility].  MyChart message sent with pharmacy information as well as copay card information per her request since she is driving.  Knox Saliva, PharmD, MPH, BCPS Clinical Pharmacist (Rheumatology and Pulmonology)

## 2022-01-01 ENCOUNTER — Encounter: Payer: Self-pay | Admitting: Internal Medicine

## 2022-01-02 ENCOUNTER — Other Ambulatory Visit: Payer: Self-pay | Admitting: Internal Medicine

## 2022-01-02 DIAGNOSIS — M329 Systemic lupus erythematosus, unspecified: Secondary | ICD-10-CM

## 2022-01-03 NOTE — Telephone Encounter (Signed)
Next Visit: 03/01/2022  Last Visit: 12/13/2021  Last Fill: 12/13/2021  Dx: Systemic lupus erythematosus, unspecified SLE type, unspecified organ involvement status   Current Dose per office note on 12/13/2021: Now back on prednisone 10 mg daily  Okay to refill Prednisone?

## 2022-01-05 ENCOUNTER — Other Ambulatory Visit: Payer: Self-pay | Admitting: Internal Medicine

## 2022-01-05 DIAGNOSIS — L299 Pruritus, unspecified: Secondary | ICD-10-CM

## 2022-01-05 NOTE — Telephone Encounter (Signed)
Patient called stating she hasn't received a response from her Mychart message she left over the weekend.  Patient states her symptoms have increased and now is experiencing tingling in her fingers and numbness in her toes.

## 2022-01-05 NOTE — Telephone Encounter (Signed)
Spoke with Christy Lowe she has increased skin rash, oral ulcers, and numbness in several toes. Joint pain and swelling about the same. I recommended increasing prednisone to 30 mg for 5 days then plan to taper back down to 10 mg if improving else contact us again.

## 2022-01-21 ENCOUNTER — Other Ambulatory Visit: Payer: Self-pay | Admitting: Internal Medicine

## 2022-01-21 DIAGNOSIS — L299 Pruritus, unspecified: Secondary | ICD-10-CM

## 2022-01-21 DIAGNOSIS — M359 Systemic involvement of connective tissue, unspecified: Secondary | ICD-10-CM

## 2022-01-25 ENCOUNTER — Telehealth: Payer: Self-pay | Admitting: Emergency Medicine

## 2022-01-25 NOTE — Telephone Encounter (Signed)
Pt wanting to schedule a vv referral appt, advised pt referral appts are performed in office due to the provider needing to assess pt for the referral ? ?Pt is requesting a c/b to discuss if provider can see her virtually for appt ?

## 2022-01-26 DIAGNOSIS — M25569 Pain in unspecified knee: Secondary | ICD-10-CM

## 2022-01-26 MED ORDER — BENLYSTA 200 MG/ML ~~LOC~~ SOAJ: 200.0000 mg | SUBCUTANEOUS | 0 refills | Status: DC

## 2022-01-26 NOTE — Addendum Note (Signed)
Addended by: Cassandria Anger on: 01/26/2022 08:57 AM ? ? Modules accepted: Orders ? ?

## 2022-01-26 NOTE — Telephone Encounter (Signed)
Sent a my chart message asking what the referral was for. ?

## 2022-01-27 ENCOUNTER — Telehealth: Payer: Self-pay | Admitting: Internal Medicine

## 2022-01-27 ENCOUNTER — Encounter: Payer: Self-pay | Admitting: Internal Medicine

## 2022-01-27 NOTE — Telephone Encounter (Signed)
Pt requesting a referral to [facility] due to problems w/ her knee ?

## 2022-01-27 NOTE — Telephone Encounter (Signed)
Attempted callback left voicemail to return call to office this afternoon or else will send MyChart message response later today.

## 2022-01-27 NOTE — Telephone Encounter (Signed)
Reached out to the patient to reschedule her April 5 appointment. Informed patient that Dr. Benjamine Mola will be out of the office the week of her appointment and wont be back in the office until the middle of April. Patient then got upset and started yelling about how she needs to be seen sooner. Patient stated she isn't doing well on the Prednisone that Dr. Benjamine Mola prescribed and she needs to be seen. Told patient I could send Dr. Benjamine Mola a message to see what she could do. Patient then started yelling that it took 2 weeks for Dr. Benjamine Mola to call her back and that she is not waiting for him to get back to her. Advised patient to reschedule her appointment because Dr. Benjamine Mola will not be in the office and she declined. Patient stated that she will decide on her appointment when he gets back with her. ?

## 2022-01-30 ENCOUNTER — Other Ambulatory Visit: Payer: Self-pay | Admitting: Obstetrics and Gynecology

## 2022-01-30 DIAGNOSIS — N6310 Unspecified lump in the right breast, unspecified quadrant: Secondary | ICD-10-CM

## 2022-01-30 DIAGNOSIS — N6320 Unspecified lump in the left breast, unspecified quadrant: Secondary | ICD-10-CM

## 2022-01-30 NOTE — Telephone Encounter (Signed)
I spoke with Ms. Christy Lowe on 3/3 about her ongoing symptoms mood highly irritable and weight gain since taking the higher dose of prednisone in the past 2 weeks. Noticing more hair falling out and mouth ulcers and skin rashes with skin nodules described as clear filled bumps in several areas. I am not sure about short term alternative options if she does worse with decreased steroid. We discussed saphnelo as a possible alternative to benlysta due to lack of adequate improvement after 6 months. Not possible to start while she is away will review any other baseline labs or information that would be needed to expedite process.

## 2022-01-30 NOTE — Telephone Encounter (Signed)
See my chart message from 01/27/2022. Dr.Rice spoke with patient.  ?

## 2022-01-31 ENCOUNTER — Encounter: Payer: Self-pay | Admitting: Orthopaedic Surgery

## 2022-02-06 ENCOUNTER — Telehealth: Payer: Self-pay | Admitting: Orthopaedic Surgery

## 2022-02-06 NOTE — Telephone Encounter (Signed)
Received vm from pt to call her back. She needs [place] to request her records from another doctor for an upcoming appt with Dr. Ninfa Linden. IC lmvm, advised pt that I understood her vm and to [facility] and leave me detailed msg as to name of office,fax number and dates of service and I can request the records. Also advised that if she had any xray that she will need to bring in the images.pts ph [phone] ?

## 2022-02-13 ENCOUNTER — Telehealth: Payer: Self-pay | Admitting: *Deleted

## 2022-02-13 ENCOUNTER — Telehealth: Payer: Self-pay | Admitting: Pharmacist

## 2022-02-13 NOTE — Telephone Encounter (Signed)
Patient contacted the office stating she increased her Prednisone about 3 weeks ago. Patient states she is taking 30 mg daily. Patient states she is still "swollen and puffy" Patient states she is still hurting. Patient states the [place] is not working. Patient also was following up on Infusion through [facility]. Spoke with [facility] and she is working on it and advise patient. She was advised to answer numbers that she may no recognize as they could be from [facility]. Patient expressed understanding. Patient states she wants to know what can be done and if Lasix would be an option for her to help with the swelling. Patient will reach out to PCP to ask but states that the PCP does not want to treat anything related to the Lupus or side effects from the medication. Please advise.    ?

## 2022-02-13 NOTE — Telephone Encounter (Signed)
[facility] referral form for [employer] faxed with insurance card copy, demographics, and clinicals. ? ?Dose: '300mg'$  IV every 4 weeks ? ?Fax: [phone] ?Phone: [phone] ? ?Knox Saliva, PharmD, MPH, BCPS ?Clinical Pharmacist (Rheumatology and Pulmonology) ?

## 2022-02-14 ENCOUNTER — Other Ambulatory Visit: Payer: Self-pay | Admitting: Internal Medicine

## 2022-02-14 ENCOUNTER — Other Ambulatory Visit: Payer: Self-pay | Admitting: *Deleted

## 2022-02-14 DIAGNOSIS — M329 Systemic lupus erythematosus, unspecified: Secondary | ICD-10-CM

## 2022-02-14 DIAGNOSIS — R768 Other specified abnormal immunological findings in serum: Secondary | ICD-10-CM

## 2022-02-14 DIAGNOSIS — Z79899 Other long term (current) drug therapy: Secondary | ICD-10-CM

## 2022-02-14 DIAGNOSIS — M359 Systemic involvement of connective tissue, unspecified: Secondary | ICD-10-CM

## 2022-02-14 DIAGNOSIS — L299 Pruritus, unspecified: Secondary | ICD-10-CM

## 2022-02-14 DIAGNOSIS — M797 Fibromyalgia: Secondary | ICD-10-CM

## 2022-02-14 NOTE — Telephone Encounter (Signed)
I called patient regarding Saphnelo infusion. I apologized for delay in placing referral. I advised that there was some miscommunication from our end that [facility] referral wouldn't be placed until Dr. Benjamine Mola talked to her in detail about it. I advised that referral that was placed yesterday was urgent. ? ?I also advised that if she was dissatisfied with care provided we can place referral for her to be seen elsewhere like Dr. Lajoyce Noon office. She states she has tried and been told that the earliest that they could get her in is in July and she can't be expected to "survive until July." I reviewed that we can try to place urgent referral but cannot guarentee quicker appointment. She states she'd be fine with referral be placed. ? ?She states that she has gained 20 pounds in 2 weeks while on prednisone '30mg'$  and her life is miserable. She states she is home for 3 weeks now and will be out of town for 3 weeks. She is aware that [facility] has different infusion centers. She states that none of this is convenient to her life but she has to live with it and changes. ? ?I again apologized and advised we will place urgent referral to Dr. Diamonique Noon office and that [facility] should process her referral in 1-2 weeks. ? ?Knox Saliva, PharmD, MPH, BCPS ?Clinical Pharmacist (Rheumatology and Pulmonology) ?

## 2022-02-15 NOTE — Telephone Encounter (Signed)
Next Visit: 03/06/2022 ? ?Last Visit: 12/13/2021 ? ?Last Fill: 01/23/2022 ? ?Dx: Itching ? ?Current Dose per office note on 12/13/2021: hydrOXYzine (ATARAX) 25 MG tablet ? ?Okay to refill Hydroxyzine?   ?

## 2022-02-16 ENCOUNTER — Ambulatory Visit: Payer: BC Managed Care – PPO | Admitting: Emergency Medicine

## 2022-02-16 ENCOUNTER — Telehealth: Payer: Self-pay

## 2022-02-16 ENCOUNTER — Encounter: Payer: Self-pay | Admitting: Obstetrics and Gynecology

## 2022-02-16 DIAGNOSIS — R110 Nausea: Secondary | ICD-10-CM | POA: Diagnosis not present

## 2022-02-16 DIAGNOSIS — Z880 Allergy status to penicillin: Secondary | ICD-10-CM | POA: Diagnosis not present

## 2022-02-16 DIAGNOSIS — Z7969 Long term (current) use of other immunomodulators and immunosuppressants: Secondary | ICD-10-CM | POA: Diagnosis not present

## 2022-02-16 DIAGNOSIS — Z882 Allergy status to sulfonamides status: Secondary | ICD-10-CM | POA: Diagnosis not present

## 2022-02-16 DIAGNOSIS — Z90710 Acquired absence of both cervix and uterus: Secondary | ICD-10-CM | POA: Diagnosis not present

## 2022-02-16 DIAGNOSIS — Z7952 Long term (current) use of systemic steroids: Secondary | ICD-10-CM | POA: Diagnosis not present

## 2022-02-16 DIAGNOSIS — D84821 Immunodeficiency due to drugs: Secondary | ICD-10-CM | POA: Diagnosis not present

## 2022-02-16 DIAGNOSIS — Z881 Allergy status to other antibiotic agents status: Secondary | ICD-10-CM | POA: Diagnosis not present

## 2022-02-16 DIAGNOSIS — D849 Immunodeficiency, unspecified: Secondary | ICD-10-CM | POA: Diagnosis not present

## 2022-02-16 DIAGNOSIS — N939 Abnormal uterine and vaginal bleeding, unspecified: Secondary | ICD-10-CM | POA: Diagnosis not present

## 2022-02-16 DIAGNOSIS — R1030 Lower abdominal pain, unspecified: Secondary | ICD-10-CM | POA: Diagnosis not present

## 2022-02-16 NOTE — Telephone Encounter (Signed)
I spoke with Christy Lowe. CT scan showed fluid on top of cuff?  She said pelvic exam showed redness and a little blood but no evidence of a tear.  She said it said questionable ov cyst because of the fluid.  MD wanted to put her on antibiotic but she told him she cannot take antibiotics.  He is discharging her to follow up with Dr. Talbert Nan.  ? ?Patient will not be back in town until Saturday. Scheduled her to see Dr. Talbert Nan 11am on Monday Morning. She opted for that time because it was close to her two afternoon appointments she has scheduled elsewhere. ?

## 2022-02-16 NOTE — Telephone Encounter (Signed)
Patient called to update. She is ER in [place], [place].  She said she expressed to the ER physician what. Dr. Gentry Fitz concern was and he told her that he had been practicing for 20 years and never seen such.  He had ordered a CT scan and patient said she is going to make sure someone performs a pelvic exam before she is discharged.  She said her whole lower abdomen is painful anywhere she presses.  ? ?She said she will keep [place] updated. I gave her my private line to use.  ?

## 2022-02-16 NOTE — Telephone Encounter (Signed)
I called patient and relayed to her that Dr. Talbert Nan would like her to come now to the office to be seen. However, patient is almost 3 hours away in [place], [place].  I recommended, per Dr. Talbert Nan, that she go to the closest ER, which she said is 30 miles away.  Patient kept considering driving herself to our office and I stressed to her that Dr. Talbert Nan did not want her driving herself since she is feeling like she might pass out. I also stressed to her that Dr. Talbert Nan is very concerned that she may have opening at top of her vagina and it could be very serious and needs attention asap.  She is going to try to find someone to drive her and she will go to the ER asap. ?

## 2022-02-16 NOTE — Telephone Encounter (Signed)
She needs to come in now.  ?

## 2022-02-16 NOTE — Telephone Encounter (Signed)
Pt also called and left VM on triage line this AM.  ?

## 2022-02-17 NOTE — Telephone Encounter (Signed)
Received fax from [facility] regarding whether patient's [facility] infusion is inpatient hospital or outpatient hospital infusion center. [facility] is neither.  Called [facility] case manager for her advice. She states she hadn't inquired but will look into it and call me back. Provided with direct office number ? ?Knox Saliva, PharmD, MPH, BCPS ?Clinical Pharmacist (Rheumatology and Pulmonology) ?

## 2022-02-20 ENCOUNTER — Ambulatory Visit (INDEPENDENT_AMBULATORY_CARE_PROVIDER_SITE_OTHER): Payer: BC Managed Care – PPO | Admitting: Emergency Medicine

## 2022-02-20 ENCOUNTER — Encounter: Payer: Self-pay | Admitting: Emergency Medicine

## 2022-02-20 ENCOUNTER — Encounter: Payer: Self-pay | Admitting: Orthopaedic Surgery

## 2022-02-20 ENCOUNTER — Other Ambulatory Visit: Payer: Self-pay

## 2022-02-20 ENCOUNTER — Telehealth: Payer: Self-pay | Admitting: *Deleted

## 2022-02-20 ENCOUNTER — Other Ambulatory Visit: Payer: BC Managed Care – PPO

## 2022-02-20 ENCOUNTER — Encounter: Payer: Self-pay | Admitting: Obstetrics and Gynecology

## 2022-02-20 ENCOUNTER — Ambulatory Visit (INDEPENDENT_AMBULATORY_CARE_PROVIDER_SITE_OTHER): Payer: BC Managed Care – PPO

## 2022-02-20 ENCOUNTER — Ambulatory Visit (INDEPENDENT_AMBULATORY_CARE_PROVIDER_SITE_OTHER): Payer: BC Managed Care – PPO | Admitting: Obstetrics and Gynecology

## 2022-02-20 ENCOUNTER — Ambulatory Visit: Payer: BC Managed Care – PPO | Admitting: Orthopaedic Surgery

## 2022-02-20 VITALS — BP 120/86 | HR 107 | Temp 98.4°F | Ht 67.0 in | Wt 198.1 lb

## 2022-02-20 VITALS — BP 128/80 | Temp 98.3°F | Resp 14 | Ht 67.0 in | Wt 197.0 lb

## 2022-02-20 DIAGNOSIS — M329 Systemic lupus erythematosus, unspecified: Secondary | ICD-10-CM | POA: Diagnosis not present

## 2022-02-20 DIAGNOSIS — T887XXA Unspecified adverse effect of drug or medicament, initial encounter: Secondary | ICD-10-CM | POA: Insufficient documentation

## 2022-02-20 DIAGNOSIS — M25562 Pain in left knee: Secondary | ICD-10-CM | POA: Diagnosis not present

## 2022-02-20 DIAGNOSIS — K1379 Other lesions of oral mucosa: Secondary | ICD-10-CM

## 2022-02-20 DIAGNOSIS — R635 Abnormal weight gain: Secondary | ICD-10-CM | POA: Diagnosis not present

## 2022-02-20 DIAGNOSIS — Z9889 Other specified postprocedural states: Secondary | ICD-10-CM

## 2022-02-20 DIAGNOSIS — G8929 Other chronic pain: Secondary | ICD-10-CM

## 2022-02-20 DIAGNOSIS — Z7952 Long term (current) use of systemic steroids: Secondary | ICD-10-CM

## 2022-02-20 DIAGNOSIS — F418 Other specified anxiety disorders: Secondary | ICD-10-CM | POA: Diagnosis not present

## 2022-02-20 DIAGNOSIS — T8131XD Disruption of external operation (surgical) wound, not elsewhere classified, subsequent encounter: Secondary | ICD-10-CM | POA: Diagnosis not present

## 2022-02-20 DIAGNOSIS — R61 Generalized hyperhidrosis: Secondary | ICD-10-CM

## 2022-02-20 DIAGNOSIS — Z90710 Acquired absence of both cervix and uterus: Secondary | ICD-10-CM | POA: Diagnosis not present

## 2022-02-20 HISTORY — DX: Long term (current) use of systemic steroids: Z79.52

## 2022-02-20 MED ORDER — DOXYCYCLINE HYCLATE 100 MG PO CAPS: 100.0000 mg | ORAL_CAPSULE | Freq: Two times a day (BID) | ORAL | 0 refills | Status: DC

## 2022-02-20 MED ORDER — LORAZEPAM 1 MG PO TABS: ORAL_TABLET | ORAL | 1 refills | Status: DC

## 2022-02-20 MED ORDER — METRONIDAZOLE 500 MG PO TABS: 500.0000 mg | ORAL_TABLET | Freq: Two times a day (BID) | ORAL | 0 refills | Status: DC

## 2022-02-20 NOTE — Progress Notes (Signed)
See Dr Jertson's note 

## 2022-02-20 NOTE — Assessment & Plan Note (Signed)
Despite signs and symptoms of excess corticosteroid use. ?Recommend to decrease dose of prednisone to 20 mg for 1 to 2 weeks. ?Goal should be 10 mg of prednisone daily or less. ?Needs to follow-up with rheumatologist for further evaluation and treatment. ?

## 2022-02-20 NOTE — Progress Notes (Signed)
The patient is a very pleasant 41 year old female who has a history of a left knee ACL reconstruction and medial meniscal repair that was performed in [place] in 2019.  She has had no issues with her left knee until about 2 weeks ago when she went to get on a horse and she felt and heard a loud pop in her left knee.  She has had problems with hyperextending of her knee since then and swelling and pain in the back of her knee with weightbearing.  She is already on prednisone 30 mg daily for lupus and that has not really helped with the inflammation in her left knee.  She denies any other acute change in her medical status. ? ?Examination of her left knee shows a mild effusion.  There is a significant positive McMurray's exam to the lateral compartment of her knee on that left side.  There is some slight laxity in the ligaments when comparing the left and right knee. ? ?2 views of the left knee show no acute findings.  There are interference screws from previous ACL reconstruction. ? ?Given the locking and catching with her left knee combined with her mechanical symptoms and instability of the left knee, a MRI of the left knee is warranted to rule out a new meniscal tear and to assess her ACL reconstruction.  She will continue her prednisone per her rheumatologist for lupus.  We will see her back after the MRI of her left knee. ?

## 2022-02-20 NOTE — Patient Instructions (Addendum)
Decrease prednisone to 20 mg daily for the next 1 to 2 weeks ?Ativan twice a day for 2 to 3 days and then as needed ? ?Health Maintenance, Female ?Adopting a healthy lifestyle and getting preventive care are important in promoting health and wellness. Ask your health care provider about: ?The right schedule for you to have regular tests and exams. ?Things you can do on your own to prevent diseases and keep yourself healthy. ?What should I know about diet, weight, and exercise? ?Eat a healthy diet ? ?Eat a diet that includes plenty of vegetables, fruits, low-fat dairy products, and lean protein. ?Do not eat a lot of foods that are high in solid fats, added sugars, or sodium. ?Maintain a healthy weight ?Body mass index (BMI) is used to identify weight problems. It estimates body fat based on height and weight. Your health care provider can help determine your BMI and help you achieve or maintain a healthy weight. ?Get regular exercise ?Get regular exercise. This is one of the most important things you can do for your health. Most adults should: ?Exercise for at least 150 minutes each week. The exercise should increase your heart rate and make you sweat (moderate-intensity exercise). ?Do strengthening exercises at least twice a week. This is in addition to the moderate-intensity exercise. ?Spend less time sitting. Even light physical activity can be beneficial. ?Watch cholesterol and blood lipids ?Have your blood tested for lipids and cholesterol at 39 years of age, then have this test every 5 years. ?Have your cholesterol levels checked more often if: ?Your lipid or cholesterol levels are high. ?You are older than 39 years of age. ?You are at high risk for heart disease. ?What should I know about cancer screening? ?Depending on your health history and family history, you may need to have cancer screening at various ages. This may include screening for: ?Breast cancer. ?Cervical cancer. ?Colorectal cancer. ?Skin  cancer. ?Lung cancer. ?What should I know about heart disease, diabetes, and high blood pressure? ?Blood pressure and heart disease ?High blood pressure causes heart disease and increases the risk of stroke. This is more likely to develop in people who have high blood pressure readings or are overweight. ?Have your blood pressure checked: ?Every 3-5 years if you are 25-60 years of age. ?Every year if you are 60 years old or older. ?Diabetes ?Have regular diabetes screenings. This checks your fasting blood sugar level. Have the screening done: ?Once every three years after age 37 if you are at a normal weight and have a low risk for diabetes. ?More often and at a younger age if you are overweight or have a high risk for diabetes. ?What should I know about preventing infection? ?Hepatitis B ?If you have a higher risk for hepatitis B, you should be screened for this virus. Talk with your health care provider to find out if you are at risk for hepatitis B infection. ?Hepatitis C ?Testing is recommended for: ?Everyone born from [birth year] through 1963. ?Anyone with known risk factors for hepatitis C. ?Sexually transmitted infections (STIs) ?Get screened for STIs, including gonorrhea and chlamydia, if: ?You are sexually active and are younger than 39 years of age. ?You are older than 39 years of age and your health care provider tells you that you are at risk for this type of infection. ?Your sexual activity has changed since you were last screened, and you are at increased risk for chlamydia or gonorrhea. Ask your health care provider if you are at risk. ?  Ask your health care provider about whether you are at high risk for HIV. Your health care provider may recommend a prescription medicine to help prevent HIV infection. If you choose to take medicine to prevent HIV, you should first get tested for HIV. You should then be tested every 3 months for as long as you are taking the medicine. ?Pregnancy ?If you are about to stop  having your period (premenopausal) and you may become pregnant, seek counseling before you get pregnant. ?Take 400 to 800 micrograms (mcg) of folic acid every day if you become pregnant. ?Ask for birth control (contraception) if you want to prevent pregnancy. ?Osteoporosis and menopause ?Osteoporosis is a disease in which the bones lose minerals and strength with aging. This can result in bone fractures. If you are 19 years old or older, or if you are at risk for osteoporosis and fractures, ask your health care provider if you should: ?Be screened for bone loss. ?Take a calcium or vitamin D supplement to lower your risk of fractures. ?Be given hormone replacement therapy (HRT) to treat symptoms of menopause. ?Follow these instructions at home: ?Alcohol use ?Do not drink alcohol if: ?Your health care provider tells you not to drink. ?You are pregnant, may be pregnant, or are planning to become pregnant. ?If you drink alcohol: ?Limit how much you have to: ?0-1 drink a day. ?Know how much alcohol is in your drink. In the [place] one drink equals one 12 oz bottle of beer (355 mL), one 5 oz glass of wine (148 mL), or one 1? oz glass of hard liquor (44 mL). ?Lifestyle ?Do not use any products that contain nicotine or tobacco. These products include cigarettes, chewing tobacco, and vaping devices, such as e-cigarettes. If you need help quitting, ask your health care provider. ?Do not use Buikema drugs. ?Do not share needles. ?Ask your health care provider for help if you need support or information about quitting drugs. ?General instructions ?Schedule regular health, dental, and eye exams. ?Stay current with your vaccines. ?Tell your health care provider if: ?You often feel depressed. ?You have ever been abused or do not feel safe at home. ?Summary ?Adopting a healthy lifestyle and getting preventive care are important in promoting health and wellness. ?Follow your health care provider's instructions about healthy diet,  exercising, and getting tested or screened for diseases. ?Follow your health care provider's instructions on monitoring your cholesterol and blood pressure. ?This information is not intended to replace advice given to you by your health care provider. Make sure you discuss any questions you have with your health care provider. ?Document Revised: 04/04/2021 Document Reviewed: 04/04/2021 ?Elsevier Patient Education ? 2022 [place]. ? ? ?

## 2022-02-20 NOTE — Progress Notes (Signed)
GYNECOLOGY  VISIT ?  ?HPI: ?41 y.o.   Single White or Caucasian Not Hispanic or Latino  female   ?[id number] with Patient's last menstrual period was 10/10/2021.   ?here for f/u. She is s/p TLH in 12/22. She was seen in the ER in [place] last week with pain and bleeding after intercourse. She was noted to have some free fluid in her pelvis on CT and some vaginal redness and a little blood, but no evidence of a tear. They sent her home with pain medication (but she couldn't get them secondary to backorder). She has been using motrin. ?On Thursday, her pain was a 10/10 in her entire lower abdomen. Today her pain is a 1/10 in severity. No fevers.  ? ?She is on high dose prednisone. Her Rheumatologist is on paternity leave for a month, can't get into see another Rheumatologist. ? ?She has gained 15 lbs in 1.5 weeks on the prednisone. She was on 35 mg a day, she weaned herself to 25 weeks.  ? ?Since her surgery in 12/22 she feels hot all the time. No hot flashes. Some night sweats.  ? ?GYNECOLOGIC HISTORY: ?Patient's last menstrual period was 10/10/2021. ?Contraception: hysterectomy ?Menopausal hormone therapy: no ?       ?OB History   ? ? Gravida  ?0  ? Para  ?0  ? Term  ?0  ? Preterm  ?0  ? AB  ?0  ? Living  ?0  ?  ? ? SAB  ?0  ? IAB  ?0  ? Ectopic  ?0  ? Multiple  ?0  ? Live Births  ?0  ?   ?  ?  ?    ? ?Patient Active Problem List  ? Diagnosis Date Noted  ? S/P laparoscopic hysterectomy 10/31/2021  ? Itching 09/09/2021  ? High risk medication use 07/25/2021  ? Finger joint swelling, left 04/01/2021  ? Systemic lupus ([place]) 04/01/2021  ? Fibromyalgia 04/01/2021  ? Alopecia 11/17/2020  ? Other fatigue 10/12/2020  ? Migraine without aura   ? History of DVT (deep vein thrombosis)   ? History of endometriosis   ? Right-sided muscle weakness 05/15/2019  ? Abnormal finding on MRI of brain 05/15/2019  ? 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 Tremor 12/24/2017  ? ? ?Past Medical History:  ?Diagnosis Date  ? aub   ? Bruises easily  10/24/2021  ? Complication of anesthesia 10/15/2020  ? hypotension  ? Fibromyalgia   ? GERD (gastroesophageal reflux disease)   ? Heart murmur  PFO   ? off 81 mg aspirin since feb 2022 per pcp  no cardiologist  ? Hemangioma   ? History of DVT (deep vein thrombosis)   ? right le 7 yrs ago, right leg slight swollen  ? History of endometriosis   ? History of hiatal hernia   ? Hypotension   ? with 10-15-2020 endoscopy  ? Migraine without aura   ? Systemic lupus erythematosus ([place])   ? TIA   ? ages 18 and 81  ? Viral meningitis   ? age 20  ? ? ?Past Surgical History:  ?Procedure Laterality Date  ? ANTERIOR CRUCIATE LIGAMENT REPAIR Left 10/2018  ? CYSTOSCOPY  10/31/2021  ? Procedure: CYSTOSCOPY;  Surgeon: Salvadore Dom, MD;  Location: [facility];  Service: Gynecology;;  ? PELVIC LAPAROSCOPY    ? for endometriosis yrs ago per pt on 10-24-2021  ? right knee lateral release and meniscal repair  2011  ?  TONSILLECTOMY    ? age 50, adenoids removed also  ? TOTAL LAPAROSCOPIC HYSTERECTOMY WITH SALPINGECTOMY Bilateral 10/31/2021  ? Procedure: TOTAL LAPAROSCOPIC HYSTERECTOMY WITH SALPINGECTOMY;  Surgeon: Salvadore Dom, MD;  Location: [facility];  Service: Gynecology;  Laterality: Bilateral;  ? UMBILICAL HERNIA REPAIR    ? yrs ago  ? UPPER GI ENDOSCOPY    ? with esophagus stretching 10-15-2020, 03-11-2020, 05-09-2019,  2013 with being polyp removed  ? ? ?Current Outpatient Medications  ?Medication Sig Dispense Refill  ? Belimumab (BENLYSTA) 200 MG/ML SOAJ Inject 200 mg into the skin once a week. 8 mL 0  ? chlorhexidine (PERIDEX) 0.12 % solution as needed.    ? cyclobenzaprine (FLEXERIL) 10 MG tablet TAKE 1 TABLET BY MOUTH AT BEDTIME AS NEEDED FOR MUSCLE SPASMS. 30 tablet 1  ? hydroxychloroquine (PLAQUENIL) 200 MG tablet Take 2 tablets (400 mg total) by mouth daily. 180 tablet 1  ? hydrOXYzine (ATARAX) 25 MG tablet TAKE AT NIGHT OR UP TO 2 TIMES DAILY AS NEEDED FOR ITCHING 180 tablet 1  ?  predniSONE (DELTASONE) 10 MG tablet Take 1 tablet (10 mg total) by mouth daily with breakfast. 30 tablet 1  ? sucralfate (CARAFATE) 1 GM/10ML suspension Take 10 mLs (1 g total) by mouth 4 (four) times daily. (Patient taking differently: Take 1 g by mouth as needed.) 420 mL 1  ? VITAMIN D PO Take 1,000 Units by mouth daily.    ? ?No current facility-administered medications for this visit.  ?  ? ?ALLERGIES: Cephalexin, Penicillins, Amitiza [lubiprostone], and Sulfa antibiotics ? ?Family History  ?Problem Relation Age of Onset  ? Alcohol abuse Mother   ? Cancer Father   ?     Lung and Colon  ? Heart attack Father   ? COPD Father   ? Diabetes Maternal Grandmother   ? Cervical cancer Maternal Grandmother   ? Fibromyalgia Maternal Grandmother   ? Prostate cancer Maternal Grandfather   ? Heart attack Maternal Grandfather   ? ALS Paternal Grandmother   ? Prostate cancer Paternal Grandfather   ? Heart attack Paternal Grandfather   ? Esophageal cancer Paternal Grandfather   ? Breast cancer Other   ? Colon cancer Neg Hx   ? Stomach cancer Neg Hx   ? Rectal cancer Neg Hx   ? ? ?Social History  ? ?Socioeconomic History  ? Marital status: Single  ?  Spouse name: Not on file  ? Number of children: Not on file  ? Years of education: Not on file  ? Highest education level: Not on file  ?Occupational History  ? Not on file  ?Tobacco Use  ? Smoking status: Never  ? Smokeless tobacco: Never  ?Vaping Use  ? Vaping Use: Never used  ?Substance and Sexual Activity  ? Alcohol use: Not Currently  ? Drug use: Never  ? Sexual activity: Yes  ?  Birth control/protection: Surgical  ?  Comment: partner with vasectomy  ?Other Topics Concern  ? Not on file  ?Social History Narrative  ? Not on file  ? ?Social Determinants of Health  ? ?Financial Resource Strain: Not on file  ?Food Insecurity: Not on file  ?Transportation Needs: Not on file  ?Physical Activity: Not on file  ?Stress: Not on file  ?Social Connections: Not on file  ?Intimate Partner  Violence: Not on file  ? ? ?ROS ? ?PHYSICAL EXAMINATION:   ? ?BP 128/80 (BP Location: Left Arm, Patient Position: Sitting, Cuff Size: Large)  Wt 197 lb (89.4 kg)   LMP 10/10/2021   BMI 30.85 kg/m?     ?General appearance: alert, cooperative and appears stated age ?Abdomen: soft, mildly tender in the suprapubic area, no rebound; non distended, no masses,  no organomegaly ? ?Pelvic: External genitalia:  no lesions ?             Urethra:  normal appearing urethra with no masses, tenderness or lesions ?             Bartholins and Skenes: normal    ?             Vagina: normal appearing vagina with normal color and discharge, no lesions ?             Cervix: absent ?             Bimanual Exam:  Uterus:  uterus absent ?             Adnexa:  no masses. ?             Vaginal cuff: there is a less than one cm opening at the center of her vaginal apex. Not able to pass a cotton swab or finger into the opening (with gentle palpation), no erythema, no drainage, no bleeding. The cuff is tender to palpation (improved per the patient).  ? ?Chaperone was present for exam. ? ?1. Vaginal cuff dehiscence, subsequent encounter ?She was seen in the ER in [place] 4 days ago. They didn't identify a dehiscence at that time. I believe that she did have a small dehiscence with intercourse and is healing on her own. The opening is very small, her pain has markedly improved. She is on long term steroids which I think has impaired her healing process. She doesn't appear infected, but I think the safest option is to treat with antibiotics. She has a PCN and Cephalosporin allergies.  ?- metroNIDAZOLE (FLAGYL) 500 MG tablet; Take 1 tablet (500 mg total) by mouth 2 (two) times daily.  Dispense: 14 tablet; Refill: 0 ?- doxycycline (VIBRAMYCIN) 100 MG capsule; Take 1 capsule (100 mg total) by mouth 2 (two) times daily. Take BID for 7 days.  Take with food as can cause GI distress.  Dispense: 14 capsule; Refill: 0 ?-F/U in one week ?-Call with any  concerns ?-Pelvic rest ? ?2. Current chronic use of systemic steroids ?She is weaning down ? ?3. Night sweats ?Since surgery, she still has her ovaries ?- TSH ?- Follicle stimulating hormone ? ?4. Weight gain ?- TSH ?

## 2022-02-20 NOTE — Telephone Encounter (Signed)
Please call patient ASAP. [phone], patient came into office, patient states she is taking 25 mg prednisone daily, patient is having tremors, pale skin, swollen bil hands, weight gain, blue toes, red skin on chest, depression, anxiety, lump on left thigh, mouth sores, mouth bleeding, lips bleeding, joints making cracking noises, blurred eyes, dry mouth, sores on neck, chest, face, increased blood pressure 132/82, hair loss, mass on left side of neck, patient has appt with PCP 02/20/2022, urgent referral placed for Dr. Amil Amen. ?

## 2022-02-20 NOTE — Assessment & Plan Note (Signed)
Shakiness and nervousness most likely related to prednisone use.  However some degree of situational anxiety is prevalent.  Will benefit from Ativan 1 mg twice a day as needed. ?

## 2022-02-20 NOTE — Telephone Encounter (Signed)
Called [facility] regarding patient's Christy Lowe since did not receive return call from case manager on Friday, 02/17/22. She states she submitted questions via CMM and successfully went through ? ?Faxed form to [place] to prevent delay just in case there was technically error in [facility] submission ?Fax: [phone] ?Phone; [phone], option 3, option 4, option 1 ? ?Knox Saliva, PharmD, MPH, BCPS ?Clinical Pharmacist (Rheumatology and Pulmonology) ?

## 2022-02-20 NOTE — Progress Notes (Signed)
Christy Lowe ?41 y.o. ? ? ?Chief Complaint  ?Patient presents with  ? Lupus concerns  ?  Concerns aboutt Lupus medications and symptoms   ? ? ?HISTORY OF PRESENT ILLNESS: ?This is a 41 y.o. female with history of systemic lupus on multiple medications with a number of complaints today.  Rheumatologist on paternity leave and not able to maintain proper communication or proper continuation of treatment.  Presently on 30 mg of prednisone which is causing multiple side effects including but not limited to the following: Weight gain, bruises on legs, "on edge" and moody, mouth sores, feeling shaky, hands and joint swelling, sunburn like rash.  This is creating extreme anxiety affecting her quality of life and daily performance. ? ?HPI ? ? ?Prior to Admission medications   ?Medication Sig Start Date End Date Taking? Authorizing Provider  ?Belimumab (BENLYSTA) 200 MG/ML SOAJ Inject 200 mg into the skin once a week. 01/26/22  Yes Rice, Resa Miner, MD  ?chlorhexidine (PERIDEX) 0.12 % solution as needed. 03/29/21  Yes [provider]  ?cyclobenzaprine (FLEXERIL) 10 MG tablet TAKE 1 TABLET BY MOUTH AT BEDTIME AS NEEDED FOR MUSCLE SPASMS. 11/29/21  Yes Rice, Resa Miner, MD  ?doxycycline (VIBRAMYCIN) 100 MG capsule Take 1 capsule (100 mg total) by mouth 2 (two) times daily. Take BID for 7 days.  Take with food as can cause GI distress. 02/20/22  Yes Salvadore Dom, MD  ?hydroxychloroquine (PLAQUENIL) 200 MG tablet Take 2 tablets (400 mg total) by mouth daily. 12/13/21  Yes Rice, Resa Miner, MD  ?hydrOXYzine (ATARAX) 25 MG tablet TAKE AT NIGHT OR UP TO 2 TIMES DAILY AS NEEDED FOR ITCHING 02/15/22  Yes Rice, Resa Miner, MD  ?LORazepam (ATIVAN) 1 MG tablet Take 1 tablet twice a day for 3 days and then twice a day as needed 02/20/22  Yes Daphyne Miguez, Ines Bloomer, MD  ?metroNIDAZOLE (FLAGYL) 500 MG tablet Take 1 tablet (500 mg total) by mouth 2 (two) times daily. 02/20/22  Yes Salvadore Dom, MD  ?predniSONE  (DELTASONE) 10 MG tablet Take 1 tablet (10 mg total) by mouth daily with breakfast. 01/23/22  Yes Rice, Resa Miner, MD  ?sucralfate (CARAFATE) 1 GM/10ML suspension Take 10 mLs (1 g total) by mouth 4 (four) times daily. ?Patient taking differently: Take 1 g by mouth as needed. 05/17/20  Yes Nandigam, Venia Minks, MD  ?VITAMIN D PO Take 1,000 Units by mouth daily.   Yes [provider]  ? ? ?Allergies  ?Allergen Reactions  ? Cephalexin Hives and Shortness Of Breath  ? Penicillins Hives and Anaphylaxis  ?  SOB ?As child ?  ? Amitiza [Lubiprostone]   ?  N/v  ? Sulfa Antibiotics Other (See Comments)  ?  As child ?Doesn't remember ?  ? ? ?Patient Active Problem List  ? Diagnosis Date Noted  ? S/P laparoscopic hysterectomy 10/31/2021  ? Itching 09/09/2021  ? High risk medication use 07/25/2021  ? Finger joint swelling, left 04/01/2021  ? Systemic lupus ([place]) 04/01/2021  ? Fibromyalgia 04/01/2021  ? Alopecia 11/17/2020  ? Other fatigue 10/12/2020  ? Migraine without aura   ? History of DVT (deep vein thrombosis)   ? History of endometriosis   ? Right-sided muscle weakness 05/15/2019  ? Abnormal finding on MRI of brain 05/15/2019  ? 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 Tremor 12/24/2017  ? ? ?Past Medical History:  ?Diagnosis Date  ? aub   ? Bruises easily 10/24/2021  ? Complication of anesthesia 10/15/2020  ?  hypotension  ? Fibromyalgia   ? GERD (gastroesophageal reflux disease)   ? Heart murmur  PFO   ? off 81 mg aspirin since feb 2022 per pcp  no cardiologist  ? Hemangioma   ? History of DVT (deep vein thrombosis)   ? right le 7 yrs ago, right leg slight swollen  ? History of endometriosis   ? History of hiatal hernia   ? Hypotension   ? with 10-15-2020 endoscopy  ? Migraine without aura   ? Systemic lupus erythematosus ([place])   ? TIA   ? ages 79 and 16  ? Viral meningitis   ? age 27  ? ? ?Past Surgical History:  ?Procedure Laterality Date  ? ANTERIOR CRUCIATE LIGAMENT REPAIR Left 10/2018  ? CYSTOSCOPY  10/31/2021   ? Procedure: CYSTOSCOPY;  Surgeon: Salvadore Dom, MD;  Location: [facility];  Service: Gynecology;;  ? PELVIC LAPAROSCOPY    ? for endometriosis yrs ago per pt on 10-24-2021  ? right knee lateral release and meniscal repair  2011  ? TONSILLECTOMY    ? age 69, adenoids removed also  ? TOTAL LAPAROSCOPIC HYSTERECTOMY WITH SALPINGECTOMY Bilateral 10/31/2021  ? Procedure: TOTAL LAPAROSCOPIC HYSTERECTOMY WITH SALPINGECTOMY;  Surgeon: Salvadore Dom, MD;  Location: [facility];  Service: Gynecology;  Laterality: Bilateral;  ? UMBILICAL HERNIA REPAIR    ? yrs ago  ? UPPER GI ENDOSCOPY    ? with esophagus stretching 10-15-2020, 03-11-2020, 05-09-2019,  2013 with being polyp removed  ? ? ?Social History  ? ?Socioeconomic History  ? Marital status: Single  ?  Spouse name: Not on file  ? Number of children: Not on file  ? Years of education: Not on file  ? Highest education level: Not on file  ?Occupational History  ? Not on file  ?Tobacco Use  ? Smoking status: Never  ? Smokeless tobacco: Never  ?Vaping Use  ? Vaping Use: Never used  ?Substance and Sexual Activity  ? Alcohol use: Not Currently  ? Drug use: Never  ? Sexual activity: Yes  ?  Birth control/protection: Surgical  ?  Comment: partner with vasectomy  ?Other Topics Concern  ? Not on file  ?Social History Narrative  ? Not on file  ? ?Social Determinants of Health  ? ?Financial Resource Strain: Not on file  ?Food Insecurity: Not on file  ?Transportation Needs: Not on file  ?Physical Activity: Not on file  ?Stress: Not on file  ?Social Connections: Not on file  ?Intimate Partner Violence: Not on file  ? ? ?Family History  ?Problem Relation Age of Onset  ? Alcohol abuse Mother   ? Cancer Father   ?     Lung and Colon  ? Heart attack Father   ? COPD Father   ? Diabetes Maternal Grandmother   ? Cervical cancer Maternal Grandmother   ? Fibromyalgia Maternal Grandmother   ? Prostate cancer Maternal Grandfather   ? Heart attack  Maternal Grandfather   ? ALS Paternal Grandmother   ? Prostate cancer Paternal Grandfather   ? Heart attack Paternal Grandfather   ? Esophageal cancer Paternal Grandfather   ? Breast cancer Other   ? Colon cancer Neg Hx   ? Stomach cancer Neg Hx   ? Rectal cancer Neg Hx   ? ? ? ?Review of Systems  ?Constitutional: Negative.  Negative for chills and fever.  ?     Weight gain  ?HENT:  Negative for congestion and sore  throat.   ?Respiratory: Negative.  Negative for cough and shortness of breath.   ?Cardiovascular:  Negative for chest pain and palpitations.  ?Gastrointestinal:  Negative for abdominal pain, diarrhea, nausea and vomiting.  ?Genitourinary: Negative.  Negative for dysuria and hematuria.  ?Musculoskeletal:  Positive for joint pain.  ?Skin:  Positive for rash.  ?Neurological:  Negative for dizziness and headaches.  ?Psychiatric/Behavioral:  The patient is nervous/anxious.   ?All other systems reviewed and are negative. ? ?Today's Vitals  ? 02/20/22 1605  ?BP: 120/86  ?Pulse: (!) 107  ?Temp: 98.4 ?F (36.9 ?C)  ?TempSrc: Oral  ?SpO2: 97%  ?Weight: 198 lb 2 oz (89.9 kg)  ?Height: '5\' 7"'$  (1.702 m)  ? ?Body mass index is 31.03 kg/m?. ? ?Physical Exam ?Vitals reviewed.  ?Constitutional:   ?   Appearance: Normal appearance.  ?HENT:  ?   Head: Normocephalic.  ?   Mouth/Throat:  ?   Mouth: Mucous membranes are moist.  ?   Pharynx: Oropharynx is clear.  ?Eyes:  ?   Extraocular Movements: Extraocular movements intact.  ?   Conjunctiva/sclera: Conjunctivae normal.  ?   Pupils: Pupils are equal, round, and reactive to light.  ?Cardiovascular:  ?   Rate and Rhythm: Normal rate and regular rhythm.  ?   Pulses: Normal pulses.  ?   Heart sounds: Normal heart sounds.  ?Pulmonary:  ?   Effort: Pulmonary effort is normal.  ?   Breath sounds: Normal breath sounds.  ?Musculoskeletal:  ?   Cervical back: No tenderness.  ?Lymphadenopathy:  ?   Cervical: No cervical adenopathy.  ?Neurological:  ?   Mental Status: She is alert.   ? ? ? ?ASSESSMENT & PLAN: ?Problem List Items Addressed This Visit   ? ?  ? Other  ? Systemic lupus erythematosus ([place])  ? Medication side effect - Primary  ?  Despite signs and symptoms of excess corticosteroid u

## 2022-02-21 ENCOUNTER — Other Ambulatory Visit: Payer: Self-pay

## 2022-02-21 DIAGNOSIS — G8929 Other chronic pain: Secondary | ICD-10-CM

## 2022-02-21 DIAGNOSIS — Z9889 Other specified postprocedural states: Secondary | ICD-10-CM

## 2022-02-21 LAB — TSH: TSH: 2.14 mIU/L

## 2022-02-21 LAB — FOLLICLE STIMULATING HORMONE: FSH: 7 m[IU]/mL

## 2022-02-21 NOTE — Telephone Encounter (Signed)
Received fax from [facility] to determine site of care yet again. Wrote in detail that [facility] Lowe is standalone infusion center and neither home or office based infusion center. ? ?Knox Saliva, PharmD, MPH, BCPS ?Clinical Pharmacist (Rheumatology and Pulmonology) ?

## 2022-02-22 ENCOUNTER — Other Ambulatory Visit: Payer: BC Managed Care – PPO

## 2022-02-22 DIAGNOSIS — D8989 Other specified disorders involving the immune mechanism, not elsewhere classified: Secondary | ICD-10-CM | POA: Diagnosis not present

## 2022-02-22 DIAGNOSIS — M79641 Pain in right hand: Secondary | ICD-10-CM | POA: Diagnosis not present

## 2022-02-22 DIAGNOSIS — M329 Systemic lupus erythematosus, unspecified: Secondary | ICD-10-CM | POA: Diagnosis not present

## 2022-02-22 DIAGNOSIS — M79642 Pain in left hand: Secondary | ICD-10-CM | POA: Diagnosis not present

## 2022-02-22 NOTE — Telephone Encounter (Signed)
I checked CMM for update from [facility] regarding prior authorization for  Saphnelo . Authorization has been APPROVED from 02/15/2022 through 02/14/2023. ? ?Knox Saliva, PharmD, MPH, BCPS ?Clinical Pharmacist (Rheumatology and Pulmonology) ?

## 2022-02-23 MED ORDER — VALACYCLOVIR HCL 1 G PO TABS: ORAL_TABLET | ORAL | 0 refills | Status: DC

## 2022-02-23 NOTE — Addendum Note (Signed)
Addended by: Collier Salina on: 02/23/2022 11:46 AM ? ? Modules accepted: Orders ? ?

## 2022-02-23 NOTE — Telephone Encounter (Signed)
I spoke with Ms. Fahey she is having several complications with the increased prednisone dose and despite this did not see any clear resolution of symptoms. She is already tapering down the dose at recommendations of PCP and gynecologist to target 10 mg daily or less. She had some recent wound dehiscence from vaginal surgery 3 months ago, currently on antibiotics for this. ? ?Bleeding mouth ulcers have also not improved on increased prednisone. She is concerned about cold sores although never had symptoms the same as this before. Recommended trial of valtrex 1000 mg BID for this in case viral ulcers flared due to higher immunosuppression. ? ?Okay to just quit the benlysta which is not having any satisfactory benefit. Process has been started about saphnelo authorization. I did not contact pharmacy about this earlier in the month until she returned to local area, so she is frustrated by delays and lack of clear plan.

## 2022-02-27 ENCOUNTER — Ambulatory Visit: Payer: BC Managed Care – PPO | Admitting: Obstetrics and Gynecology

## 2022-02-27 ENCOUNTER — Encounter: Payer: Self-pay | Admitting: Obstetrics and Gynecology

## 2022-02-27 VITALS — BP 112/70 | HR 73 | Ht 67.0 in | Wt 198.0 lb

## 2022-02-27 DIAGNOSIS — N898 Other specified noninflammatory disorders of vagina: Secondary | ICD-10-CM | POA: Diagnosis not present

## 2022-02-27 DIAGNOSIS — T8131XD Disruption of external operation (surgical) wound, not elsewhere classified, subsequent encounter: Secondary | ICD-10-CM

## 2022-02-27 LAB — WET PREP FOR TRICH, YEAST, CLUE

## 2022-02-27 NOTE — Progress Notes (Signed)
GYNECOLOGY  VISIT ?  ?HPI: ?41 y.o.   Single White or Caucasian Not Hispanic or Latino  female   ?[id number] with Patient's last menstrual period was 10/10/2021.   ?here for follow up for vaginal cuff dehiscence. She states is trying to come off of steroids so her whole body hurts. Last week she was on 25 mg of prednisone, now down to 15 mg a day. ?She states that she has had a little spotting on Friday and yesterday, but no heavy bleeding. She continues to have bloating, swollen feeling. Pelvic pain is about a 2/10, but everything hurts as she is coming off of the steroids. No fevers. No nausea or vomiting. She is tolerating the doxycyline. Normal BM's, normal urinary function.  ? ?GYNECOLOGIC HISTORY: ?Patient's last menstrual period was 10/10/2021. ?Contraception:hysterectomy ?Menopausal hormone therapy: none  ?       ?OB History   ? ? Gravida  ?0  ? Para  ?0  ? Term  ?0  ? Preterm  ?0  ? AB  ?0  ? Living  ?0  ?  ? ? SAB  ?0  ? IAB  ?0  ? Ectopic  ?0  ? Multiple  ?0  ? Live Births  ?0  ?   ?  ?  ?    ? ?Patient Active Problem List  ? Diagnosis Date Noted  ? Medication side effect 02/20/2022  ? Situational anxiety 02/20/2022  ? S/P laparoscopic hysterectomy 10/31/2021  ? Itching 09/09/2021  ? High risk medication use 07/25/2021  ? Finger joint swelling, left 04/01/2021  ? Systemic lupus erythematosus ([place]) 04/01/2021  ? Fibromyalgia 04/01/2021  ? Alopecia 11/17/2020  ? Other fatigue 10/12/2020  ? Migraine without aura   ? History of DVT (deep vein thrombosis)   ? History of endometriosis   ? Right-sided muscle weakness 05/15/2019  ? Abnormal finding on MRI of brain 05/15/2019  ? 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 Tremor 12/24/2017  ? ? ?Past Medical History:  ?Diagnosis Date  ? aub   ? Bruises easily 10/24/2021  ? Complication of anesthesia 10/15/2020  ? hypotension  ? Fibromyalgia   ? GERD (gastroesophageal reflux disease)   ? Heart murmur  PFO   ? off 81 mg aspirin since feb 2022 per pcp  no cardiologist  ?  Hemangioma   ? History of DVT (deep vein thrombosis)   ? right le 7 yrs ago, right leg slight swollen  ? History of endometriosis   ? History of hiatal hernia   ? Hypotension   ? with 10-15-2020 endoscopy  ? Migraine without aura   ? Systemic lupus erythematosus ([place])   ? TIA   ? ages 74 and 17  ? Viral meningitis   ? age 52  ? ? ?Past Surgical History:  ?Procedure Laterality Date  ? ANTERIOR CRUCIATE LIGAMENT REPAIR Left 10/2018  ? CYSTOSCOPY  10/31/2021  ? Procedure: CYSTOSCOPY;  Surgeon: Salvadore Dom, MD;  Location: [facility];  Service: Gynecology;;  ? PELVIC LAPAROSCOPY    ? for endometriosis yrs ago per pt on 10-24-2021  ? right knee lateral release and meniscal repair  2011  ? TONSILLECTOMY    ? age 4, adenoids removed also  ? TOTAL LAPAROSCOPIC HYSTERECTOMY WITH SALPINGECTOMY Bilateral 10/31/2021  ? Procedure: TOTAL LAPAROSCOPIC HYSTERECTOMY WITH SALPINGECTOMY;  Surgeon: Salvadore Dom, MD;  Location: [facility];  Service: Gynecology;  Laterality: Bilateral;  ? UMBILICAL HERNIA REPAIR    ?  yrs ago  ? UPPER GI ENDOSCOPY    ? with esophagus stretching 10-15-2020, 03-11-2020, 05-09-2019,  2013 with being polyp removed  ? ? ?Current Outpatient Medications  ?Medication Sig Dispense Refill  ? chlorhexidine (PERIDEX) 0.12 % solution as needed.    ? cyclobenzaprine (FLEXERIL) 10 MG tablet TAKE 1 TABLET BY MOUTH AT BEDTIME AS NEEDED FOR MUSCLE SPASMS. 30 tablet 1  ? doxycycline (VIBRAMYCIN) 100 MG capsule Take 1 capsule (100 mg total) by mouth 2 (two) times daily. Take BID for 7 days.  Take with food as can cause GI distress. 14 capsule 0  ? hydroxychloroquine (PLAQUENIL) 200 MG tablet Take 2 tablets (400 mg total) by mouth daily. 180 tablet 1  ? hydrOXYzine (ATARAX) 25 MG tablet TAKE AT NIGHT OR UP TO 2 TIMES DAILY AS NEEDED FOR ITCHING 180 tablet 1  ? LORazepam (ATIVAN) 1 MG tablet Take 1 tablet twice a day for 3 days and then twice a day as needed 20 tablet 1  ?  metroNIDAZOLE (FLAGYL) 500 MG tablet Take 1 tablet (500 mg total) by mouth 2 (two) times daily. 14 tablet 0  ? predniSONE (DELTASONE) 10 MG tablet Take 1 tablet (10 mg total) by mouth daily with breakfast. 30 tablet 1  ? sucralfate (CARAFATE) 1 GM/10ML suspension Take 10 mLs (1 g total) by mouth 4 (four) times daily. (Patient taking differently: Take 1 g by mouth as needed.) 420 mL 1  ? valACYclovir (VALTREX) 1000 MG tablet Take 1 tablet (1000 mg) by mouth 2 times daily for 5 days then as needed for mouth sores 30 tablet 0  ? VITAMIN D PO Take 1,000 Units by mouth daily.    ? ?No current facility-administered medications for this visit.  ?  ? ?ALLERGIES: Cephalexin, Penicillins, Amitiza [lubiprostone], and Sulfa antibiotics ? ?Family History  ?Problem Relation Age of Onset  ? Alcohol abuse Mother   ? Cancer Father   ?     Lung and Colon  ? Heart attack Father   ? COPD Father   ? Diabetes Maternal Grandmother   ? Cervical cancer Maternal Grandmother   ? Fibromyalgia Maternal Grandmother   ? Prostate cancer Maternal Grandfather   ? Heart attack Maternal Grandfather   ? ALS Paternal Grandmother   ? Prostate cancer Paternal Grandfather   ? Heart attack Paternal Grandfather   ? Esophageal cancer Paternal Grandfather   ? Breast cancer Other   ? Colon cancer Neg Hx   ? Stomach cancer Neg Hx   ? Rectal cancer Neg Hx   ? ? ?Social History  ? ?Socioeconomic History  ? Marital status: Single  ?  Spouse name: Not on file  ? Number of children: Not on file  ? Years of education: Not on file  ? Highest education level: Not on file  ?Occupational History  ? Not on file  ?Tobacco Use  ? Smoking status: Never  ? Smokeless tobacco: Never  ?Vaping Use  ? Vaping Use: Never used  ?Substance and Sexual Activity  ? Alcohol use: Not Currently  ? Drug use: Never  ? Sexual activity: Yes  ?  Birth control/protection: Surgical  ?  Comment: partner with vasectomy  ?Other Topics Concern  ? Not on file  ?Social History Narrative  ? Not on file   ? ?Social Determinants of Health  ? ?Financial Resource Strain: Not on file  ?Food Insecurity: Not on file  ?Transportation Needs: Not on file  ?Physical Activity: Not on file  ?Stress:  Not on file  ?Social Connections: Not on file  ?Intimate Partner Violence: Not on file  ? ? ?Review of Systems  ?All other systems reviewed and are negative. ? ?PHYSICAL EXAMINATION:   ? ?LMP 10/10/2021     ?General appearance: alert, cooperative and appears stated age ?Abdomen: soft, mildly tender in the left mid abdomen, no rebound, no guarding, non distended, no masses,  no organomegaly ? ?Pelvic: External genitalia:  no lesions ?             Urethra:  normal appearing urethra with no masses, tenderness or lesions ?             Bartholins and Skenes: normal    ?             Vagina: normal appearing vagina with a small amount of thin, white vaginal d/c ? Vaginal cuff appears intact, not tender to gentle palpation.  ?             Cervix: absent ?             Bimanual Exam:  Uterus:  uterus absent ?             Adnexa: no mass, fullness, tenderness ?              ?Chaperone was present for exam. ? ?1. Vaginal cuff dehiscence, subsequent encounter ?Healing well  ?She will complete 7 days of flagyl and doxycyline.  ? ?2. Vaginal discharge ?- WET PREP FOR [place], YEAST, CLUE: negative.  ? ?

## 2022-02-28 ENCOUNTER — Other Ambulatory Visit: Payer: Self-pay | Admitting: Internal Medicine

## 2022-02-28 NOTE — Telephone Encounter (Signed)
Received fax from [facility] that patient is scheduled for first Saphnelo infusion on 03/09/22 ? ?Knox Saliva, PharmD, MPH, BCPS ?Clinical Pharmacist (Rheumatology and Pulmonology) ?

## 2022-02-28 NOTE — Telephone Encounter (Signed)
Called [facility] for update on patient's Saphnelo infusion. Per rep, cost and pricing BIV process has been completed. Scheduling team should be reaching out today or tomorrow to have patient scheduled for first infusion. Provider's office will receive fax confirmation when patient is scheduled ? ?I called patient to notify her of approval and that infusion center will be reaching out to schedule an appt. She verbalized understanding ? ?Phone: [phone] ? ?Knox Saliva, PharmD, MPH, BCPS ?Clinical Pharmacist (Rheumatology and Pulmonology) ?

## 2022-02-28 NOTE — Telephone Encounter (Signed)
Next Visit: 03/06/2022 ?  ?Last Visit: 12/13/2021 ?  ?Last Fill: 11/29/2021 ?  ?Dx: Systemic lupus erythematosus, unspecified SLE type, unspecified organ involvement status  ? ?Current Dose per office note on 12/13/2021: not discussed ? ?Okay to refill Flexeril? ?

## 2022-03-01 ENCOUNTER — Ambulatory Visit: Payer: BC Managed Care – PPO | Admitting: Internal Medicine

## 2022-03-03 ENCOUNTER — Encounter: Payer: Self-pay | Admitting: Obstetrics and Gynecology

## 2022-03-04 ENCOUNTER — Ambulatory Visit
Admission: RE | Admit: 2022-03-04 | Discharge: 2022-03-04 | Disposition: A | Discharge status: Home / Self Care | Payer: BC Managed Care – PPO | Source: Ambulatory Visit | Attending: Orthopaedic Surgery | Admitting: Orthopaedic Surgery

## 2022-03-04 DIAGNOSIS — M25562 Pain in left knee: Secondary | ICD-10-CM | POA: Diagnosis not present

## 2022-03-04 DIAGNOSIS — Z9889 Other specified postprocedural states: Secondary | ICD-10-CM

## 2022-03-04 NOTE — Progress Notes (Signed)
? ?Office Visit Note ? ?Patient: Christy Lowe. Christy Lowe             ?Date of Birth: 1981/03/16           ?MRN: [id number]             ?PCP: Horald Pollen, MD ?Referring: Horald Pollen, * ?Visit Date: 03/06/2022 ? ? ?Subjective:  ?Fibromyalgia (Not going good) ? ? ?History of Present Illness: Christy Lowe is a 41 y.o. female here for follow up for lupus on HCQ 400 mg daily and prednisone tapering down to 10 mg daily after increase to 30 last month from flare up with mouth ulcers and diffuse rashes. She had several complications with high dose prednisone increased weight, mood irritability, and wound dehiscence af hysterectomy cuff site. We discussed plan to start saphenlo infusion as a replacement for benlysta treatment which did not improve symptoms or lab abnormalities much after about 6 months treatment.  She has some improvement in the mouth and lip ulcers she is not sure whether starting the valacyclovir about 4 days ago has had significant effect.  She completed the course of oral antibiotics abdominal pain is partially better. She is scheduled for first saphnelo infusion Thursday and has OBGYN followup later this week. ? ? ? ?Previous HPI ?12/13/21 ?Christy Lowe is a 41 y.o. female here for follow up for systemic lupus with ongoing joint pains and skin rash on HCQ 400 mg daily, Benlysta 200 mg Shively weekly, and prednisone 10 mg daily. Since our last visit she went for hysterectomy this was uneventful and has recovered well. She was off the benlysta for about 1 month in total around the surgery. She has not felt much improvement so far still having a lot of fatigue, skin rash and flushing, joint pain in several areas with left wrist swelling. Headaches are about the same as before but now having recurrent episodes of vertigo. She is now taking hydroxyzine twice on some days due to persistent itching, and feels excessively hot most of the time. She Is back to taking 10 mg of prednisone when  trying to drop this to 5 mg felt extreme fatigue and back pain that felt like bruising and stiffness all over her body. ?  ?Previous HPI ?11/17/20 ?Christy Lowe is a 41 y.o. female here for evaluation of positive ANA. She is noticing multiple symptoms including generalized alopecia, oral dryness, left sided neck swelling, joint pain and stiffness, position tremor, and episodic skin rashes. ?She has had multiple ongoing problems since many years ago. She was hospitalized for viral meningitis at age 19 and feels she has been susceptible to infections ever since this time or before, with numerous cases of bronchitis and has had some lung nodules on CT imaging during these repeat episodes. ?About 7 years ago she suffered from a RLE DVT provoked after right knee arthroscopy and developed right sided face and arm numbness. Workup indicated she had a TIA also noted to have PFO. She takes ASA since that time and has taken anticoagulation in perioperative periods. ?Workup with MRI and LP studies were apparently also indicative for MS. She was treated with multiple rounds of steroids for months at a time and felt a lot of mood disturbance from these. She was never started on other immunomodulatory or immunosuppressive treatments. ?  ?09/2020 ?ANA positive ?dsDNA 24 ? ? ?Review of Systems  ?Constitutional:  Positive for fatigue.  ?HENT:  Positive for mouth dryness.   ?Eyes:  Negative for dryness.  ?Respiratory:  Negative for shortness of breath.   ?Cardiovascular:  Positive for swelling in legs/feet.  ?Gastrointestinal:  Negative for constipation.  ?Endocrine: Positive for excessive thirst.  ?Genitourinary:  Negative for difficulty urinating.  ?Musculoskeletal:  Positive for joint pain, joint pain, joint swelling, morning stiffness and muscle tenderness.  ?Skin:  Positive for rash.  ?Allergic/Immunologic: Positive for susceptible to infections.  ?Neurological:  Negative for numbness.  ?Hematological:  Positive for  bruising/bleeding tendency.  ?Psychiatric/Behavioral:  Positive for sleep disturbance.   ? ?PMFS History:  ?Patient Active Problem List  ? Diagnosis Date Noted  ? Leukocytosis 03/06/2022  ? Medication side effect 02/20/2022  ? Situational anxiety 02/20/2022  ? S/P laparoscopic hysterectomy 10/31/2021  ? Itching 09/09/2021  ? High risk medication use 07/25/2021  ? Finger joint swelling, left 04/01/2021  ? Systemic lupus erythematosus ([place]) 04/01/2021  ? Fibromyalgia 04/01/2021  ? Alopecia 11/17/2020  ? Other fatigue 10/12/2020  ? Migraine without aura   ? History of DVT (deep vein thrombosis)   ? History of endometriosis   ? Right-sided muscle weakness 05/15/2019  ? Abnormal finding on MRI of brain 05/15/2019  ? 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 Tremor 12/24/2017  ?  ?Past Medical History:  ?Diagnosis Date  ? aub   ? Bruises easily 10/24/2021  ? Complication of anesthesia 10/15/2020  ? hypotension  ? Fibromyalgia   ? GERD (gastroesophageal reflux disease)   ? Heart murmur  PFO   ? off 81 mg aspirin since feb 2022 per pcp  no cardiologist  ? Hemangioma   ? History of DVT (deep vein thrombosis)   ? right le 7 yrs ago, right leg slight swollen  ? History of endometriosis   ? History of hiatal hernia   ? Hypotension   ? with 10-15-2020 endoscopy  ? Migraine without aura   ? Systemic lupus erythematosus ([place])   ? TIA   ? ages 18 and 79  ? Viral meningitis   ? age 44  ?  ?Family History  ?Problem Relation Age of Onset  ? Alcohol abuse Mother   ? Cancer Father   ?     Lung and Colon  ? Heart attack Father   ? COPD Father   ? Diabetes Maternal Grandmother   ? Cervical cancer Maternal Grandmother   ? Fibromyalgia Maternal Grandmother   ? Prostate cancer Maternal Grandfather   ? Heart attack Maternal Grandfather   ? ALS Paternal Grandmother   ? Prostate cancer Paternal Grandfather   ? Heart attack Paternal Grandfather   ? Esophageal cancer Paternal Grandfather   ? Breast cancer Other   ? Colon cancer Neg Hx   ? Stomach  cancer Neg Hx   ? Rectal cancer Neg Hx   ? ?Past Surgical History:  ?Procedure Laterality Date  ? ABDOMINAL HYSTERECTOMY    ? ANTERIOR CRUCIATE LIGAMENT REPAIR Left 10/2018  ? CYSTOSCOPY  10/31/2021  ? Procedure: CYSTOSCOPY;  Surgeon: Salvadore Dom, MD;  Location: [facility];  Service: Gynecology;;  ? PELVIC LAPAROSCOPY    ? for endometriosis yrs ago per pt on 10-24-2021  ? right knee lateral release and meniscal repair  2011  ? TONSILLECTOMY    ? age 52, adenoids removed also  ? TOTAL LAPAROSCOPIC HYSTERECTOMY WITH SALPINGECTOMY Bilateral 10/31/2021  ? Procedure: TOTAL LAPAROSCOPIC HYSTERECTOMY WITH SALPINGECTOMY;  Surgeon: Salvadore Dom, MD;  Location: [facility];  Service: Gynecology;  Laterality: Bilateral;  ?  UMBILICAL HERNIA REPAIR    ? yrs ago  ? UPPER GI ENDOSCOPY    ? with esophagus stretching 10-15-2020, 03-11-2020, 05-09-2019,  2013 with being polyp removed  ? ?Social History  ? ?Social History Narrative  ? Not on file  ? ?Immunization History  ?Administered Date(s) Administered  ? PFIZER(Purple Top)SARS-COV-2 Vaccination 06/27/2020, 07/28/2020  ? PPD Test 01/23/2019  ?  ? ?Objective: ?Vital Signs: BP 119/82 (BP Location: Right Arm, Patient Position: Sitting, Cuff Size: Normal)   Pulse 87   Resp 15   Ht '5\' 7"'$  (1.702 m)   Wt 195 lb (88.5 kg)   LMP 10/10/2021   BMI 30.54 kg/m?   ? ?Physical Exam ?HENT:  ?   Mouth/Throat:  ?   Mouth: Mucous membranes are moist.  ?   Comments: Several hyperpigmented patches on lips no current visible ulcer in mouth ?Cardiovascular:  ?   Rate and Rhythm: Normal rate and regular rhythm.  ?Pulmonary:  ?   Effort: Pulmonary effort is normal.  ?   Breath sounds: Normal breath sounds.  ?Skin: ?   General: Skin is warm and dry.  ?   Comments: Blanching erythema on face and chest ?Soft nontender subcutaneous tissue behind left clavicle  ?Neurological:  ?   Mental Status: She is alert.  ?Psychiatric:     ?   Mood and Affect: Mood  normal.  ?  ? ?Musculoskeletal Exam:  ?Shoulders full ROM no tenderness or swelling ?Elbows full ROM no tenderness or swelling ?Wrists full ROM no tenderness or swelling ?Left 5th finger flexor tendon, flexed position, left 2n

## 2022-03-06 ENCOUNTER — Encounter: Payer: Self-pay | Admitting: Internal Medicine

## 2022-03-06 ENCOUNTER — Ambulatory Visit (INDEPENDENT_AMBULATORY_CARE_PROVIDER_SITE_OTHER): Payer: BC Managed Care – PPO | Admitting: Internal Medicine

## 2022-03-06 VITALS — BP 119/82 | HR 87 | Resp 15 | Ht 67.0 in | Wt 195.0 lb

## 2022-03-06 DIAGNOSIS — M329 Systemic lupus erythematosus, unspecified: Secondary | ICD-10-CM

## 2022-03-06 DIAGNOSIS — D72829 Elevated white blood cell count, unspecified: Secondary | ICD-10-CM | POA: Diagnosis not present

## 2022-03-06 DIAGNOSIS — M797 Fibromyalgia: Secondary | ICD-10-CM

## 2022-03-06 DIAGNOSIS — Z79899 Other long term (current) drug therapy: Secondary | ICD-10-CM | POA: Diagnosis not present

## 2022-03-06 NOTE — Telephone Encounter (Signed)
Please schedule office visit with Dr.Jertson in th next few days.  ?

## 2022-03-06 NOTE — Telephone Encounter (Signed)
Patient is scheduled on 03/08/22 for office visit.  ?

## 2022-03-06 NOTE — Telephone Encounter (Signed)
Please make her an appointment in the next few days to come in.  ?

## 2022-03-07 ENCOUNTER — Ambulatory Visit (INDEPENDENT_AMBULATORY_CARE_PROVIDER_SITE_OTHER): Payer: BC Managed Care – PPO | Admitting: Orthopaedic Surgery

## 2022-03-07 ENCOUNTER — Other Ambulatory Visit: Payer: Self-pay | Admitting: Internal Medicine

## 2022-03-07 ENCOUNTER — Encounter: Payer: Self-pay | Admitting: Orthopaedic Surgery

## 2022-03-07 DIAGNOSIS — K1379 Other lesions of oral mucosa: Secondary | ICD-10-CM

## 2022-03-07 DIAGNOSIS — Z9889 Other specified postprocedural states: Secondary | ICD-10-CM | POA: Diagnosis not present

## 2022-03-07 DIAGNOSIS — M25562 Pain in left knee: Secondary | ICD-10-CM

## 2022-03-07 DIAGNOSIS — T8489XD Other specified complication of internal orthopedic prosthetic devices, implants and grafts, subsequent encounter: Secondary | ICD-10-CM

## 2022-03-07 LAB — COMPLETE METABOLIC PANEL WITH GFR
AG Ratio: 1.8 (calc) (ref 1.0–2.5)
ALT: 22 U/L (ref 6–29)
AST: 23 U/L (ref 10–30)
Albumin: 4.6 g/dL (ref 3.6–5.1)
Alkaline phosphatase (APISO): 46 U/L (ref 31–125)
BUN: 16 mg/dL (ref 7–25)
CO2: 28 mmol/L (ref 20–32)
Calcium: 10.1 mg/dL (ref 8.6–10.2)
Chloride: 103 mmol/L (ref 98–110)
Creat: 0.8 mg/dL (ref 0.50–0.97)
Globulin: 2.6 g/dL (calc) (ref 1.9–3.7)
Glucose, Bld: 121 mg/dL — ABNORMAL HIGH (ref 65–99)
Potassium: 5 mmol/L (ref 3.5–5.3)
Sodium: 139 mmol/L (ref 135–146)
Total Bilirubin: 0.3 mg/dL (ref 0.2–1.2)
Total Protein: 7.2 g/dL (ref 6.1–8.1)
eGFR: 97 mL/min/{1.73_m2} (ref >=60)

## 2022-03-07 LAB — CBC WITH DIFFERENTIAL/PLATELET
Absolute Monocytes: 269 cells/uL (ref 200–950)
Basophils Absolute: 32 cells/uL (ref 0–200)
Basophils Relative: 0.4 %
Eosinophils Absolute: 8 cells/uL — ABNORMAL LOW (ref 15–500)
Eosinophils Relative: 0.1 %
HCT: 41.6 % (ref 35.0–45.0)
Hemoglobin: 14.2 g/dL (ref 11.7–15.5)
Lymphs Abs: 995 cells/uL (ref 850–3900)
MCH: 30.4 pg (ref 27.0–33.0)
MCHC: 34.1 g/dL (ref 32.0–36.0)
MCV: 89.1 fL (ref 80.0–100.0)
MPV: 9.5 fL (ref 7.5–12.5)
Monocytes Relative: 3.4 %
Neutro Abs: 6597 cells/uL (ref 1500–7800)
Neutrophils Relative %: 83.5 %
Platelets: 285 10*3/uL (ref 140–400)
RBC: 4.67 10*6/uL (ref 3.80–5.10)
RDW: 12.6 % (ref 11.0–15.0)
Total Lymphocyte: 12.6 %
WBC: 7.9 10*3/uL (ref 3.8–10.8)

## 2022-03-07 LAB — ANTI-DNA ANTIBODY, DOUBLE-STRANDED: ds DNA Ab: 12 IU/mL — ABNORMAL HIGH

## 2022-03-07 LAB — C3 AND C4
C3 Complement: 130 mg/dL (ref 83–193)
C4 Complement: 21 mg/dL (ref 15–57)

## 2022-03-07 NOTE — Telephone Encounter (Signed)
Next Visit: message sent to front desk, Return in about 5 weeks (around 04/12/2022). ? ?Last Visit: 03/06/2022 ? ?Last Fill: 02/23/2022 ? ?Dx: Systemic lupus erythematosus, unspecified SLE type, unspecified organ involvement status  ? ?Current Dose per office note on 03/06/2022: not mentioned ? ?Okay to refill valtrex?  ? ?

## 2022-03-07 NOTE — Telephone Encounter (Signed)
Please call patient to schedule f/u appt. Thank you. ? ?Return in about 5 weeks (around 04/12/2022). ?

## 2022-03-07 NOTE — Progress Notes (Signed)
The patient is here for follow-up after having an MRI of her left knee.  She has a history of an ACL reconstruction done elsewhere in 2019.  She then had an injury as it relates to believe getting on or off a horse.  She denies any instability in the knee except for if she hyperextends in the knee.  She does have some deep knee pain itself.  She does have a history of lupus and is on medications that are related to that including steroids. ? ?MRI of the left knee does show what the radiologist describes as a complete tear of the ACL reconstruction.  There is no tear of the medial or lateral meniscus.  I did show her the slight edema in the posterior medial tibial plateau but otherwise the knee shows no fracture. ? ?Examination of the left knee does not show any effusion today.  I believe there is some scarring because the knee does not feel significantly lax ligamentously. ? ?I went over her MRI findings with her and would like her to see Dr. Marlou Sa in follow-up to further assess her left knee.  She agrees with this treatment plan. ?

## 2022-03-07 NOTE — Telephone Encounter (Signed)
FYI:  Patient was asked to schedule follow-up appointment at [place].  Patient stated "not yet" and walked out of the office.   ?

## 2022-03-08 ENCOUNTER — Other Ambulatory Visit: Payer: Self-pay

## 2022-03-08 ENCOUNTER — Ambulatory Visit (INDEPENDENT_AMBULATORY_CARE_PROVIDER_SITE_OTHER): Payer: BC Managed Care – PPO | Admitting: Obstetrics and Gynecology

## 2022-03-08 ENCOUNTER — Telehealth: Payer: Self-pay

## 2022-03-08 VITALS — BP 118/63 | HR 66 | Wt 198.0 lb

## 2022-03-08 DIAGNOSIS — R350 Frequency of micturition: Secondary | ICD-10-CM

## 2022-03-08 DIAGNOSIS — M79641 Pain in right hand: Secondary | ICD-10-CM | POA: Diagnosis not present

## 2022-03-08 DIAGNOSIS — T8131XD Disruption of external operation (surgical) wound, not elsewhere classified, subsequent encounter: Secondary | ICD-10-CM

## 2022-03-08 DIAGNOSIS — R102 Pelvic and perineal pain unspecified side: Secondary | ICD-10-CM

## 2022-03-08 DIAGNOSIS — M79642 Pain in left hand: Secondary | ICD-10-CM | POA: Diagnosis not present

## 2022-03-08 DIAGNOSIS — R140 Abdominal distension (gaseous): Secondary | ICD-10-CM

## 2022-03-08 DIAGNOSIS — T81328D Disruption or dehiscence of closure of other specified internal operation (surgical) wound, subsequent encounter: Secondary | ICD-10-CM

## 2022-03-08 DIAGNOSIS — M329 Systemic lupus erythematosus, unspecified: Secondary | ICD-10-CM | POA: Diagnosis not present

## 2022-03-08 DIAGNOSIS — Z7952 Long term (current) use of systemic steroids: Secondary | ICD-10-CM

## 2022-03-08 MED ORDER — METRONIDAZOLE 500 MG PO TABS: 500.0000 mg | ORAL_TABLET | Freq: Two times a day (BID) | ORAL | 0 refills | Status: DC

## 2022-03-08 MED ORDER — DOXYCYCLINE HYCLATE 100 MG PO CAPS: 100.0000 mg | ORAL_CAPSULE | Freq: Two times a day (BID) | ORAL | 0 refills | Status: DC

## 2022-03-08 NOTE — Progress Notes (Signed)
GYNECOLOGY  VISIT ?  ?HPI: ?41 y.o.   Single White or Caucasian Not Hispanic or Latino  female   ?[id number] with Patient's last menstrual period was 10/10/2021.   ?here for lower abdominal pain and swelling.   ? ?She is s/p a laparoscopic hysterectomy on 10/31/22. She had an uneventful post operative course.  ?She was seen in the ER in [place] on 02/16/22 after having sever pain with intercourse. They thought she had a ruptured cyst and sent her home. ?On 02/20/22 she was seen here and was noted to have a small tear in the top of the vaginal cuff, intact. Afebrile, no signs of infection. I suspect that she had a vaginal cuff dehiscence, by the time I saw her the cuff was closing on it's own, so she was managed medically. She was treated with flagyl and doxy. ?She has been weaning off of high dose steroids. At the time of the dehiscence she was on 35 mg of prednisone daily. She is now on 12.5 mg a day. ? ?She c/o bloating and intermittent menstrual like cramping in her lower abdomen and lower back. When she wakes up in the am she feels better, she gets more bloated and more uncomfortable as the day goes on. She had a WBC of 8 on Monday with her Rheumatologist. She reports that it was elevated a few weeks ago. ?No vaginal d/c no bleeding.  ?She is having normal BM's for her which is once every 2 day.  ?She c/o an increase in frequency of urination, small amounts. No dysuria. No urgency.  ? ?With weaning of her steroids, she is also having fatigue, rashes, and joint pain.  ? ?She is supposed to start a once a month infusion for her Lupus tomorrow, it causes immunosuppression.  ? ?GYNECOLOGIC HISTORY: ?Patient's last menstrual period was 10/10/2021. ?Contraception:hysterectomy  ?Menopausal hormone therapy: none ?       ?OB History   ? ? Gravida  ?0  ? Para  ?0  ? Term  ?0  ? Preterm  ?0  ? AB  ?0  ? Living  ?0  ?  ? ? SAB  ?0  ? IAB  ?0  ? Ectopic  ?0  ? Multiple  ?0  ? Live Births  ?0  ?   ?  ?  ?    ? ?Patient Active Problem  List  ? Diagnosis Date Noted  ? Leukocytosis 03/06/2022  ? Medication side effect 02/20/2022  ? Situational anxiety 02/20/2022  ? S/P laparoscopic hysterectomy 10/31/2021  ? Itching 09/09/2021  ? High risk medication use 07/25/2021  ? Finger joint swelling, left 04/01/2021  ? Systemic lupus erythematosus ([place]) 04/01/2021  ? Fibromyalgia 04/01/2021  ? Alopecia 11/17/2020  ? Other fatigue 10/12/2020  ? Migraine without aura   ? History of DVT (deep vein thrombosis)   ? History of endometriosis   ? Right-sided muscle weakness 05/15/2019  ? Abnormal finding on MRI of brain 05/15/2019  ? 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 Tremor 12/24/2017  ? ? ?Past Medical History:  ?Diagnosis Date  ? aub   ? Bruises easily 10/24/2021  ? Complication of anesthesia 10/15/2020  ? hypotension  ? Fibromyalgia   ? GERD (gastroesophageal reflux disease)   ? Heart murmur  PFO   ? off 81 mg aspirin since feb 2022 per pcp  no cardiologist  ? Hemangioma   ? History of DVT (deep vein thrombosis)   ? right le 7 yrs ago, right  leg slight swollen  ? History of endometriosis   ? History of hiatal hernia   ? Hypotension   ? with 10-15-2020 endoscopy  ? Migraine without aura   ? Systemic lupus erythematosus ([place])   ? TIA   ? ages 65 and 48  ? Viral meningitis   ? age 68  ? ? ?Past Surgical History:  ?Procedure Laterality Date  ? ABDOMINAL HYSTERECTOMY    ? ANTERIOR CRUCIATE LIGAMENT REPAIR Left 10/2018  ? CYSTOSCOPY  10/31/2021  ? Procedure: CYSTOSCOPY;  Surgeon: Salvadore Dom, MD;  Location: [facility];  Service: Gynecology;;  ? PELVIC LAPAROSCOPY    ? for endometriosis yrs ago per pt on 10-24-2021  ? right knee lateral release and meniscal repair  2011  ? TONSILLECTOMY    ? age 89, adenoids removed also  ? TOTAL LAPAROSCOPIC HYSTERECTOMY WITH SALPINGECTOMY Bilateral 10/31/2021  ? Procedure: TOTAL LAPAROSCOPIC HYSTERECTOMY WITH SALPINGECTOMY;  Surgeon: Salvadore Dom, MD;  Location: [facility];  Service:  Gynecology;  Laterality: Bilateral;  ? UMBILICAL HERNIA REPAIR    ? yrs ago  ? UPPER GI ENDOSCOPY    ? with esophagus stretching 10-15-2020, 03-11-2020, 05-09-2019,  2013 with being polyp removed  ? ? ?Current Outpatient Medications  ?Medication Sig Dispense Refill  ? chlorhexidine (PERIDEX) 0.12 % solution as needed.    ? cyclobenzaprine (FLEXERIL) 10 MG tablet TAKE 1 TABLET BY MOUTH AT BEDTIME AS NEEDED FOR MUSCLE SPASMS. 30 tablet 1  ? doxycycline (VIBRAMYCIN) 100 MG capsule Take 1 capsule (100 mg total) by mouth 2 (two) times daily. Take BID for 7 days.  Take with food as can cause GI distress. (Patient not taking: Reported on 03/06/2022) 14 capsule 0  ? hydroxychloroquine (PLAQUENIL) 200 MG tablet Take 2 tablets (400 mg total) by mouth daily. 180 tablet 1  ? hydrOXYzine (ATARAX) 25 MG tablet TAKE AT NIGHT OR UP TO 2 TIMES DAILY AS NEEDED FOR ITCHING 180 tablet 1  ? LORazepam (ATIVAN) 1 MG tablet Take 1 tablet twice a day for 3 days and then twice a day as needed (Patient not taking: Reported on 03/06/2022) 20 tablet 1  ? metroNIDAZOLE (FLAGYL) 500 MG tablet Take 1 tablet (500 mg total) by mouth 2 (two) times daily. (Patient not taking: Reported on 03/06/2022) 14 tablet 0  ? predniSONE (DELTASONE) 10 MG tablet Take 1 tablet (10 mg total) by mouth daily with breakfast. (Patient taking differently: Take 10 mg by mouth daily with breakfast. 15 mg) 30 tablet 1  ? sucralfate (CARAFATE) 1 GM/10ML suspension Take 10 mLs (1 g total) by mouth 4 (four) times daily. (Patient taking differently: Take 1 g by mouth as needed.) 420 mL 1  ? valACYclovir (VALTREX) 1000 MG tablet Take 1 tablet (1000 mg) by mouth 2 times daily for 5 days then as needed for mouth sores 30 tablet 0  ? VITAMIN D PO Take 1,000 Units by mouth daily.    ? ?No current facility-administered medications for this visit.  ?  ? ?ALLERGIES: Cephalexin, Penicillins, Amitiza [lubiprostone], and Sulfa antibiotics ? ?Family History  ?Problem Relation Age of Onset  ?  Alcohol abuse Mother   ? Cancer Father   ?     Lung and Colon  ? Heart attack Father   ? COPD Father   ? Diabetes Maternal Grandmother   ? Cervical cancer Maternal Grandmother   ? Fibromyalgia Maternal Grandmother   ? Prostate cancer Maternal Grandfather   ? Heart attack  Maternal Grandfather   ? ALS Paternal Grandmother   ? Prostate cancer Paternal Grandfather   ? Heart attack Paternal Grandfather   ? Esophageal cancer Paternal Grandfather   ? Breast cancer Other   ? Colon cancer Neg Hx   ? Stomach cancer Neg Hx   ? Rectal cancer Neg Hx   ? ? ?Social History  ? ?Socioeconomic History  ? Marital status: Single  ?  Spouse name: Not on file  ? Number of children: Not on file  ? Years of education: Not on file  ? Highest education level: Not on file  ?Occupational History  ? Not on file  ?Tobacco Use  ? Smoking status: Never  ? Smokeless tobacco: Never  ?Vaping Use  ? Vaping Use: Never used  ?Substance and Sexual Activity  ? Alcohol use: Not Currently  ? Drug use: Never  ? Sexual activity: Yes  ?  Birth control/protection: Surgical  ?  Comment: partner with vasectomy  ?Other Topics Concern  ? Not on file  ?Social History Narrative  ? Not on file  ? ?Social Determinants of Health  ? ?Financial Resource Strain: Not on file  ?Food Insecurity: Not on file  ?Transportation Needs: Not on file  ?Physical Activity: Not on file  ?Stress: Not on file  ?Social Connections: Not on file  ?Intimate Partner Violence: Not on file  ? ? ?ROS ? ?PHYSICAL EXAMINATION:   ? ?LMP 10/10/2021     ?General appearance: alert, cooperative and appears stated age ?Abdomen: soft, mildly tender in the LLQ and suprapubic region. Mildly distended, no masses,  no organomegaly ? ?Pelvic: External genitalia:  no lesions ?             Urethra:  normal appearing urethra with no masses, tenderness or lesions ?             Bartholins and Skenes: normal    ?             Vagina: normal appearing vagina with normal color and discharge, no lesions. The cuff  appears well healed, no erythema, no drainage. Her cuff is tender to palpation.  ?             Cervix: absent ?             Bimanual Exam:  Uterus:  uterus absent ?             Adnexa: no mass, fullness, tende

## 2022-03-08 NOTE — Telephone Encounter (Signed)
Per verbal order from Dr. Talbert Nan order for CT Scan placed with [facility]. Patient provided phone number to call and schedule so she can answer their screening questions. I did request ASAP for appointment. ?

## 2022-03-09 ENCOUNTER — Telehealth: Payer: Self-pay | Admitting: Pharmacist

## 2022-03-09 ENCOUNTER — Encounter: Payer: Self-pay | Admitting: Obstetrics and Gynecology

## 2022-03-09 DIAGNOSIS — M329 Systemic lupus erythematosus, unspecified: Secondary | ICD-10-CM | POA: Diagnosis not present

## 2022-03-09 DIAGNOSIS — T8131XD Disruption of external operation (surgical) wound, not elsewhere classified, subsequent encounter: Secondary | ICD-10-CM

## 2022-03-09 MED ORDER — DOXYCYCLINE HYCLATE 100 MG PO CAPS: 100.0000 mg | ORAL_CAPSULE | Freq: Two times a day (BID) | ORAL | 0 refills | Status: DC

## 2022-03-09 MED ORDER — METRONIDAZOLE 500 MG PO TABS: 500.0000 mg | ORAL_TABLET | Freq: Two times a day (BID) | ORAL | 0 refills | Status: DC

## 2022-03-09 NOTE — Telephone Encounter (Signed)
Received fax from [facility] regarding first Saphnelo '300mg'$  IV infusion that patient received on 03/09/22. ? ?Patient tolerated infusion without complications.  ? ?Next Saphnelo infusion scheduled for 04/06/22 ? ?Knox Saliva, PharmD, MPH, BCPS ?Clinical Pharmacist (Rheumatology and Pulmonology)  ?

## 2022-03-09 NOTE — Telephone Encounter (Signed)
Had to correct order this morning to CT pelvis as I had in error placed order for CT abd/pelvis. Patient has still not called to schedule the appointment. ?

## 2022-03-09 NOTE — Addendum Note (Signed)
Addended by: Ramond Craver on: 03/09/2022 09:55 AM ? ? Modules accepted: Orders ? ?

## 2022-03-10 LAB — URINE CULTURE
MICRO NUMBER:: 13259176
Result:: NO GROWTH
SPECIMEN QUALITY:: ADEQUATE

## 2022-03-10 LAB — URINALYSIS, COMPLETE W/RFL CULTURE
Bilirubin Urine: NEGATIVE
Glucose, UA: NEGATIVE
Hgb urine dipstick: NEGATIVE
Hyaline Cast: NONE SEEN /LPF
Ketones, ur: NEGATIVE
Leukocyte Esterase: NEGATIVE
Nitrites, Initial: NEGATIVE
Protein, ur: NEGATIVE
RBC / HPF: NONE SEEN /HPF (ref 0–2)
Specific Gravity, Urine: 1.026 (ref 1.001–1.035)
Squamous Epithelial / HPF: NONE SEEN /HPF (ref <=5)
WBC, UA: NONE SEEN /HPF (ref 0–5)
pH: 5.5 (ref 5.0–8.0)

## 2022-03-10 LAB — CULTURE INDICATED

## 2022-03-19 ENCOUNTER — Other Ambulatory Visit: Payer: Self-pay | Admitting: Internal Medicine

## 2022-03-19 DIAGNOSIS — M359 Systemic involvement of connective tissue, unspecified: Secondary | ICD-10-CM

## 2022-03-20 NOTE — Telephone Encounter (Signed)
Please schedule patient for a follow up visit. Patient due May 2023. Thanks! ?

## 2022-03-20 NOTE — Telephone Encounter (Signed)
Next Visit: Due May 2023. Message sent to the front to schedule.  ? ?Last Visit: 03/06/2022 ? ?Last Fill: 01/23/2022 ? ?Dx:  Systemic lupus erythematosus, unspecified SLE type, unspecified organ involvement status  ? ?Current Dose per office note on 03/06/2022: Tapering down prednisone agree continuing reducing as planned towards 10 mg daily. ? ?Okay to refill Prednisone?   ?

## 2022-03-22 ENCOUNTER — Other Ambulatory Visit: Payer: Self-pay

## 2022-03-24 ENCOUNTER — Other Ambulatory Visit: Payer: Self-pay | Admitting: Internal Medicine

## 2022-03-24 DIAGNOSIS — M359 Systemic involvement of connective tissue, unspecified: Secondary | ICD-10-CM

## 2022-03-24 MED ORDER — CYCLOBENZAPRINE HCL 10 MG PO TABS: ORAL_TABLET | ORAL | 1 refills | Status: DC

## 2022-03-24 MED ORDER — PREDNISONE 10 MG PO TABS: 10.0000 mg | ORAL_TABLET | Freq: Every day | ORAL | 1 refills | Status: DC

## 2022-03-24 NOTE — Telephone Encounter (Signed)
Next Visit: 04/06/2022 ?  ?Last Visit: 03/06/2022 ?  ?Last Fill: 01/23/2022 ?  ?Dx:  Systemic lupus erythematosus, unspecified SLE type, unspecified organ involvement status  ?  ?Current Dose per office note on 03/06/2022: Tapering down prednisone agree continuing reducing as planned towards 10 mg daily. ?  ?Okay to refill Prednisone and Flexeril?   ?

## 2022-03-24 NOTE — Telephone Encounter (Signed)
Patient called the office requesting a refill of Prenidone 2.5 mg and Flexeril '10mg'$  be sent to [facility] on [address] in [place]. ?

## 2022-03-27 MED ORDER — PREDNISONE 2.5 MG PO TABS: 2.5000 mg | ORAL_TABLET | Freq: Every day | ORAL | 1 refills | Status: DC

## 2022-03-27 NOTE — Addendum Note (Signed)
Addended by: Carole Binning on: 03/27/2022 01:37 PM ? ? Modules accepted: Orders ? ?

## 2022-03-27 NOTE — Telephone Encounter (Signed)
CT order cancelled.   ? ? I called but patient's voice mail box is full. ?

## 2022-03-27 NOTE — Telephone Encounter (Signed)
Dr. Talbert Nan,   Patient has not yet scheduled the CT of pelvis and also has some diagnostic breast imaging to be scheduled. The radiology center has been trying to reach her. ? ?She is scheduled to see you for a follow up appt on 04/05/22. ?

## 2022-03-27 NOTE — Telephone Encounter (Signed)
She is out of the [place]. She must be feeling better, please cancel the CT order. Please let her know the breast center is trying to reach her so she can schedule the breast imaging upon her return.  ?

## 2022-03-30 NOTE — Progress Notes (Signed)
? ?Office Visit Note ? ?Patient: Christy Lowe. Christy Lowe             ?Date of Birth: 28-Nov-1980           ?MRN: [id number]             ?PCP: Horald Pollen, MD ?Referring: Horald Pollen, * ?Visit Date: 04/06/2022 ? ? ?Subjective:  ?Follow-up (Improving since infusion, today is second infusion) ? ? ?History of Present Illness: Christy Lowe is a 41 y.o. female here for follow up for lupus on HCQ 400 mg daily started saphnelo infusion last month and prednisone tapering down to 2.5 mg daily now. She felt a large improvement after her first treatment with decreased joint pains and decreased abdominal pain. She is tapering off the prednisone without severe problem. She has noticed oral ulcers on the top of her mouth for several days and is having a rash with small papules on both sides of her neck and lower face. Appetite is decreased but not losing any weight. She feels a lot of stress after dealing with work incident. ? ?Previous HPI ?03/06/2022 ?Christy Lowe is a 41 y.o. female here for follow up for lupus on HCQ 400 mg daily and prednisone tapering down to 10 mg daily after increase to 30 last month from flare up with mouth ulcers and diffuse rashes. She had several complications with high dose prednisone increased weight, mood irritability, and wound dehiscence af hysterectomy cuff site. We discussed plan to start saphenlo infusion as a replacement for benlysta treatment which did not improve symptoms or lab abnormalities much after about 6 months treatment.  She has some improvement in the mouth and lip ulcers she is not sure whether starting the valacyclovir about 4 days ago has had significant effect.  She completed the course of oral antibiotics abdominal pain is partially better. She is scheduled for first saphnelo infusion Thursday and has OBGYN followup later this week. ?  ?  ?  ?Previous HPI ?12/13/21 ?Christy Lowe is a 41 y.o. female here for follow up for systemic lupus with ongoing  joint pains and skin rash on HCQ 400 mg daily, Benlysta 200 mg  weekly, and prednisone 10 mg daily. Since our last visit she went for hysterectomy this was uneventful and has recovered well. She was off the benlysta for about 1 month in total around the surgery. She has not felt much improvement so far still having a lot of fatigue, skin rash and flushing, joint pain in several areas with left wrist swelling. Headaches are about the same as before but now having recurrent episodes of vertigo. She is now taking hydroxyzine twice on some days due to persistent itching, and feels excessively hot most of the time. She Is back to taking 10 mg of prednisone when trying to drop this to 5 mg felt extreme fatigue and back pain that felt like bruising and stiffness all over her body. ?  ?Previous HPI ?11/17/20 ?Christy Lowe is a 41 y.o. female here for evaluation of positive ANA. She is noticing multiple symptoms including generalized alopecia, oral dryness, left sided neck swelling, joint pain and stiffness, position tremor, and episodic skin rashes. ?She has had multiple ongoing problems since many years ago. She was hospitalized for viral meningitis at age 63 and feels she has been susceptible to infections ever since this time or before, with numerous cases of bronchitis and has had some lung nodules on CT imaging during these repeat  episodes. ?About 7 years ago she suffered from a RLE DVT provoked after right knee arthroscopy and developed right sided face and arm numbness. Workup indicated she had a TIA also noted to have PFO. She takes ASA since that time and has taken anticoagulation in perioperative periods. ?Workup with MRI and LP studies were apparently also indicative for MS. She was treated with multiple rounds of steroids for months at a time and felt a lot of mood disturbance from these. She was never started on other immunomodulatory or immunosuppressive treatments. ?  ?09/2020 ?ANA positive ?dsDNA 24 ?   ? ? ?Review of Systems  ?Constitutional:  Positive for fatigue.  ?HENT:  Negative for mouth dryness.   ?Eyes:  Negative for dryness.  ?Respiratory:  Negative for shortness of breath.   ?Cardiovascular:  Positive for swelling in legs/feet.  ?Gastrointestinal:  Negative for constipation.  ?Endocrine: Positive for heat intolerance.  ?Genitourinary:  Negative for difficulty urinating.  ?Musculoskeletal:  Positive for gait problem, joint swelling and morning stiffness.  ?Skin:  Positive for rash.  ?Allergic/Immunologic: Negative for susceptible to infections.  ?Neurological:  Positive for dizziness. Negative for numbness.  ?Hematological:  Positive for bruising/bleeding tendency.  ?Psychiatric/Behavioral:  Positive for sleep disturbance.   ? ?PMFS History:  ?Patient Active Problem List  ? Diagnosis Date Noted  ? Rash and other nonspecific skin eruption 04/06/2022  ? Leukocytosis 03/06/2022  ? Medication side effect 02/20/2022  ? Situational anxiety 02/20/2022  ? S/P laparoscopic hysterectomy 10/31/2021  ? Itching 09/09/2021  ? High risk medication use 07/25/2021  ? Finger joint swelling, left 04/01/2021  ? Systemic lupus erythematosus ([place]) 04/01/2021  ? Fibromyalgia 04/01/2021  ? Alopecia 11/17/2020  ? Other fatigue 10/12/2020  ? Migraine without aura   ? History of DVT (deep vein thrombosis)   ? History of endometriosis   ? Right-sided muscle weakness 05/15/2019  ? Abnormal finding on MRI of brain 05/15/2019  ? 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 Tremor 12/24/2017  ?  ?Past Medical History:  ?Diagnosis Date  ? aub   ? Bruises easily 10/24/2021  ? Complication of anesthesia 10/15/2020  ? hypotension  ? Fibromyalgia   ? GERD (gastroesophageal reflux disease)   ? Heart murmur  PFO   ? off 81 mg aspirin since feb 2022 per pcp  no cardiologist  ? Hemangioma   ? History of DVT (deep vein thrombosis)   ? right le 7 yrs ago, right leg slight swollen  ? History of endometriosis   ? History of hiatal hernia   ? Hypotension    ? with 10-15-2020 endoscopy  ? Migraine without aura   ? Systemic lupus erythematosus ([place])   ? TIA   ? ages 57 and 33  ? Viral meningitis   ? age 40  ?  ?Family History  ?Problem Relation Age of Onset  ? Alcohol abuse Mother   ? Cancer Father   ?     Lung and Colon  ? Heart attack Father   ? COPD Father   ? Diabetes Maternal Grandmother   ? Cervical cancer Maternal Grandmother   ? Fibromyalgia Maternal Grandmother   ? Prostate cancer Maternal Grandfather   ? Heart attack Maternal Grandfather   ? ALS Paternal Grandmother   ? Prostate cancer Paternal Grandfather   ? Heart attack Paternal Grandfather   ? Esophageal cancer Paternal Grandfather   ? Breast cancer Other   ? Colon cancer Neg Hx   ? Stomach cancer Neg Hx   ?  Rectal cancer Neg Hx   ? ?Past Surgical History:  ?Procedure Laterality Date  ? ABDOMINAL HYSTERECTOMY    ? ANTERIOR CRUCIATE LIGAMENT REPAIR Left 10/2018  ? CYSTOSCOPY  10/31/2021  ? Procedure: CYSTOSCOPY;  Surgeon: Salvadore Dom, MD;  Location: [facility];  Service: Gynecology;;  ? PELVIC LAPAROSCOPY    ? for endometriosis yrs ago per pt on 10-24-2021  ? right knee lateral release and meniscal repair  2011  ? TONSILLECTOMY    ? age 26, adenoids removed also  ? TOTAL LAPAROSCOPIC HYSTERECTOMY WITH SALPINGECTOMY Bilateral 10/31/2021  ? Procedure: TOTAL LAPAROSCOPIC HYSTERECTOMY WITH SALPINGECTOMY;  Surgeon: Salvadore Dom, MD;  Location: [facility];  Service: Gynecology;  Laterality: Bilateral;  ? UMBILICAL HERNIA REPAIR    ? yrs ago  ? UPPER GI ENDOSCOPY    ? with esophagus stretching 10-15-2020, 03-11-2020, 05-09-2019,  2013 with being polyp removed  ? ?Social History  ? ?Social History Narrative  ? Not on file  ? ?Immunization History  ?Administered Date(s) Administered  ? PFIZER(Purple Top)SARS-COV-2 Vaccination 06/27/2020, 07/28/2020  ? PPD Test 01/23/2019  ?  ? ?Objective: ?Vital Signs: BP 110/75 (BP Location: Left Arm, Patient Position: Sitting, Cuff  Size: Normal)   Pulse 93   Resp 15   Ht '5\' 7"'$  (1.702 m)   Wt 198 lb (89.8 kg)   LMP 10/10/2021   BMI 31.01 kg/m?   ? ?Physical Exam ?HENT:  ?   Mouth/Throat:  ?   Comments: Ulcers on mouth behind front tee

## 2022-04-05 ENCOUNTER — Ambulatory Visit (INDEPENDENT_AMBULATORY_CARE_PROVIDER_SITE_OTHER): Payer: BC Managed Care – PPO | Admitting: Orthopedic Surgery

## 2022-04-05 ENCOUNTER — Encounter: Payer: Self-pay | Admitting: Obstetrics and Gynecology

## 2022-04-05 ENCOUNTER — Ambulatory Visit: Payer: BC Managed Care – PPO | Admitting: Obstetrics and Gynecology

## 2022-04-05 VITALS — BP 110/74 | HR 72 | Ht 67.0 in | Wt 198.0 lb

## 2022-04-05 DIAGNOSIS — T8131XD Disruption of external operation (surgical) wound, not elsewhere classified, subsequent encounter: Secondary | ICD-10-CM

## 2022-04-05 DIAGNOSIS — T8489XD Other specified complication of internal orthopedic prosthetic devices, implants and grafts, subsequent encounter: Secondary | ICD-10-CM | POA: Diagnosis not present

## 2022-04-05 NOTE — Progress Notes (Signed)
GYNECOLOGY  VISIT ?  ?HPI: ?41 y.o.   Single White or Caucasian Not Hispanic or Latino  female   ?[id number] with Patient's last menstrual period was 10/10/2021.   ?here for f/u of vaginal cuff dehiscence. ? ?She is s/p a laparoscopic hysterectomy on 10/31/22. She had an uneventful post operative course. She was on high dose steroids to treat her lupus. On 02/16/22 she was seen in the ER in [place] after having severe pain with intercourse. They thought she had a ruptured cyst and sent her home. ?On 02/20/22 she was seen here and was noted to have a small tear in the top of the vaginal cuff, intact. Afebrile, no signs of infection. I suspect that she had a vaginal cuff dehiscence, by the time I saw her the cuff was closing on it's own, so she was managed medically. She was treated with flagyl and doxy. ? ?She was seen here on 03/08/22 during the process of weaning off of her steroids c/o abdominal pain. Her cuff appeared to be healing well, but secondary to tenderness on exam she was retreated with antibiotics and a CT was ordered (she ended up not doing the CT, couldn't get it scheduled prior to leaving the state for work). Her WBC was normal.  ? ?She is here for f/u and is feeling fine. No abdominal pain, no bowel or bladder c/o. Everything felt better after her infusion for her Lupus.  ? ?She had a mammogram a few years ago, in care everywhere ? ?GYNECOLOGIC HISTORY: ?Patient's last menstrual period was 10/10/2021. ?Contraception:hysterectomy ?Menopausal hormone therapy: no ?       ?OB History   ? ? Gravida  ?0  ? Para  ?0  ? Term  ?0  ? Preterm  ?0  ? AB  ?0  ? Living  ?0  ?  ? ? SAB  ?0  ? IAB  ?0  ? Ectopic  ?0  ? Multiple  ?0  ? Live Births  ?0  ?   ?  ?  ?    ? ?Patient Active Problem List  ? Diagnosis Date Noted  ? Leukocytosis 03/06/2022  ? Medication side effect 02/20/2022  ? Situational anxiety 02/20/2022  ? S/P laparoscopic hysterectomy 10/31/2021  ? Itching 09/09/2021  ? High risk medication use 07/25/2021  ?  Finger joint swelling, left 04/01/2021  ? Systemic lupus erythematosus ([place]) 04/01/2021  ? Fibromyalgia 04/01/2021  ? Alopecia 11/17/2020  ? Other fatigue 10/12/2020  ? Migraine without aura   ? History of DVT (deep vein thrombosis)   ? History of endometriosis   ? Right-sided muscle weakness 05/15/2019  ? Abnormal finding on MRI of brain 05/15/2019  ? 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 Tremor 12/24/2017  ? ? ?Past Medical History:  ?Diagnosis Date  ? aub   ? Bruises easily 10/24/2021  ? Complication of anesthesia 10/15/2020  ? hypotension  ? Fibromyalgia   ? GERD (gastroesophageal reflux disease)   ? Heart murmur  PFO   ? off 81 mg aspirin since feb 2022 per pcp  no cardiologist  ? Hemangioma   ? History of DVT (deep vein thrombosis)   ? right le 7 yrs ago, right leg slight swollen  ? History of endometriosis   ? History of hiatal hernia   ? Hypotension   ? with 10-15-2020 endoscopy  ? Migraine without aura   ? Systemic lupus erythematosus ([place])   ? TIA   ? ages 15 and 89  ?  Viral meningitis   ? age 67  ? ? ?Past Surgical History:  ?Procedure Laterality Date  ? ABDOMINAL HYSTERECTOMY    ? ANTERIOR CRUCIATE LIGAMENT REPAIR Left 10/2018  ? CYSTOSCOPY  10/31/2021  ? Procedure: CYSTOSCOPY;  Surgeon: Salvadore Dom, MD;  Location: [facility];  Service: Gynecology;;  ? PELVIC LAPAROSCOPY    ? for endometriosis yrs ago per pt on 10-24-2021  ? right knee lateral release and meniscal repair  2011  ? TONSILLECTOMY    ? age 53, adenoids removed also  ? TOTAL LAPAROSCOPIC HYSTERECTOMY WITH SALPINGECTOMY Bilateral 10/31/2021  ? Procedure: TOTAL LAPAROSCOPIC HYSTERECTOMY WITH SALPINGECTOMY;  Surgeon: Salvadore Dom, MD;  Location: [facility];  Service: Gynecology;  Laterality: Bilateral;  ? UMBILICAL HERNIA REPAIR    ? yrs ago  ? UPPER GI ENDOSCOPY    ? with esophagus stretching 10-15-2020, 03-11-2020, 05-09-2019,  2013 with being polyp removed  ? ? ?Current Outpatient Medications   ?Medication Sig Dispense Refill  ? Anifrolumab-fnia 300 MG/2ML SOLN Inject 300 mg into the vein every 28 (twenty-eight) days.    ? chlorhexidine (PERIDEX) 0.12 % solution as needed.    ? cyclobenzaprine (FLEXERIL) 10 MG tablet TAKE 1 TABLET BY MOUTH AT BEDTIME AS NEEDED FOR MUSCLE SPASMS. 30 tablet 1  ? hydroxychloroquine (PLAQUENIL) 200 MG tablet Take 2 tablets (400 mg total) by mouth daily. 180 tablet 1  ? hydrOXYzine (ATARAX) 25 MG tablet TAKE AT NIGHT OR UP TO 2 TIMES DAILY AS NEEDED FOR ITCHING 180 tablet 1  ? LORazepam (ATIVAN) 1 MG tablet Take 1 tablet twice a day for 3 days and then twice a day as needed 20 tablet 1  ? predniSONE (DELTASONE) 2.5 MG tablet Take 1 tablet (2.5 mg total) by mouth daily with breakfast. 30 tablet 1  ? sucralfate (CARAFATE) 1 GM/10ML suspension Take 10 mLs (1 g total) by mouth 4 (four) times daily. (Patient taking differently: Take 1 g by mouth as needed.) 420 mL 1  ? valACYclovir (VALTREX) 1000 MG tablet Take 1 tablet (1000 mg) by mouth 2 times daily for 5 days then as needed for mouth sores 30 tablet 0  ? VITAMIN D PO Take 1,000 Units by mouth daily.    ? ?No current facility-administered medications for this visit.  ?  ? ?ALLERGIES: Cephalexin, Penicillins, Amitiza [lubiprostone], and Sulfa antibiotics ? ?Family History  ?Problem Relation Age of Onset  ? Alcohol abuse Mother   ? Cancer Father   ?     Lung and Colon  ? Heart attack Father   ? COPD Father   ? Diabetes Maternal Grandmother   ? Cervical cancer Maternal Grandmother   ? Fibromyalgia Maternal Grandmother   ? Prostate cancer Maternal Grandfather   ? Heart attack Maternal Grandfather   ? ALS Paternal Grandmother   ? Prostate cancer Paternal Grandfather   ? Heart attack Paternal Grandfather   ? Esophageal cancer Paternal Grandfather   ? Breast cancer Other   ? Colon cancer Neg Hx   ? Stomach cancer Neg Hx   ? Rectal cancer Neg Hx   ? ? ?Social History  ? ?Socioeconomic History  ? Marital status: Single  ?  Spouse name:  Not on file  ? Number of children: Not on file  ? Years of education: Not on file  ? Highest education level: Not on file  ?Occupational History  ? Not on file  ?Tobacco Use  ? Smoking status: Never  ?  Smokeless tobacco: Never  ?Vaping Use  ? Vaping Use: Never used  ?Substance and Sexual Activity  ? Alcohol use: Not Currently  ? Drug use: Never  ? Sexual activity: Yes  ?  Birth control/protection: Surgical  ?  Comment: partner with vasectomy  ?Other Topics Concern  ? Not on file  ?Social History Narrative  ? Not on file  ? ?Social Determinants of Health  ? ?Financial Resource Strain: Not on file  ?Food Insecurity: Not on file  ?Transportation Needs: Not on file  ?Physical Activity: Not on file  ?Stress: Not on file  ?Social Connections: Not on file  ?Intimate Partner Violence: Not on file  ? ? ?Review of Systems  ?All other systems reviewed and are negative. ? ?PHYSICAL EXAMINATION:   ? ?BP 110/74   Pulse 72   Ht '5\' 7"'$  (1.702 m)   Wt 198 lb (89.8 kg)   LMP 10/10/2021   SpO2 99%   BMI 31.01 kg/m?     ?General appearance: alert, cooperative and appears stated age ?Abdomen: soft, non-tender; non distended, no masses,  no organomegaly ? ?Pelvic: External genitalia:  no lesions ?             Urethra:  normal appearing urethra with no masses, tenderness or lesions ?             Bartholins and Skenes: normal    ?             Vagina: normal appearing vagina with normal color and discharge, no lesions ? Vaginal cuff: appears well healed. ?             Cervix: absent ?             Bimanual Exam:  Uterus:  uterus absent ?             Adnexa: no mass, fullness, tenderness ?              ?Chaperone was present for exam. ? ?1. Vaginal cuff dehiscence, subsequent encounter ?Doing well ?Pelvic rest for 12 weeks s/p dehiscence ?Call with any concerns ? ? ?

## 2022-04-06 ENCOUNTER — Encounter: Payer: Self-pay | Admitting: Internal Medicine

## 2022-04-06 ENCOUNTER — Telehealth: Payer: Self-pay | Admitting: Pharmacist

## 2022-04-06 ENCOUNTER — Encounter: Payer: Self-pay | Admitting: Orthopedic Surgery

## 2022-04-06 ENCOUNTER — Ambulatory Visit: Payer: BC Managed Care – PPO | Admitting: Internal Medicine

## 2022-04-06 VITALS — BP 110/75 | HR 93 | Resp 15 | Ht 67.0 in | Wt 198.0 lb

## 2022-04-06 DIAGNOSIS — R21 Rash and other nonspecific skin eruption: Secondary | ICD-10-CM

## 2022-04-06 DIAGNOSIS — Z79899 Other long term (current) drug therapy: Secondary | ICD-10-CM | POA: Diagnosis not present

## 2022-04-06 DIAGNOSIS — M329 Systemic lupus erythematosus, unspecified: Secondary | ICD-10-CM | POA: Diagnosis not present

## 2022-04-06 DIAGNOSIS — D72829 Elevated white blood cell count, unspecified: Secondary | ICD-10-CM

## 2022-04-06 NOTE — Telephone Encounter (Signed)
Received fax from [facility] regarding Saphnelo infusion that patient received on 04/06/22. ? ?Labs were not drawn. ? ?Patient tolerated infusion without complications.  ?Changes/concerns since last visit: none visited ? ?Next Saphnelo infusion scheduled for 05/03/22 ? ?Knox Saliva, PharmD, MPH, BCPS, CPP ?Clinical Pharmacist (Rheumatology and Pulmonology)  ?

## 2022-04-06 NOTE — Progress Notes (Signed)
? ?Office Visit Note ?  ?Patient: Christy Lowe. Life           ?Date of Birth: [birth year]           ?MRN: [id number] ?Visit Date: 04/05/2022 ?Requested by: Horald Pollen, MD ?[address] ?[place],   [id number] ?PCP: Horald Pollen, MD ? ?Subjective: ?Chief Complaint  ?Patient presents with  ? Left Knee - Pain  ? ? 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  She has to travel a lot to [place] as part of her job.  Riding horses is her primary recreational duty.  She had prior ACL reconstruction 3-1/2 years ago in [place].  This was performed with allograft.  Meniscal repair also performed at that time.  Recovery was exceedingly difficult due to the weight bearing restrictions.  She was doing well until several months ago when she was getting onto a horse and she felt a twinge/pop in her knee.  Subsequent work-up demonstrates tearing of the ACL graft with symptomatic instability of the knee primarily in extension.  Patient does live in [place] but works in [place].  Not taking any medication for pain.  Patient was completely compliant with rehabilitation restrictions and she states it took her about a year to really get over the last surgery. ? ?Patient has had an MRI scan which is reviewed.  Does show Endobutton fixation of what appears to be soft tissue allograft with appropriately placed femoral and tibial tunnels.  No obvious meniscal pathology is visible in the medial or lateral compartment. ? ?Importantly the patient also has a history of lupus and takes an infusion every month along with Plaquenil and hydroxyzine as well as prednisone 10 mg/day.  Patient still has not healed from abdominal surgery performed months ago.  She denies any fevers or chills.  Patient states she cannot run and relays a story where she was trying to help a fellow horse rider who had been gravely injured but could not really get to the scene quickly enough because of her  knee. ?             ?ROS: All systems reviewed are negative as they relate to the chief complaint within the history of present illness.  Patient denies  fevers or chills. ? ? ?Assessment & Plan: ?Visit Diagnoses: No diagnosis found. ? 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  Patient is having symptomatic instability with her knee.  At this time I think her best option would be revision ACL reconstruction using bone patellar tendon bone autograft.  She does have an incision at the distal pole of her patella from prior meniscal repair.  We will plan to use CPM machine postoperatively and with no meniscal pathology apparent anticipate weightbearing immediately after surgery.  She is at high risk of infection due to her immunosuppression.  She also has a history of DVT and pulmonary embolism and we would likely put her on Xarelto postoperatively.  She has a very busy business schedule and may or may not be able to do this anytime soon.  Continue to work on quad and hamstring strengthening exercises primarily through stationary bike to maintain muscle integrity around the knee.  [name]'s card is provided.  She will call if she wants to schedule. ? ?Follow-Up Instructions: No follow-ups on file.  ? ?Orders:  ?No orders of the defined types were placed in this encounter. ? ?No orders of the defined types were  placed in this encounter. ? ? ? ? Procedures: ?No procedures performed ? ? ?Clinical Data: ?No additional findings. ? ?Objective: ?Vital Signs: LMP 10/10/2021  ? ?Physical Exam:  ? ?Constitutional: Patient appears well-developed ?HEENT:  ?Head: Normocephalic ?Eyes:EOM are normal ?Neck: Normal range of motion ?Cardiovascular: Normal rate ?Pulmonary/chest: Effort normal ?Neurologic: Patient is alert ?Skin: Skin is warm ?Psychiatric: Patient has normal mood and affect ? ? ?Ortho Exam: Ortho exam demonstrates good range of motion of the left knee from full extension to 125 of flexion.  Collaterals are  stable to varus and valgus stress at 0 and 30 degrees.  Extensor mechanism intact.  ACL laxity is present with positive anterior drawer positive Lachman.  There is no posterior lateral rotatory instability present.  Pedal pulses palpable.  Extensor mechanism intact.  Negative Homans no calf tenderness today. ? ?Specialty Comments:  ?No specialty comments available. ? ?Imaging: ?No results found. ? ? ?PMFS History: ?Patient Active Problem List  ? Diagnosis Date Noted  ? Leukocytosis 03/06/2022  ? Medication side effect 02/20/2022  ? Situational anxiety 02/20/2022  ? S/P laparoscopic hysterectomy 10/31/2021  ? Itching 09/09/2021  ? High risk medication use 07/25/2021  ? Finger joint swelling, left 04/01/2021  ? Systemic lupus erythematosus ([place]) 04/01/2021  ? Fibromyalgia 04/01/2021  ? Alopecia 11/17/2020  ? Other fatigue 10/12/2020  ? Migraine without aura   ? History of DVT (deep vein thrombosis)   ? History of endometriosis   ? Right-sided muscle weakness 05/15/2019  ? Abnormal finding on MRI of brain 05/15/2019  ? 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 Tremor 12/24/2017  ? ?Past Medical History:  ?Diagnosis Date  ? aub   ? Bruises easily 10/24/2021  ? Complication of anesthesia 10/15/2020  ? hypotension  ? Fibromyalgia   ? GERD (gastroesophageal reflux disease)   ? Heart murmur  PFO   ? off 81 mg aspirin since feb 2022 per pcp  no cardiologist  ? Hemangioma   ? History of DVT (deep vein thrombosis)   ? right le 7 yrs ago, right leg slight swollen  ? History of endometriosis   ? History of hiatal hernia   ? Hypotension   ? with 10-15-2020 endoscopy  ? Migraine without aura   ? Systemic lupus erythematosus ([place])   ? TIA   ? ages 87 and 15  ? Viral meningitis   ? age 87  ?  ?Family History  ?Problem Relation Age of Onset  ? Alcohol abuse Mother   ? Cancer Father   ?     Lung and Colon  ? Heart attack Father   ? COPD Father   ? Diabetes Maternal Grandmother   ? Cervical cancer Maternal Grandmother   ? Fibromyalgia  Maternal Grandmother   ? Prostate cancer Maternal Grandfather   ? Heart attack Maternal Grandfather   ? ALS Paternal Grandmother   ? Prostate cancer Paternal Grandfather   ? Heart attack Paternal Grandfather   ? Esophageal cancer Paternal Grandfather   ? Breast cancer Other   ? Colon cancer Neg Hx   ? Stomach cancer Neg Hx   ? Rectal cancer Neg Hx   ?  ?Past Surgical History:  ?Procedure Laterality Date  ? ABDOMINAL HYSTERECTOMY    ? ANTERIOR CRUCIATE LIGAMENT REPAIR Left 10/2018  ? CYSTOSCOPY  10/31/2021  ? Procedure: CYSTOSCOPY;  Surgeon: Salvadore Dom, MD;  Location: [facility];  Service: Gynecology;;  ? PELVIC LAPAROSCOPY    ? for endometriosis  yrs ago per pt on 10-24-2021  ? right knee lateral release and meniscal repair  2011  ? TONSILLECTOMY    ? age 23, adenoids removed also  ? TOTAL LAPAROSCOPIC HYSTERECTOMY WITH SALPINGECTOMY Bilateral 10/31/2021  ? Procedure: TOTAL LAPAROSCOPIC HYSTERECTOMY WITH SALPINGECTOMY;  Surgeon: Salvadore Dom, MD;  Location: [facility];  Service: Gynecology;  Laterality: Bilateral;  ? UMBILICAL HERNIA REPAIR    ? yrs ago  ? UPPER GI ENDOSCOPY    ? with esophagus stretching 10-15-2020, 03-11-2020, 05-09-2019,  2013 with being polyp removed  ? ?Social History  ? ?Occupational History  ? Not on file  ?Tobacco Use  ? Smoking status: Never  ? Smokeless tobacco: Never  ?Vaping Use  ? Vaping Use: Never used  ?Substance and Sexual Activity  ? Alcohol use: Not Currently  ? Drug use: Never  ? Sexual activity: Yes  ?  Birth control/protection: Surgical  ?  Comment: partner with vasectomy  ? ? ? ? ? ?

## 2022-04-07 LAB — COMPLETE METABOLIC PANEL WITH GFR
AG Ratio: 1.9 (calc) (ref 1.0–2.5)
ALT: 19 U/L (ref 6–29)
AST: 24 U/L (ref 10–30)
Albumin: 4.4 g/dL (ref 3.6–5.1)
Alkaline phosphatase (APISO): 54 U/L (ref 31–125)
BUN: 13 mg/dL (ref 7–25)
CO2: 26 mmol/L (ref 20–32)
Calcium: 9.5 mg/dL (ref 8.6–10.2)
Chloride: 104 mmol/L (ref 98–110)
Creat: 0.95 mg/dL (ref 0.50–0.97)
Globulin: 2.3 g/dL (calc) (ref 1.9–3.7)
Glucose, Bld: 73 mg/dL (ref 65–99)
Potassium: 4.1 mmol/L (ref 3.5–5.3)
Sodium: 139 mmol/L (ref 135–146)
Total Bilirubin: 0.5 mg/dL (ref 0.2–1.2)
Total Protein: 6.7 g/dL (ref 6.1–8.1)
eGFR: 79 mL/min/{1.73_m2} (ref >=60)

## 2022-04-07 LAB — CBC WITH DIFFERENTIAL/PLATELET
Absolute Monocytes: 683 cells/uL (ref 200–950)
Basophils Absolute: 48 cells/uL (ref 0–200)
Basophils Relative: 0.7 %
Eosinophils Absolute: 228 cells/uL (ref 15–500)
Eosinophils Relative: 3.3 %
HCT: 40.8 % (ref 35.0–45.0)
Hemoglobin: 13.9 g/dL (ref 11.7–15.5)
Lymphs Abs: 1842 cells/uL (ref 850–3900)
MCH: 30.5 pg (ref 27.0–33.0)
MCHC: 34.1 g/dL (ref 32.0–36.0)
MCV: 89.7 fL (ref 80.0–100.0)
MPV: 9.3 fL (ref 7.5–12.5)
Monocytes Relative: 9.9 %
Neutro Abs: 4099 cells/uL (ref 1500–7800)
Neutrophils Relative %: 59.4 %
Platelets: 286 10*3/uL (ref 140–400)
RBC: 4.55 10*6/uL (ref 3.80–5.10)
RDW: 12.6 % (ref 11.0–15.0)
Total Lymphocyte: 26.7 %
WBC: 6.9 10*3/uL (ref 3.8–10.8)

## 2022-04-07 LAB — ANTI-DNA ANTIBODY, DOUBLE-STRANDED: ds DNA Ab: 14 IU/mL — ABNORMAL HIGH

## 2022-04-10 ENCOUNTER — Other Ambulatory Visit: Payer: Self-pay | Admitting: Internal Medicine

## 2022-04-10 DIAGNOSIS — M329 Systemic lupus erythematosus, unspecified: Secondary | ICD-10-CM

## 2022-04-10 NOTE — Progress Notes (Signed)
Lab results look okay I don't see any problems with the saphnelo or need to change plan.

## 2022-04-13 NOTE — Telephone Encounter (Signed)
I spoke with patient to remind her about the breast imaging she needs. She is very frustrated because [facility] doesn't want to schedule without films from [place] but she states she has been unable to get that facility to send those to [facility].

## 2022-04-13 NOTE — Telephone Encounter (Signed)
I called and spoke with rep at [facility].  They are fine with going ahead and scheduling patient since she is unable to obtain films. She will just have to know that they have nothing available for comparison.  I went ahead and scheduled her first available on Friday May 26 at 10:50am to check in 15 minutes early.  I called her back but went to voice mail and her voice mail box is full.

## 2022-04-13 NOTE — Telephone Encounter (Signed)
I sent patient a My Chart message and let her know that [facility] was willing to schedule her without the previous films. I took the first available appt 04/21/22 at 10:50am. I asked her to call and r/s it if it does not work for her. [facility] diagnostic scheduling line phone number provided to her.

## 2022-04-21 ENCOUNTER — Other Ambulatory Visit: Payer: BC Managed Care – PPO

## 2022-04-28 NOTE — Progress Notes (Signed)
Office Visit Note  Patient: [name]             Date of Birth: 1981-05-01           MRN: [id number]             PCP: Horald Pollen, MD Referring: Horald Pollen, * Visit Date: 05/01/2022   Subjective:  Follow-up (Improving, right wrist and hand thickening, bruising)   History of Present Illness: Christy Lowe is a 41 y.o. female here for follow up for lupus on HCQ 400 mg daily started saphnelo infusion and prednisone tapering down to 2.5 mg daily now. So far she is noticing some improvement with overall severity of skin redness swelling and itching but definitely not resolved.  Face and chest continue having the most erythema.  She has noticed some bruising without any specific cause.  She is noticing some swelling or otherwise thickening in the joints of her right hand and around the right wrist.   Previous HPI 04/06/2022 Christy Lowe is a 41 y.o. female here for follow up for lupus on HCQ 400 mg daily started saphnelo infusion last month and prednisone tapering down to 2.5 mg daily now. She felt a large improvement after her first treatment with decreased joint pains and decreased abdominal pain. She is tapering off the prednisone without severe problem. She has noticed oral ulcers on the top of her mouth for several days and is having a rash with small papules on both sides of her neck and lower face. Appetite is decreased but not losing any weight. She feels a lot of stress after dealing with work incident.   Previous HPI 03/06/2022 Christy Lowe is a 41 y.o. female here for follow up for lupus on HCQ 400 mg daily and prednisone tapering down to 10 mg daily after increase to 30 last month from flare up with mouth ulcers and diffuse rashes. She had several complications with high dose prednisone increased weight, mood irritability, and wound dehiscence af hysterectomy cuff site. We discussed plan to start saphenlo infusion as a replacement for benlysta  treatment which did not improve symptoms or lab abnormalities much after about 6 months treatment.  She has some improvement in the mouth and lip ulcers she is not sure whether starting the valacyclovir about 4 days ago has had significant effect.  She completed the course of oral antibiotics abdominal pain is partially better. She is scheduled for first saphnelo infusion Thursday and has OBGYN followup later this week.       Previous HPI 12/13/21 Christy Lowe is a 41 y.o. female here for follow up for systemic lupus with ongoing joint pains and skin rash on HCQ 400 mg daily, Benlysta 200 mg  weekly, and prednisone 10 mg daily. Since our last visit she went for hysterectomy this was uneventful and has recovered well. She was off the benlysta for about 1 month in total around the surgery. She has not felt much improvement so far still having a lot of fatigue, skin rash and flushing, joint pain in several areas with left wrist swelling. Headaches are about the same as before but now having recurrent episodes of vertigo. She is now taking hydroxyzine twice on some days due to persistent itching, and feels excessively hot most of the time. She Is back to taking 10 mg of prednisone when trying to drop this to 5 mg felt extreme fatigue and back pain that felt like bruising and stiffness  all over her body.   Previous HPI 11/17/20 Christy Lowe is a 42 y.o. female here for evaluation of positive ANA. She is noticing multiple symptoms including generalized alopecia, oral dryness, left sided neck swelling, joint pain and stiffness, position tremor, and episodic skin rashes. She has had multiple ongoing problems since many years ago. She was hospitalized for viral meningitis at age 54 and feels she has been susceptible to infections ever since this time or before, with numerous cases of bronchitis and has had some lung nodules on CT imaging during these repeat episodes. About 7 years ago she suffered from  a RLE DVT provoked after right knee arthroscopy and developed right sided face and arm numbness. Workup indicated she had a TIA also noted to have PFO. She takes ASA since that time and has taken anticoagulation in perioperative periods. Workup with MRI and LP studies were apparently also indicative for MS. She was treated with multiple rounds of steroids for months at a time and felt a lot of mood disturbance from these. She was never started on other immunomodulatory or immunosuppressive treatments.   09/2020 ANA positive dsDNA 24     Review of Systems  Constitutional:  Positive for fatigue and weight gain.  HENT:  Positive for mouth dryness.   Eyes:  Negative for dryness.  Respiratory:  Negative for shortness of breath.   Cardiovascular:  Positive for swelling in legs/feet.  Gastrointestinal:  Negative for constipation.  Endocrine: Positive for heat intolerance.  Genitourinary:  Negative for difficulty urinating.  Musculoskeletal:  Positive for joint pain, joint pain, joint swelling and morning stiffness.  Skin:  Positive for redness.  Allergic/Immunologic: Negative for susceptible to infections.  Neurological:  Negative for numbness.  Hematological:  Positive for bruising/bleeding tendency.  Psychiatric/Behavioral:  Negative for sleep disturbance.    PMFS History:  Patient Active Problem List   Diagnosis Date Noted   Rash and other nonspecific skin eruption 04/06/2022   Leukocytosis 03/06/2022   Medication side effect 02/20/2022   Situational anxiety 02/20/2022   S/P laparoscopic hysterectomy 10/31/2021   Itching 09/09/2021   High risk medication use 07/25/2021   Finger joint swelling, left 04/01/2021   Systemic lupus erythematosus ([place]) 04/01/2021   Fibromyalgia 04/01/2021   Alopecia 11/17/2020   Other fatigue 10/12/2020   Migraine without aura    History of DVT (deep vein thrombosis)    History of endometriosis    Right-sided muscle weakness 05/15/2019   Abnormal  finding on MRI of brain 05/15/2019   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   Tremor 12/24/2017    Past Medical History:  Diagnosis Date   aub    Bruises easily 29/52/8413   Complication of anesthesia 10/15/2020   hypotension   Fibromyalgia    GERD (gastroesophageal reflux disease)    Heart murmur  PFO    off 81 mg aspirin since feb 2022 per pcp  no cardiologist   Hemangioma    History of DVT (deep vein thrombosis)    right le 7 yrs ago, right leg slight swollen   History of endometriosis    History of hiatal hernia    Hypotension    with 10-15-2020 endoscopy   Migraine without aura    Systemic lupus erythematosus ([place])    TIA    ages 34 and 30   Viral meningitis    age 5    Family History  Problem Relation Age of Onset   Alcohol abuse Mother    Cancer Father  Lung and Colon   Heart attack Father    COPD Father    Diabetes Maternal Grandmother    Cervical cancer Maternal Grandmother    Fibromyalgia Maternal Grandmother    Prostate cancer Maternal Grandfather    Heart attack Maternal Grandfather    ALS Paternal Grandmother    Prostate cancer Paternal Grandfather    Heart attack Paternal Grandfather    Esophageal cancer Paternal Grandfather    Breast cancer Other    Colon cancer Neg Hx    Stomach cancer Neg Hx    Rectal cancer Neg Hx    Past Surgical History:  Procedure Laterality Date   ABDOMINAL HYSTERECTOMY     ANTERIOR CRUCIATE LIGAMENT REPAIR Left 10/2018   CYSTOSCOPY  10/31/2021   Procedure: CYSTOSCOPY;  Surgeon: Salvadore Dom, MD;  Location: [facility];  Service: Gynecology;;   PELVIC LAPAROSCOPY     for endometriosis yrs ago per pt on 10-24-2021   right knee lateral release and meniscal repair  2011   TONSILLECTOMY     age 89, adenoids removed also   TOTAL LAPAROSCOPIC HYSTERECTOMY WITH SALPINGECTOMY Bilateral 10/31/2021   Procedure: TOTAL LAPAROSCOPIC HYSTERECTOMY WITH SALPINGECTOMY;  Surgeon: Salvadore Dom, MD;   Location: [place];  Service: Gynecology;  Laterality: Bilateral;   UMBILICAL HERNIA REPAIR     yrs ago   UPPER GI ENDOSCOPY     with esophagus stretching 10-15-2020, 03-11-2020, 05-09-2019,  2013 with being polyp removed   Social History   Social History Narrative   Not on file   Immunization History  Administered Date(s) Administered   PFIZER(Purple Top)SARS-COV-2 Vaccination 06/27/2020, 07/28/2020   PPD Test 01/23/2019     Objective: Vital Signs: BP 115/77 (BP Location: Left Arm, Patient Position: Sitting, Cuff Size: Normal)   Pulse 85   Resp 15   Ht '5\' 7"'$  (1.702 m)   Wt 197 lb (89.4 kg)   LMP 10/10/2021   BMI 30.85 kg/m    Physical Exam HENT:     Mouth/Throat:     Mouth: Mucous membranes are moist.     Pharynx: Oropharynx is clear.  Eyes:     Conjunctiva/sclera: Conjunctivae normal.  Cardiovascular:     Rate and Rhythm: Normal rate and regular rhythm.  Pulmonary:     Effort: Pulmonary effort is normal.     Breath sounds: Normal breath sounds.  Musculoskeletal:     Right lower leg: No edema.     Left lower leg: No edema.  Skin:    General: Skin is warm and dry.     Findings: Rash present.     Comments: Blanching erythema on face and upper chest without papules or lesions Small raised erythematous bumps along and underneath jaw and upper neck on sides, some unroofed Left forearm bruising  Neurological:     Mental Status: She is alert.  Psychiatric:        Mood and Affect: Mood normal.      Musculoskeletal Exam:  Shoulders full ROM no tenderness or swelling Elbows full ROM no tenderness or swelling Mild right wrist tenderness no palpable effusion, skin tightness on palms bilaterally with no overlying skin changes Knees full ROM no tenderness or swelling   Investigation: No additional findings.  Imaging: No results found.  Recent Labs: Lab Results  Component Value Date   WBC 6.9 04/06/2022   HGB 13.9 04/06/2022   PLT 286  04/06/2022   NA 139 04/06/2022   K 4.1 04/06/2022  CL 104 04/06/2022   CO2 26 04/06/2022   GLUCOSE 73 04/06/2022   BUN 13 04/06/2022   CREATININE 0.95 04/06/2022   BILITOT 0.5 04/06/2022   ALKPHOS 47 10/26/2021   AST 24 04/06/2022   ALT 19 04/06/2022   PROT 6.7 04/06/2022   ALBUMIN 4.9 10/26/2021   CALCIUM 9.5 04/06/2022   GFRAA >60 03/10/2020   QFTBGOLDPLUS NEGATIVE 07/25/2021    Speciality Comments: [facility] 08/17/2021 WNL Follow-up: 12 months   Procedures:  No procedures performed Allergies: Cephalexin, Penicillins, Amitiza [lubiprostone], and Sulfa antibiotics   Assessment / Plan:     Visit Diagnoses: Systemic lupus erythematosus, unspecified SLE type, unspecified organ involvement status ([place])  Symptoms appear to be slightly improving or at least tolerated during prednisone tapering but the Saphnelo infusion.  She still relatively early on treatment.  Plan to continue the hydroxychloroquine 400 mg daily Saphnelo 300 mg IV monthly and to taper off the remaining low-dose prednisone.  High risk medication use - Saphnelo 300 mg IV monthly, hydroxychloroquine 400 mg daily, and prednisone 2.5 mg daily.  Tolerating the Saphnelo without major problems although she does report a substantial loss of appetite with the infusions.  Recent labs checked were stable for medication monitoring.  Still has some bruising and weight is up but now down to low prednisone dose.  Rash and other nonspecific skin eruption  Still active symptoms but improving with Saphnelo infusion start.  Orders: No orders of the defined types were placed in this encounter.  No orders of the defined types were placed in this encounter.    Follow-Up Instructions: No follow-ups on file.   Collier Salina, MD  Note - This record has been created using [employer].  Chart creation errors have been sought, but may not always  have been located. Such creation errors do  not reflect on  the standard of medical care.

## 2022-05-01 ENCOUNTER — Encounter: Payer: Self-pay | Admitting: Internal Medicine

## 2022-05-01 ENCOUNTER — Ambulatory Visit (INDEPENDENT_AMBULATORY_CARE_PROVIDER_SITE_OTHER): Payer: BC Managed Care – PPO | Admitting: Internal Medicine

## 2022-05-01 VITALS — BP 115/77 | HR 85 | Resp 15 | Ht 67.0 in | Wt 197.0 lb

## 2022-05-01 DIAGNOSIS — Z79899 Other long term (current) drug therapy: Secondary | ICD-10-CM

## 2022-05-01 DIAGNOSIS — M329 Systemic lupus erythematosus, unspecified: Secondary | ICD-10-CM

## 2022-05-01 DIAGNOSIS — D72829 Elevated white blood cell count, unspecified: Secondary | ICD-10-CM

## 2022-05-01 DIAGNOSIS — R21 Rash and other nonspecific skin eruption: Secondary | ICD-10-CM

## 2022-05-03 DIAGNOSIS — M329 Systemic lupus erythematosus, unspecified: Secondary | ICD-10-CM | POA: Diagnosis not present

## 2022-05-04 ENCOUNTER — Telehealth: Payer: Self-pay | Admitting: Pharmacist

## 2022-05-04 NOTE — Telephone Encounter (Signed)
Received fax from [facility] regarding Saphnelo infusion that patient received on 05/03/22. Labs were not drawn.  Patient tolerated infusion without complications.  Changes/concerns since last visit: none  Next Saphnelo infusion scheduled for 06/01/22  Knox Saliva, PharmD, MPH, BCPS, CPP Clinical Pharmacist (Rheumatology and Pulmonology)

## 2022-05-16 ENCOUNTER — Ambulatory Visit
Admission: RE | Admit: 2022-05-16 | Discharge: 2022-05-16 | Disposition: A | Discharge status: Home / Self Care | Payer: BC Managed Care – PPO | Source: Ambulatory Visit | Attending: Obstetrics and Gynecology | Admitting: Obstetrics and Gynecology

## 2022-05-16 ENCOUNTER — Other Ambulatory Visit: Payer: Self-pay | Admitting: Obstetrics and Gynecology

## 2022-05-16 DIAGNOSIS — R922 Inconclusive mammogram: Secondary | ICD-10-CM | POA: Diagnosis not present

## 2022-05-16 DIAGNOSIS — N6489 Other specified disorders of breast: Secondary | ICD-10-CM | POA: Diagnosis not present

## 2022-05-16 DIAGNOSIS — N6310 Unspecified lump in the right breast, unspecified quadrant: Secondary | ICD-10-CM

## 2022-05-16 DIAGNOSIS — N6315 Unspecified lump in the right breast, overlapping quadrants: Secondary | ICD-10-CM

## 2022-05-18 DIAGNOSIS — L299 Pruritus, unspecified: Secondary | ICD-10-CM | POA: Diagnosis not present

## 2022-05-18 DIAGNOSIS — L719 Rosacea, unspecified: Secondary | ICD-10-CM | POA: Diagnosis not present

## 2022-05-18 DIAGNOSIS — L300 Nummular dermatitis: Secondary | ICD-10-CM | POA: Diagnosis not present

## 2022-05-23 ENCOUNTER — Ambulatory Visit (INDEPENDENT_AMBULATORY_CARE_PROVIDER_SITE_OTHER): Payer: BC Managed Care – PPO | Admitting: Emergency Medicine

## 2022-05-23 ENCOUNTER — Encounter: Payer: Self-pay | Admitting: Emergency Medicine

## 2022-05-23 VITALS — BP 112/76 | HR 88 | Temp 98.2°F | Ht 67.0 in | Wt 199.4 lb

## 2022-05-23 DIAGNOSIS — R739 Hyperglycemia, unspecified: Secondary | ICD-10-CM

## 2022-05-23 DIAGNOSIS — M329 Systemic lupus erythematosus, unspecified: Secondary | ICD-10-CM

## 2022-05-23 DIAGNOSIS — E669 Obesity, unspecified: Secondary | ICD-10-CM

## 2022-05-23 DIAGNOSIS — Z7952 Long term (current) use of systemic steroids: Secondary | ICD-10-CM

## 2022-05-23 LAB — HEMOGLOBIN A1C: Hgb A1c MFr Bld: 5.5 % (ref 4.6–6.5)

## 2022-05-23 NOTE — Assessment & Plan Note (Signed)
Was able to completely wean off prednisone.  Presently off it. Concerned about osteoporosis and frequent "bone bruising" DEXA scan requested today.

## 2022-05-28 ENCOUNTER — Other Ambulatory Visit: Payer: Self-pay | Admitting: Internal Medicine

## 2022-05-28 DIAGNOSIS — M359 Systemic involvement of connective tissue, unspecified: Secondary | ICD-10-CM

## 2022-05-31 DIAGNOSIS — M329 Systemic lupus erythematosus, unspecified: Secondary | ICD-10-CM | POA: Diagnosis not present

## 2022-06-01 ENCOUNTER — Other Ambulatory Visit: Payer: Self-pay | Admitting: Internal Medicine

## 2022-06-01 ENCOUNTER — Telehealth: Payer: Self-pay | Admitting: Pharmacist

## 2022-06-01 NOTE — Telephone Encounter (Signed)
Please schedule patient a follow up visit. Patient due September 2023. Thanks!  

## 2022-06-01 NOTE — Telephone Encounter (Signed)
LMOM for patient to schedule follow up appointment. °

## 2022-06-01 NOTE — Telephone Encounter (Signed)
Next Visit: Due September 2023. Message sent to the front to schedule patient   Last Visit: 05/01/2022  Last Fill: 03/24/2022  Dx: Systemic lupus erythematosus, unspecified SLE type, unspecified organ involvement status   Current Dose per office note on 05/01/2022: not discussed   Okay to refill Flexeril?

## 2022-06-01 NOTE — Telephone Encounter (Signed)
Received fax from [facility] regarding Saphnelo infusion that patient received on 05/31/22. Labs were not drawn.  Patient tolerated infusion without complications.  Changes/concerns since last visit:  NONE  Next Saphnelo infusion scheduled for 06/29/22  Knox Saliva, PharmD, MPH, BCPS, CPP Clinical Pharmacist (Rheumatology and Pulmonology)

## 2022-06-05 ENCOUNTER — Encounter: Payer: Self-pay | Admitting: Emergency Medicine

## 2022-06-20 ENCOUNTER — Other Ambulatory Visit: Payer: Self-pay | Admitting: Emergency Medicine

## 2022-06-20 ENCOUNTER — Other Ambulatory Visit: Payer: Self-pay | Admitting: Internal Medicine

## 2022-06-20 DIAGNOSIS — Z13820 Encounter for screening for osteoporosis: Secondary | ICD-10-CM

## 2022-06-20 DIAGNOSIS — L299 Pruritus, unspecified: Secondary | ICD-10-CM

## 2022-06-20 NOTE — Telephone Encounter (Signed)
Next Visit: 08/09/2022  Last Visit: 05/01/2022  Last Fill: 02/15/2022  Dx: Rash and other nonspecific skin eruption  Current Dose per office note on 05/01/2022: not discussed   Okay to refill Hydroxyzine?

## 2022-06-27 ENCOUNTER — Ambulatory Visit (INDEPENDENT_AMBULATORY_CARE_PROVIDER_SITE_OTHER)
Admission: RE | Admit: 2022-06-27 | Discharge: 2022-06-27 | Disposition: A | Discharge status: Home / Self Care | Payer: BC Managed Care – PPO | Source: Ambulatory Visit | Attending: Emergency Medicine | Admitting: Emergency Medicine

## 2022-06-27 DIAGNOSIS — Z13820 Encounter for screening for osteoporosis: Secondary | ICD-10-CM

## 2022-06-29 ENCOUNTER — Telehealth: Payer: Self-pay | Admitting: Pharmacist

## 2022-06-29 DIAGNOSIS — M329 Systemic lupus erythematosus, unspecified: Secondary | ICD-10-CM | POA: Diagnosis not present

## 2022-06-29 NOTE — Telephone Encounter (Signed)
Received fax from [facility] regarding Saphnelo infusion that patient received on 06/26/22. Labs were not drawn.  Patient tolerated infusion without complications.  Changes/concerns since last visit: NONE  Next Saphnelo infusion scheduled for 07/27/22  Knox Saliva, PharmD, MPH, BCPS, CPP Clinical Pharmacist (Rheumatology and Pulmonology)

## 2022-06-30 ENCOUNTER — Other Ambulatory Visit: Payer: Self-pay | Admitting: Internal Medicine

## 2022-06-30 NOTE — Telephone Encounter (Signed)
Next Visit: 08/09/2022  Last Visit: 05/01/2022  Last Fill: 07/07/20223  Dx: lupus  Current Dose per office note on 06/02/2022: FLEXERIL 10 MG TAB BY MOUTH AT BEDTIME AS NEEDED FOR MUSCLE SPASMS. 30 day supply with 2 refills.  Okay to refill FLEXERIL?

## 2022-07-03 DIAGNOSIS — H5203 Hypermetropia, bilateral: Secondary | ICD-10-CM | POA: Diagnosis not present

## 2022-07-03 DIAGNOSIS — Z79899 Other long term (current) drug therapy: Secondary | ICD-10-CM | POA: Diagnosis not present

## 2022-07-03 DIAGNOSIS — H52203 Unspecified astigmatism, bilateral: Secondary | ICD-10-CM | POA: Diagnosis not present

## 2022-07-03 NOTE — Telephone Encounter (Signed)
Next Visit: 08/09/2022  Last Visit: 05/01/2022  Last Fill: 06/30/2022  Dx: lupus   Current Dose per office note on 05/01/2022: 10 mg 1 tablet every night PRN  Okay to refill Flexeril?

## 2022-07-04 DIAGNOSIS — L209 Atopic dermatitis, unspecified: Secondary | ICD-10-CM | POA: Diagnosis not present

## 2022-07-04 DIAGNOSIS — L649 Androgenic alopecia, unspecified: Secondary | ICD-10-CM | POA: Diagnosis not present

## 2022-07-12 ENCOUNTER — Telehealth: Payer: Self-pay | Admitting: Pharmacist

## 2022-07-12 NOTE — Telephone Encounter (Signed)
Error

## 2022-07-14 ENCOUNTER — Other Ambulatory Visit: Payer: Self-pay | Admitting: Internal Medicine

## 2022-07-14 DIAGNOSIS — M359 Systemic involvement of connective tissue, unspecified: Secondary | ICD-10-CM

## 2022-07-14 NOTE — Telephone Encounter (Signed)
Next Visit: 08/09/2022  Last Visit: 05/01/2022  Labs: 04/06/2022  CBC WNL CMP WNL  Eye exam: [facility] 07/03/2022 WNL   Current Dose per office note 05/01/2022: hydroxychloroquine 400 mg daily  [id number] lupus erythematosus, unspecified SLE type, unspecified organ involvement status   Last Fill: 12/13/2021  Okay to refill Plaquenil?

## 2022-07-25 ENCOUNTER — Telehealth: Payer: Self-pay | Admitting: Pharmacist

## 2022-07-25 DIAGNOSIS — M329 Systemic lupus erythematosus, unspecified: Secondary | ICD-10-CM | POA: Diagnosis not present

## 2022-07-25 NOTE — Telephone Encounter (Signed)
Received fax from [facility] regarding Saphnelo infusion that patient received on 07/25/22. Labs were not drawn.  Patient tolerated infusion without complications.  Changes/concerns since last visit: NONE  Next Saphnelo infusion scheduled for 08/22/22  Knox Saliva, PharmD, MPH, BCPS, CPP Clinical Pharmacist (Rheumatology and Pulmonology)

## 2022-08-03 NOTE — Progress Notes (Signed)
Office Visit Note  Patient: Christy Lowe             Date of Birth: 23-Jun-1981           MRN: [id number]             PCP: Horald Pollen, MD Referring: Horald Pollen, * Visit Date: 08/09/2022   Subjective:  Follow-up (Patient has new symptoms she'd like to discuss as well as a surgery she'd like to talk about.)   History of Present Illness: Christy Lowe is a 41 y.o. female here for follow up for lupus on HCQ 400 mg daily and saphnelo 300 mg IV monthly.  Her joint symptoms have remained pretty well controlled after completely tapering off the prednisone.  She is noticing a difference in symptoms after the Saphnelo infusion versus 4 weeks later proceeding her next dose.  Continues to have very little appetite but not seeing any weight reduction.  Skin itching especially on her upper legs is a persistent issue she is using topical emollients with partial benefit.  More recently she is also experiencing an increase in urinary frequency both daytime and increased nocturia.  No visible blood in urine or dysuria.  She is currently anticipating an upcoming plastic surgery needing adjustments for perioperative drug management.  Previous HPI 05/01/2022 Christy Lowe is a 41 y.o. female here for follow up for lupus on HCQ 400 mg daily started saphnelo infusion and prednisone tapering down to 2.5 mg daily now. So far she is noticing some improvement with overall severity of skin redness swelling and itching but definitely not resolved.  Face and chest continue having the most erythema.  She has noticed some bruising without any specific cause.  She is noticing some swelling or otherwise thickening in the joints of her right hand and around the right wrist.   Previous HPI 04/06/2022 Christy Lowe is a 41 y.o. female here for follow up for lupus on HCQ 400 mg daily started saphnelo infusion last month and prednisone tapering down to 2.5 mg daily now. She felt a large  improvement after her first treatment with decreased joint pains and decreased abdominal pain. She is tapering off the prednisone without severe problem. She has noticed oral ulcers on the top of her mouth for several days and is having a rash with small papules on both sides of her neck and lower face. Appetite is decreased but not losing any weight. She feels a lot of stress after dealing with work incident.   Previous HPI 03/06/2022 Christy Lowe is a 41 y.o. female here for follow up for lupus on HCQ 400 mg daily and prednisone tapering down to 10 mg daily after increase to 30 last month from flare up with mouth ulcers and diffuse rashes. She had several complications with high dose prednisone increased weight, mood irritability, and wound dehiscence af hysterectomy cuff site. We discussed plan to start saphenlo infusion as a replacement for benlysta treatment which did not improve symptoms or lab abnormalities much after about 6 months treatment.  She has some improvement in the mouth and lip ulcers she is not sure whether starting the valacyclovir about 4 days ago has had significant effect.  She completed the course of oral antibiotics abdominal pain is partially better. She is scheduled for first saphnelo infusion Thursday and has OBGYN followup later this week.       Previous HPI 12/13/21 Christy Lowe is a 41 y.o. female here  for follow up for systemic lupus with ongoing joint pains and skin rash on HCQ 400 mg daily, Benlysta 200 mg [place] weekly, and prednisone 10 mg daily. Since our last visit she went for hysterectomy this was uneventful and has recovered well. She was off the benlysta for about 1 month in total around the surgery. She has not felt much improvement so far still having a lot of fatigue, skin rash and flushing, joint pain in several areas with left wrist swelling. Headaches are about the same as before but now having recurrent episodes of vertigo. She is now taking hydroxyzine  twice on some days due to persistent itching, and feels excessively hot most of the time. She Is back to taking 10 mg of prednisone when trying to drop this to 5 mg felt extreme fatigue and back pain that felt like bruising and stiffness all over her body.   Previous HPI 11/17/20 Christy Lowe is a 41 y.o. female here for evaluation of positive ANA. She is noticing multiple symptoms including generalized alopecia, oral dryness, left sided neck swelling, joint pain and stiffness, position tremor, and episodic skin rashes. She has had multiple ongoing problems since many years ago. She was hospitalized for viral meningitis at age 59 and feels she has been susceptible to infections ever since this time or before, with numerous cases of bronchitis and has had some lung nodules on CT imaging during these repeat episodes. About 7 years ago she suffered from a RLE DVT provoked after right knee arthroscopy and developed right sided face and arm numbness. Workup indicated she had a TIA also noted to have PFO. She takes ASA since that time and has taken anticoagulation in perioperative periods. Workup with MRI and LP studies were apparently also indicative for MS. She was treated with multiple rounds of steroids for months at a time and felt a lot of mood disturbance from these. She was never started on other immunomodulatory or immunosuppressive treatments.   09/2020 ANA positive dsDNA 24   Review of Systems  Constitutional:  Negative for fatigue.  HENT:  Positive for mouth sores and mouth dryness.   Eyes:  Negative for dryness.  Respiratory:  Negative for shortness of breath.   Cardiovascular:  Negative for chest pain and palpitations.  Gastrointestinal:  Negative for blood in stool, constipation and diarrhea.  Endocrine: Positive for increased urination.  Genitourinary:  Positive for involuntary urination.  Musculoskeletal:  Positive for joint pain, joint pain and joint swelling. Negative for  gait problem, myalgias, muscle weakness, morning stiffness, muscle tenderness and myalgias.  Skin:  Positive for rash, hair loss and sensitivity to sunlight. Negative for color change.  Allergic/Immunologic: Positive for susceptible to infections.  Neurological:  Positive for dizziness and headaches.  Hematological:  Positive for swollen glands.  Psychiatric/Behavioral:  Negative for depressed mood and sleep disturbance. The patient is nervous/anxious.     PMFS History:  Patient Active Problem List   Diagnosis Date Noted   Hyperglycemia 05/23/2022   Obesity without serious comorbidity 05/23/2022   Rash and other nonspecific skin eruption 04/06/2022   Leukocytosis 03/06/2022   Long-term corticosteroid use 02/20/2022   Situational anxiety 02/20/2022   S/P laparoscopic hysterectomy 10/31/2021   Urinary urgency 09/09/2021   Itching 09/09/2021   High risk medication use 07/25/2021   Finger joint swelling, left 04/01/2021   Systemic lupus erythematosus ([place]) 04/01/2021   Fibromyalgia 04/01/2021   Alopecia 11/17/2020   Other fatigue 10/12/2020   Migraine without aura  History of DVT (deep vein thrombosis)    History of endometriosis    Right-sided muscle weakness 05/15/2019   Abnormal finding on MRI of brain 05/15/2019   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   Tremor 12/24/2017    Past Medical History:  Diagnosis Date   aub    Bruises easily 09/60/4540   Complication of anesthesia 10/15/2020   hypotension   Fibromyalgia    GERD (gastroesophageal reflux disease)    Heart murmur  PFO    off 81 mg aspirin since feb 2022 per pcp  no cardiologist   Hemangioma    History of DVT (deep vein thrombosis)    right le 7 yrs ago, right leg slight swollen   History of endometriosis    History of hiatal hernia    Hypotension    with 10-15-2020 endoscopy   Migraine without aura    Systemic lupus erythematosus ([place])    TIA    ages 29 and 46   Viral meningitis    age 33    Family  History  Problem Relation Age of Onset   Alcohol abuse Mother    Cancer Father        Lung and Colon   Heart attack Father    COPD Father    Breast cancer Paternal Aunt        16's   Ovarian cancer Paternal Aunt        40's   Diabetes Maternal Grandmother    Cervical cancer Maternal Grandmother    Fibromyalgia Maternal Grandmother    Breast cancer Maternal Grandmother        ? age of dx   Prostate cancer Maternal Grandfather    Heart attack Maternal Grandfather    ALS Paternal Grandmother    Prostate cancer Paternal Grandfather    Heart attack Paternal Grandfather    Esophageal cancer Paternal Grandfather    Breast cancer Other    Colon cancer Neg Hx    Stomach cancer Neg Hx    Rectal cancer Neg Hx    Past Surgical History:  Procedure Laterality Date   ABDOMINAL HYSTERECTOMY     ANTERIOR CRUCIATE LIGAMENT REPAIR Left 10/2018   CYSTOSCOPY  10/31/2021   Procedure: CYSTOSCOPY;  Surgeon: Salvadore Dom, MD;  Location: [facility];  Service: Gynecology;;   PELVIC LAPAROSCOPY     for endometriosis yrs ago per pt on 10-24-2021   right knee lateral release and meniscal repair  2011   TONSILLECTOMY     age 69, adenoids removed also   TOTAL LAPAROSCOPIC HYSTERECTOMY WITH SALPINGECTOMY Bilateral 10/31/2021   Procedure: TOTAL LAPAROSCOPIC HYSTERECTOMY WITH SALPINGECTOMY;  Surgeon: Salvadore Dom, MD;  Location: [facility];  Service: Gynecology;  Laterality: Bilateral;   UMBILICAL HERNIA REPAIR     yrs ago   UPPER GI ENDOSCOPY     with esophagus stretching 10-15-2020, 03-11-2020, 05-09-2019,  2013 with being polyp removed   Social History   Social History Narrative   Not on file   Immunization History  Administered Date(s) Administered   PFIZER(Purple Top)SARS-COV-2 Vaccination 06/27/2020, 07/28/2020   PPD Test 01/23/2019     Objective: Vital Signs: BP 114/76 (BP Location: Right Arm, Patient Position: Sitting, Cuff Size: Normal)    Pulse 83   Resp 15   Ht '5\' 7"'$  (1.702 m)   Wt 193 lb 6.4 oz (87.7 kg)   LMP 10/10/2021   BMI 30.29 kg/m    Physical Exam Cardiovascular:  Rate and Rhythm: Normal rate and regular rhythm.  Pulmonary:     Effort: Pulmonary effort is normal.     Breath sounds: Normal breath sounds.  Musculoskeletal:     Right lower leg: No edema.     Left lower leg: No edema.  Skin:    General: Skin is warm and dry.     Findings: Rash present.     Comments: Flat blanching erythema on face and upper chest Few small papules along the sides and bottom of the jaw some unroofed  Neurological:     Mental Status: She is alert.  Psychiatric:        Mood and Affect: Mood normal.      Musculoskeletal Exam:  Neck full ROM no tenderness Shoulders full ROM no tenderness or swelling Elbows full ROM no tenderness or swelling Wrists full ROM no tenderness or swelling Fingers full ROM no tenderness or swelling Knees full ROM no tenderness or swelling  Investigation: No additional findings.  Imaging: No results found.  Recent Labs: Lab Results  Component Value Date   WBC 5.5 08/09/2022   HGB 14.1 08/09/2022   PLT 272 08/09/2022   NA 138 08/09/2022   K 4.5 08/09/2022   CL 103 08/09/2022   CO2 27 08/09/2022   GLUCOSE 82 08/09/2022   BUN 12 08/09/2022   CREATININE 1.07 (H) 08/09/2022   BILITOT 0.3 08/09/2022   ALKPHOS 47 10/26/2021   AST 33 (H) 08/09/2022   ALT 35 (H) 08/09/2022   PROT 6.7 08/09/2022   ALBUMIN 4.9 10/26/2021   CALCIUM 9.8 08/09/2022   GFRAA >60 03/10/2020   QFTBGOLDPLUS NEGATIVE 07/25/2021    Speciality Comments: PLQ Eye Exam [facility] 07/03/2022 WNL Follow-up: 12 months   Procedures:  No procedures performed Allergies: Cephalexin, Penicillins, Amitiza [lubiprostone], and Sulfa antibiotics   Assessment / Plan:     Visit Diagnoses: Systemic lupus erythematosus, unspecified SLE type, unspecified organ involvement status ([place]) - Plan: Anti-DNA antibody,  double-stranded  Symptoms all appear stable versus improved in different domains since last visit and she is on 0 prednisone.  Plan to recheck double-stranded DNA antibody titer.  Continue hydroxychloroquine 400 mg daily and Saphnelo 300 mg IV monthly.  We will probably need to reschedule upcoming infusion based on perioperative management.  She is also anticipating out-of-state travel have to figure out coordinating treatment with her.  High risk medication use - Saphnelo 300 mg IV monthly, hydroxychloroquine 400 mg daily - Plan: CBC with Differential/Platelet, COMPLETE METABOLIC PANEL WITH GFR  Checking CBC and CMP for medication monitoring on Saphnelo on hydroxychloroquine.  Hydroxychloroquine eye exam last month within normal limits.  Rash and other nonspecific skin eruption  Urinary urgency - Plan: Urinalysis, Routine w reflex microscopic, nitrofurantoin, macrocrystal-monohydrate, (MACROBID) 100 MG capsule  Increased urinary frequency with severe urgency checking urinalysis suspect possible UTI.  Itching  Not completely sure of the this is a common symptom with lupus.  She does not tolerate hydroxyzine or generally antihistamines well.  No visible skin changes in this affected area currently.  Orders: Orders Placed This Encounter  Procedures   MICROSCOPIC MESSAGE   Anti-DNA antibody, double-stranded   CBC with Differential/Platelet   COMPLETE METABOLIC PANEL WITH GFR   Urinalysis, Routine w reflex microscopic   Meds ordered this encounter  Medications   nitrofurantoin, macrocrystal-monohydrate, (MACROBID) 100 MG capsule    Sig: Take 1 capsule (100 mg total) by mouth 2 (two) times daily for 5 days.    Dispense:  10  capsule    Refill:  0     Follow-Up Instructions: Return in about 3 months (around 11/08/2022) for SLE on saphnelo/HCQ f/u 57mo.   CCollier Salina MD  Note - This record has been created using [employer]  Chart creation errors have been sought, but  may not always  have been located. Such creation errors do not reflect on  the standard of medical care.

## 2022-08-09 ENCOUNTER — Encounter: Payer: Self-pay | Admitting: Internal Medicine

## 2022-08-09 ENCOUNTER — Ambulatory Visit: Payer: BC Managed Care – PPO | Attending: Internal Medicine | Admitting: Internal Medicine

## 2022-08-09 VITALS — BP 114/76 | HR 83 | Resp 15 | Ht 67.0 in | Wt 193.4 lb

## 2022-08-09 DIAGNOSIS — Z79899 Other long term (current) drug therapy: Secondary | ICD-10-CM

## 2022-08-09 DIAGNOSIS — R3915 Urgency of urination: Secondary | ICD-10-CM | POA: Diagnosis not present

## 2022-08-09 DIAGNOSIS — M329 Systemic lupus erythematosus, unspecified: Secondary | ICD-10-CM

## 2022-08-09 DIAGNOSIS — R21 Rash and other nonspecific skin eruption: Secondary | ICD-10-CM

## 2022-08-09 DIAGNOSIS — D72829 Elevated white blood cell count, unspecified: Secondary | ICD-10-CM

## 2022-08-09 DIAGNOSIS — L299 Pruritus, unspecified: Secondary | ICD-10-CM

## 2022-08-10 ENCOUNTER — Telehealth: Payer: Self-pay | Admitting: Pharmacist

## 2022-08-10 LAB — CBC WITH DIFFERENTIAL/PLATELET
Absolute Monocytes: 523 cells/uL (ref 200–950)
Basophils Absolute: 28 cells/uL (ref 0–200)
Basophils Relative: 0.5 %
Eosinophils Absolute: 99 cells/uL (ref 15–500)
Eosinophils Relative: 1.8 %
HCT: 42 % (ref 35.0–45.0)
Hemoglobin: 14.1 g/dL (ref 11.7–15.5)
Lymphs Abs: 1826 cells/uL (ref 850–3900)
MCH: 30.1 pg (ref 27.0–33.0)
MCHC: 33.6 g/dL (ref 32.0–36.0)
MCV: 89.6 fL (ref 80.0–100.0)
MPV: 9.6 fL (ref 7.5–12.5)
Monocytes Relative: 9.5 %
Neutro Abs: 3025 cells/uL (ref 1500–7800)
Neutrophils Relative %: 55 %
Platelets: 272 10*3/uL (ref 140–400)
RBC: 4.69 10*6/uL (ref 3.80–5.10)
RDW: 13 % (ref 11.0–15.0)
Total Lymphocyte: 33.2 %
WBC: 5.5 10*3/uL (ref 3.8–10.8)

## 2022-08-10 LAB — COMPLETE METABOLIC PANEL WITH GFR
AG Ratio: 1.8 (calc) (ref 1.0–2.5)
ALT: 35 U/L — ABNORMAL HIGH (ref 6–29)
AST: 33 U/L — ABNORMAL HIGH (ref 10–30)
Albumin: 4.3 g/dL (ref 3.6–5.1)
Alkaline phosphatase (APISO): 51 U/L (ref 31–125)
BUN/Creatinine Ratio: 11 (calc) (ref 6–22)
BUN: 12 mg/dL (ref 7–25)
CO2: 27 mmol/L (ref 20–32)
Calcium: 9.8 mg/dL (ref 8.6–10.2)
Chloride: 103 mmol/L (ref 98–110)
Creat: 1.07 mg/dL — ABNORMAL HIGH (ref 0.50–0.97)
Globulin: 2.4 g/dL (calc) (ref 1.9–3.7)
Glucose, Bld: 82 mg/dL (ref 65–99)
Potassium: 4.5 mmol/L (ref 3.5–5.3)
Sodium: 138 mmol/L (ref 135–146)
Total Bilirubin: 0.3 mg/dL (ref 0.2–1.2)
Total Protein: 6.7 g/dL (ref 6.1–8.1)
eGFR: 68 mL/min/{1.73_m2} (ref >=60)

## 2022-08-10 LAB — URINALYSIS, ROUTINE W REFLEX MICROSCOPIC
Bilirubin Urine: NEGATIVE
Glucose, UA: NEGATIVE
Hgb urine dipstick: NEGATIVE
Hyaline Cast: NONE SEEN /LPF
Ketones, ur: NEGATIVE
Nitrite: POSITIVE — AB
Protein, ur: NEGATIVE
Specific Gravity, Urine: 1.013 (ref 1.001–1.035)
Squamous Epithelial / HPF: NONE SEEN /HPF (ref <=5)
pH: 7.5 (ref 5.0–8.0)

## 2022-08-10 LAB — ANTI-DNA ANTIBODY, DOUBLE-STRANDED: ds DNA Ab: 14 IU/mL — ABNORMAL HIGH

## 2022-08-10 MED ORDER — NITROFURANTOIN MONOHYD MACRO 100 MG PO CAPS: 100.0000 mg | ORAL_CAPSULE | Freq: Two times a day (BID) | ORAL | 0 refills | Status: AC

## 2022-08-10 NOTE — Progress Notes (Signed)
I spoke with Christy Lowe her urinalysis looks consistent with an infection due to positive leukocytes, nitrites, and bacteria. She is allergic to bactrim and keflex. Rx for macrobid 100 mg BID x5 days. She has no risk factors for resistant organism. If symptoms do not resolve can return to recheck with Cx and sensitivity.

## 2022-08-10 NOTE — Telephone Encounter (Signed)
Patient interested in receiving infusions at [email or web address] in [place]. Pharmacy team will need to contact infusion center to determine if they can take orders from out-of-state providers  Knox Saliva, PharmD, MPH, BCPS, CPP Clinical Pharmacist (Rheumatology and Pulmonology)

## 2022-08-11 ENCOUNTER — Telehealth: Payer: Self-pay | Admitting: Internal Medicine

## 2022-08-11 NOTE — Telephone Encounter (Signed)
I spoke with Dr. Nathanial Rancher to discuss perioperative management for Christy Lowe.  We are rescheduling Saphnelo so she will be slightly over 1 month since her last infusion prior to the surgery.  I recommend we can aim to reschedule this for 2 weeks postoperatively unless there is any delay in her recovery and follow-up process requiring it to be moved.  I recommend continuing the hydroxychloroquine through the perioperative period and surgery.  I do not think she needs any additional VTE prophylaxis outside of routine for the procedure.  She is interested in prescribing 5 mg prednisone Dosepak anticipating possible inflammation after surgery which I agree is reasonable and does not need stress dose.

## 2022-08-11 NOTE — Telephone Encounter (Signed)
Confirmed with [facility] that they do take orders from out of state providers. Will need to prepare [facility] referral orders for patient to receive infusions in January through March 2024.  Knox Saliva, PharmD, MPH, BCPS, CPP Clinical Pharmacist (Rheumatology and Pulmonology)

## 2022-08-11 NOTE — Telephone Encounter (Signed)
Dr. Nathanial Rancher called the office stating she will be performing surgery on the patient soon and would like to speak with Dr. Benjamine Mola about the patients medications.   To reach her you can contact her directly on her cell at [phone] or call her office at [phone]. She would like a call back today.

## 2022-08-15 NOTE — Telephone Encounter (Signed)
Benlysta infusion orders prepped for [facility] to be submitted in December 2023  Patient will be in [place] from January to March 2024  Knox Saliva, PharmD, MPH, BCPS, CPP Clinical Pharmacist (Rheumatology and Pulmonology)

## 2022-09-11 ENCOUNTER — Telehealth: Payer: Self-pay | Admitting: Internal Medicine

## 2022-09-11 ENCOUNTER — Encounter: Payer: Self-pay | Admitting: Internal Medicine

## 2022-09-11 NOTE — Telephone Encounter (Signed)
Patient called the office stating she has surgery on October 4th and still has drains in. Patient states she is supposed to be having her infusion tomorrow at 9:30am. Patient states her surgeon said everything looks good they just need to know if its ok to have the infusion with the drains still in. Patient states they are still draining a lot of blood and fluid. Patient would like a call tomorrow morning as soon as Dr. Benjamine Mola is in office.

## 2022-09-12 NOTE — Telephone Encounter (Signed)
I sent patient a MyChart message as well should be okay to have the scheduled infusion.

## 2022-09-12 NOTE — Telephone Encounter (Signed)
Left message for patient to call us back concerning her infusion today. (Dr Rice's MyChart message: It should be fine to go ahead with infusion unless your surgeon has any specific concerns about infection.)

## 2022-09-12 NOTE — Telephone Encounter (Signed)
Patient returned call and was advised that Dr. Benjamine Mola says it should be fine to go ahead with her infusion unless her surgeon has any specific concerns about infection.  Patient expressed verbal understanding of message and stated that she rescheduled infusion for tomorrow just to be certain that it would be ok to proceed with infusion.

## 2022-09-13 DIAGNOSIS — M329 Systemic lupus erythematosus, unspecified: Secondary | ICD-10-CM | POA: Diagnosis not present

## 2022-09-14 ENCOUNTER — Telehealth: Payer: Self-pay | Admitting: Pharmacist

## 2022-09-14 NOTE — Telephone Encounter (Signed)
Received fax from [facility] regarding [place] infusion that patient received on 09/13/22. Labs were not drawn.  Patient tolerated infusion without complications.  Changes/concerns since last visit:  She had a mass removal in abdomen on 08/30/22. Has drain in place x 2  Next SAPHNELO infusion scheduled for 10/10/22  Knox Saliva, PharmD, MPH, BCPS, CPP Clinical Pharmacist (Rheumatology and Pulmonology)

## 2022-10-09 DIAGNOSIS — M329 Systemic lupus erythematosus, unspecified: Secondary | ICD-10-CM | POA: Diagnosis not present

## 2022-10-10 ENCOUNTER — Telehealth: Payer: Self-pay | Admitting: Pharmacist

## 2022-10-10 NOTE — Telephone Encounter (Signed)
Received fax from [facility] regarding [place] infusion that patient received on 10/09/22. Labs were not drawn.  Patient tolerated infusion without complications.  Changes/concerns since last visit:  NONE  Next SAPHNELO infusion scheduled for 11/07/22  Knox Saliva, PharmD, MPH, BCPS, CPP Clinical Pharmacist (Rheumatology and Pulmonology)

## 2022-11-01 ENCOUNTER — Encounter: Payer: Self-pay | Admitting: *Deleted

## 2022-11-01 ENCOUNTER — Ambulatory Visit (INDEPENDENT_AMBULATORY_CARE_PROVIDER_SITE_OTHER): Payer: BC Managed Care – PPO | Admitting: Obstetrics and Gynecology

## 2022-11-01 ENCOUNTER — Ambulatory Visit (HOSPITAL_COMMUNITY)
Admission: RE | Admit: 2022-11-01 | Discharge: 2022-11-01 | Disposition: A | Discharge status: Home / Self Care | Payer: BC Managed Care – PPO | Source: Ambulatory Visit | Attending: Emergency Medicine | Admitting: Emergency Medicine

## 2022-11-01 ENCOUNTER — Encounter: Payer: Self-pay | Admitting: Obstetrics and Gynecology

## 2022-11-01 ENCOUNTER — Encounter: Payer: Self-pay | Admitting: Emergency Medicine

## 2022-11-01 ENCOUNTER — Inpatient Hospital Stay: Payer: Self-pay

## 2022-11-01 ENCOUNTER — Ambulatory Visit: Payer: BC Managed Care – PPO | Admitting: Emergency Medicine

## 2022-11-01 VITALS — BP 110/78 | HR 88 | Temp 98.8°F | Ht 67.0 in | Wt 186.0 lb

## 2022-11-01 VITALS — BP 110/62 | HR 72 | Wt 186.0 lb

## 2022-11-01 DIAGNOSIS — I872 Venous insufficiency (chronic) (peripheral): Secondary | ICD-10-CM | POA: Insufficient documentation

## 2022-11-01 DIAGNOSIS — R2241 Localized swelling, mass and lump, right lower limb: Secondary | ICD-10-CM | POA: Insufficient documentation

## 2022-11-01 DIAGNOSIS — N939 Abnormal uterine and vaginal bleeding, unspecified: Secondary | ICD-10-CM

## 2022-11-01 DIAGNOSIS — Z90710 Acquired absence of both cervix and uterus: Secondary | ICD-10-CM

## 2022-11-01 NOTE — Patient Instructions (Signed)
Chronic Venous Insufficiency Chronic venous insufficiency is a condition where the leg veins cannot effectively pump blood from the legs to the heart. This happens when the vein walls are either stretched, weakened, or damaged, or when the valves inside the vein are damaged. With the right treatment, you should be able to continue with an active life. This condition is also called venous stasis. What are the causes? Common causes of this condition include: High blood pressure inside the veins (venous hypertension). Sitting or standing too long, causing increased blood pressure in the leg veins. A blood clot that blocks blood flow in a vein (deep vein thrombosis, DVT). Inflammation of a vein (phlebitis) that causes a blood clot to form. Tumors in the pelvis that cause blood to back up. What increases the risk? The following factors may make you more likely to develop this condition: Having a family history of this condition. Obesity. Pregnancy. Living without enough regular physical activity or exercise (sedentary lifestyle). Smoking. Having a job that requires long periods of standing or sitting in one place. Being a certain age. Women in their 21s and 52s and men in their 97s are more likely to develop this condition. What are the signs or symptoms? Symptoms of this condition include: Veins that are enlarged, bulging, or twisted (varicose veins). Skin breakdown or ulcers. Reddened skin or dark discoloration of skin on the leg between the knee and ankle. Brown, smooth, tight, and painful skin just above the ankle, usually on the inside of the leg (lipodermatosclerosis). Swelling of the legs. How is this diagnosed? This condition may be diagnosed based on: Your medical history. A physical exam. Tests, such as: A procedure that creates an image of a blood vessel and nearby organs and provides information about blood flow through the blood vessel (duplex ultrasound). A procedure that  tests blood flow (plethysmography). A procedure that looks at the veins using X-ray and dye (venogram). How is this treated? The goals of treatment are to help you return to an active life and to minimize pain or disability. Treatment depends on the severity of your condition, and it may include: Wearing compression stockings. These can help relieve symptoms and help prevent your condition from getting worse. However, they do not cure the condition. Sclerotherapy. This procedure involves an injection of a solution that shrinks damaged veins. Surgery. This may involve: Removing a diseased vein (vein stripping). Cutting off blood flow through the vein (laser ablation surgery). Repairing or reconstructing a valve within the affected vein. Follow these instructions at home:     Wear compression stockings as told by your health care provider. These stockings help to prevent blood clots and reduce swelling in your legs. Take over-the-counter and prescription medicines only as told by your health care provider. Stay active by exercising, walking, or doing different activities. Ask your health care provider what activities are safe for you and how much exercise you need. Drink enough fluid to keep your urine pale yellow. Do not use any products that contain nicotine or tobacco, such as cigarettes, e-cigarettes, and chewing tobacco. If you need help quitting, ask your health care provider. Keep all follow-up visits as told by your health care provider. This is important. Contact a health care provider if you: Have redness, swelling, or more pain in the affected area. See a red streak or line that goes up or down from the affected area. Have skin breakdown or skin loss in the affected area, even if the breakdown is small. Get  an injury in the affected area. Get help right away if: You get an injury and an open wound in the affected area. You have: Severe pain that does not get better with  medicine. Sudden numbness or weakness in the foot or ankle below the affected area. Trouble moving your foot or ankle. A fever. Worse or persistent symptoms. Chest pain. Shortness of breath. Summary Chronic venous insufficiency is a condition where the leg veins cannot effectively pump blood from the legs to the heart. Chronic venous insufficiency occurs when the vein walls become stretched, weakened, or damaged, or when valves within the vein are damaged. Treatment depends on how severe your condition is. It often involves wearing compression stockings and may involve having a procedure. Make sure you stay active by exercising, walking, or doing different activities. Ask your health care provider what activities are safe for you and how much exercise you need. This information is not intended to replace advice given to you by your health care provider. Make sure you discuss any questions you have with your health care provider. Document Revised: 01/25/2021 Document Reviewed: 01/25/2021 Elsevier Patient Education  [place].

## 2022-11-01 NOTE — Assessment & Plan Note (Signed)
Differential diagnosis discussed. No signs of cellulitis. Doubt DVT but needs venous ultrasound to rule it out. Continue elevation and compression socks.

## 2022-11-01 NOTE — Progress Notes (Signed)
GYNECOLOGY  VISIT   HPI: 41 y.o.   Single White or Caucasian Not Hispanic or Latino  female   [id number] with Patient's last menstrual period was 10/10/2021.   here for vaginal bleeding. She had a TLH in 12/23. She had a small cuff dehiscence in 3/23, healed on it's own. She had a large amount of dark vaginal bleeding on Saturday, no intercourse or heavy lifting prior to the bleeding. She wore a tampon and is sure the blood was coming from her vagina.   She is not currently sexually active, has had some spotting with sex in the last year, no pain. No STD concerns.  She has a h/o lupus and is on an immunosuppressant.    GYNECOLOGIC HISTORY: Patient's last menstrual period was 10/10/2021. Contraception:hysterectomy  Menopausal hormone therapy: none         OB History     Gravida  0   Para  0   Term  0   Preterm  0   AB  0   Living  0      SAB  0   IAB  0   Ectopic  0   Multiple  0   Live Births  0              Patient Active Problem List   Diagnosis Date Noted   Hyperglycemia 05/23/2022   Obesity without serious comorbidity 05/23/2022   Rash and other nonspecific skin eruption 04/06/2022   Leukocytosis 03/06/2022   Long-term corticosteroid use 02/20/2022   Redness 11/14/2021   Blurry vision 11/07/2021   Hot flash not due to menopause 11/04/2021   S/P laparoscopic hysterectomy 10/31/2021   Urinary urgency 09/09/2021   Itching 09/09/2021   High risk medication use 07/25/2021   Finger joint swelling, left 04/01/2021   Systemic lupus erythematosus ([place]) 04/01/2021   Fibromyalgia 04/01/2021   Alopecia 11/17/2020   Other fatigue 10/12/2020   Migraine without aura    History of DVT (deep vein thrombosis)    History of endometriosis    Right-sided muscle weakness 05/15/2019   Abnormal finding on MRI of brain 05/15/2019   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   Tremor 12/24/2017    Past Medical History:  Diagnosis Date   aub    Bruises easily 28/31/5176    Complication of anesthesia 10/15/2020   hypotension   Fibromyalgia    GERD (gastroesophageal reflux disease)    Heart murmur  PFO    off 81 mg aspirin since feb 2022 per pcp  no cardiologist   Hemangioma    History of DVT (deep vein thrombosis)    right le 7 yrs ago, right leg slight swollen   History of endometriosis    History of hiatal hernia    Hypotension    with 10-15-2020 endoscopy   Migraine without aura    Systemic lupus erythematosus ([place])    TIA    ages 46 and 52   Viral meningitis    age 89    Past Surgical History:  Procedure Laterality Date   ABDOMINAL HYSTERECTOMY     ANTERIOR CRUCIATE LIGAMENT REPAIR Left 10/2018   CYSTOSCOPY  10/31/2021   Procedure: CYSTOSCOPY;  Surgeon: Salvadore Dom, MD;  Location: [place];  Service: Gynecology;;   PELVIC LAPAROSCOPY     for endometriosis yrs ago per pt on 10-24-2021   right knee lateral release and meniscal repair  2011   TONSILLECTOMY     age 39, adenoids removed  also   TOTAL LAPAROSCOPIC HYSTERECTOMY WITH SALPINGECTOMY Bilateral 10/31/2021   Procedure: TOTAL LAPAROSCOPIC HYSTERECTOMY WITH SALPINGECTOMY;  Surgeon: Salvadore Dom, MD;  Location: [facility];  Service: Gynecology;  Laterality: Bilateral;   UMBILICAL HERNIA REPAIR     yrs ago   UPPER GI ENDOSCOPY     with esophagus stretching 10-15-2020, 03-11-2020, 05-09-2019,  2013 with being polyp removed    Current Outpatient Medications  Medication Sig Dispense Refill   Anifrolumab-fnia 300 MG/2ML SOLN Inject 300 mg into the vein every 28 (twenty-eight) days.     cyclobenzaprine (FLEXERIL) 10 MG tablet TAKE 1 TABLET BY MOUTH EVERY NIGHT AT BEDTIME AS NEEDED FOR MUSCLE SPASMS 30 tablet 2   hydroxychloroquine (PLAQUENIL) 200 MG tablet TAKE 2 TABLETS BY MOUTH EVERY DAY 180 tablet 1   hydrOXYzine (ATARAX) 25 MG tablet TAKE AT NIGHT OR UP TO 2 TIMES DAILY AS NEEDED FOR ITCHING 180 tablet 1   triamcinolone cream (KENALOG) 0.1  % Apply topically 2 (two) times daily.     No current facility-administered medications for this visit.     ALLERGIES: Cephalexin, Penicillins, Amitiza [lubiprostone], and Sulfa antibiotics  Family History  Problem Relation Age of Onset   Alcohol abuse Mother    Cancer Father        Lung and Colon   Heart attack Father    COPD Father    Breast cancer Paternal Aunt        53's   Ovarian cancer Paternal Aunt        40's   Diabetes Maternal Grandmother    Cervical cancer Maternal Grandmother    Fibromyalgia Maternal Grandmother    Breast cancer Maternal Grandmother        ? age of dx   Prostate cancer Maternal Grandfather    Heart attack Maternal Grandfather    ALS Paternal Grandmother    Prostate cancer Paternal Grandfather    Heart attack Paternal Grandfather    Esophageal cancer Paternal Grandfather    Breast cancer Other    Colon cancer Neg Hx    Stomach cancer Neg Hx    Rectal cancer Neg Hx     Social History   Socioeconomic History   Marital status: Single    Spouse name: Not on file   Number of children: Not on file   Years of education: Not on file   Highest education level: Not on file  Occupational History   Not on file  Tobacco Use   Smoking status: Never   Smokeless tobacco: Never  Vaping Use   Vaping Use: Never used  Substance and Sexual Activity   Alcohol use: Not Currently    Alcohol/week: 2.0 standard drinks of alcohol    Types: 2 Glasses of wine per week   Drug use: Never   Sexual activity: Yes    Birth control/protection: Surgical    Comment: partner with vasectomy  Other Topics Concern   Not on file  Social History Narrative   Not on file   Social Determinants of Health   Financial Resource Strain: Not on file  Food Insecurity: Not on file  Transportation Needs: Not on file  Physical Activity: Not on file  Stress: Not on file  Social Connections: Not on file  Intimate Partner Violence: Not on file    Review of Systems  All  other systems reviewed and are negative.   PHYSICAL EXAMINATION:    BP 110/62   Pulse 72   Wt 186 lb (  84.4 kg)   LMP 10/10/2021   SpO2 98%   BMI 29.13 kg/m     General appearance: alert, cooperative and appears stated age   Pelvic: External genitalia:  no lesions              Urethra:  normal appearing urethra with no masses, tenderness or lesions              Bartholins and Skenes: normal                 Vagina: normal appearing vagina with normal color and discharge, no lesions. The vaginal cuff was carefully examined with a graves speculum, no granulation tissue, it is intact and not tender. No source of bleeding was found              Cervix: absent              Bimanual Exam:  Uterus:  uterus absent              Adnexa: no mass, fullness, tenderness                Chaperone was present for exam.  1. Vaginal bleeding Episode of vaginal bleeding over the weekend. No source of bleeding noted on exam. Her cuff is intact without granulation tissue -Reach out with further bleeding -F/U for annual exam   2. S/P laparoscopic hysterectomy

## 2022-11-01 NOTE — Progress Notes (Signed)
Christy Lowe 41 y.o.   Chief Complaint  Patient presents with   Acute Visit    Right leg swelling , last week and 1/2 leg constant swelling,      HISTORY OF PRESENT ILLNESS: This is a 41 y.o. female complaining of swelling to right lower leg on and off for the past several weeks. Has history of systemic lupus. Denies injury or pain. Elevation and compression socks help. No other complaints or medical concerns today.  HPI   Prior to Admission medications   Medication Sig Start Date End Date Taking? Authorizing Provider  Anifrolumab-fnia 300 MG/2ML SOLN Inject 300 mg into the vein every 28 (twenty-eight) days. 03/09/22  Yes Rice, Resa Miner, MD  cyclobenzaprine (FLEXERIL) 10 MG tablet TAKE 1 TABLET BY MOUTH EVERY NIGHT AT BEDTIME AS NEEDED FOR MUSCLE SPASMS 06/30/22  Yes Rice, Resa Miner, MD  hydroxychloroquine (PLAQUENIL) 200 MG tablet TAKE 2 TABLETS BY MOUTH EVERY DAY 07/14/22  Yes Rice, Resa Miner, MD  hydrOXYzine (ATARAX) 25 MG tablet TAKE AT NIGHT OR UP TO 2 TIMES DAILY AS NEEDED FOR ITCHING 06/20/22  Yes Rice, Resa Miner, MD  triamcinolone cream (KENALOG) 0.1 % Apply topically 2 (two) times daily. 07/24/22  Yes [provider]    Allergies  Allergen Reactions   Cephalexin Hives and Shortness Of Breath   Penicillins Hives and Anaphylaxis    SOB As child    Amitiza [Lubiprostone]     N/v   Sulfa Antibiotics Other (See Comments)    As child Doesn't remember     Patient Active Problem List   Diagnosis Date Noted   Hyperglycemia 05/23/2022   Obesity without serious comorbidity 05/23/2022   Rash and other nonspecific skin eruption 04/06/2022   Leukocytosis 03/06/2022   Long-term corticosteroid use 02/20/2022   Redness 11/14/2021   Blurry vision 11/07/2021   Hot flash not due to menopause 11/04/2021   S/P laparoscopic hysterectomy 10/31/2021   Urinary urgency 09/09/2021   Itching 09/09/2021   High risk medication use 07/25/2021   Finger joint  swelling, left 04/01/2021   Systemic lupus erythematosus ([place]) 04/01/2021   Fibromyalgia 04/01/2021   Alopecia 11/17/2020   Other fatigue 10/12/2020   Migraine without aura    History of DVT (deep vein thrombosis)    History of endometriosis    Right-sided muscle weakness 05/15/2019   Abnormal finding on MRI of brain 05/15/2019   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   Tremor 12/24/2017    Past Medical History:  Diagnosis Date   aub    Bruises easily 62/37/6283   Complication of anesthesia 10/15/2020   hypotension   Fibromyalgia    GERD (gastroesophageal reflux disease)    Heart murmur  PFO    off 81 mg aspirin since feb 2022 per pcp  no cardiologist   Hemangioma    History of DVT (deep vein thrombosis)    right le 7 yrs ago, right leg slight swollen   History of endometriosis    History of hiatal hernia    Hypotension    with 10-15-2020 endoscopy   Migraine without aura    Systemic lupus erythematosus ([place])    TIA    ages 63 and 38   Viral meningitis    age 69    Past Surgical History:  Procedure Laterality Date   ABDOMINAL HYSTERECTOMY     ANTERIOR CRUCIATE LIGAMENT REPAIR Left 10/2018   CYSTOSCOPY  10/31/2021   Procedure: CYSTOSCOPY;  Surgeon: Salvadore Dom, MD;  Location: [place];  Service: Gynecology;;   PELVIC LAPAROSCOPY     for endometriosis yrs ago per pt on 10-24-2021   right knee lateral release and meniscal repair  2011   TONSILLECTOMY     age 80, adenoids removed also   TOTAL LAPAROSCOPIC HYSTERECTOMY WITH SALPINGECTOMY Bilateral 10/31/2021   Procedure: TOTAL LAPAROSCOPIC HYSTERECTOMY WITH SALPINGECTOMY;  Surgeon: Salvadore Dom, MD;  Location: [facility];  Service: Gynecology;  Laterality: Bilateral;   UMBILICAL HERNIA REPAIR     yrs ago   UPPER GI ENDOSCOPY     with esophagus stretching 10-15-2020, 03-11-2020, 05-09-2019,  2013 with being polyp removed    Social History   Socioeconomic History    Marital status: Single    Spouse name: Not on file   Number of children: Not on file   Years of education: Not on file   Highest education level: Not on file  Occupational History   Not on file  Tobacco Use   Smoking status: Never   Smokeless tobacco: Never  Vaping Use   Vaping Use: Never used  Substance and Sexual Activity   Alcohol use: Not Currently    Alcohol/week: 2.0 standard drinks of alcohol    Types: 2 Glasses of wine per week   Drug use: Never   Sexual activity: Yes    Birth control/protection: Surgical    Comment: partner with vasectomy  Other Topics Concern   Not on file  Social History Narrative   Not on file   Social Determinants of Health   Financial Resource Strain: Not on file  Food Insecurity: Not on file  Transportation Needs: Not on file  Physical Activity: Not on file  Stress: Not on file  Social Connections: Not on file  Intimate Partner Violence: Not on file    Family History  Problem Relation Age of Onset   Alcohol abuse Mother    Cancer Father        Lung and Colon   Heart attack Father    COPD Father    Breast cancer Paternal Aunt        40's   Ovarian cancer Paternal Aunt        40's   Diabetes Maternal Grandmother    Cervical cancer Maternal Grandmother    Fibromyalgia Maternal Grandmother    Breast cancer Maternal Grandmother        ? age of dx   Prostate cancer Maternal Grandfather    Heart attack Maternal Grandfather    ALS Paternal Grandmother    Prostate cancer Paternal Grandfather    Heart attack Paternal Grandfather    Esophageal cancer Paternal Grandfather    Breast cancer Other    Colon cancer Neg Hx    Stomach cancer Neg Hx    Rectal cancer Neg Hx      Review of Systems  Constitutional: Negative.  Negative for chills and fever.  HENT: Negative.  Negative for congestion and sore throat.   Respiratory: Negative.  Negative for cough and shortness of breath.   Cardiovascular:  Positive for leg swelling. Negative  for chest pain and palpitations.  Gastrointestinal:  Negative for abdominal pain, diarrhea, nausea and vomiting.  Genitourinary: Negative.  Negative for dysuria and hematuria.  Skin: Negative.  Negative for rash.  Neurological: Negative.  Negative for dizziness and headaches.  All other systems reviewed and are negative.  Today's Vitals   11/01/22 0950  BP: 110/78  Pulse: 88  Temp: 98.8 F (  37.1 C)  TempSrc: Oral  SpO2: 97%  Weight: 186 lb (84.4 kg)  Height: '5\' 7"'$  (1.702 m)   Body mass index is 29.13 kg/m.   Physical Exam Vitals reviewed.  Constitutional:      Appearance: Normal appearance.  HENT:     Head: Normocephalic.  Eyes:     Extraocular Movements: Extraocular movements intact.  Cardiovascular:     Rate and Rhythm: Normal rate.  Pulmonary:     Effort: Pulmonary effort is normal.  Musculoskeletal:     Comments: Right lower extremity: Mild swelling.  No erythema.  No tenderness.  Full range of motion.  Neurovascularly intact.  Good capillary refill.  Warm to touch.  Good distal pulses.  Skin:    General: Skin is warm and dry.  Neurological:     General: No focal deficit present.     Mental Status: She is alert and oriented to person, place, and time.  Psychiatric:        Mood and Affect: Mood normal.        Behavior: Behavior normal.      ASSESSMENT & PLAN: A total of 33 minutes was spent with the patient and counseling/coordination of care regarding preparing for this visit, review of most recent office visit notes, review of chronic medical conditions under management, review of all medications, differential diagnosis of swelling of lower extremity and need for vein specialist evaluation, management of swelling, prognosis, documentation, and need for follow-up.  Problem List Items Addressed This Visit       Cardiovascular and Mediastinum   Chronic venous insufficiency of lower extremity    Continue leg elevation and compression socks. Probably has also  some degree of lymphedema. Recommend consult with vein specialist Referral placed today.      Relevant Orders   Ambulatory referral to Vascular Surgery     Other   Localized swelling of right lower leg - Primary    Differential diagnosis discussed. No signs of cellulitis. Doubt DVT but needs venous ultrasound to rule it out. Continue elevation and compression socks.      Relevant Orders   [facility] LOWER EXTREMITY VENOUS DVT RIGHT (BACK OFFICE)   [facility] [place] LOWER EXTREMITY VENOUS (DVT)   Patient Instructions  Chronic Venous Insufficiency Chronic venous insufficiency is a condition where the leg veins cannot effectively pump blood from the legs to the heart. This happens when the vein walls are either stretched, weakened, or damaged, or when the valves inside the vein are damaged. With the right treatment, you should be able to continue with an active life. This condition is also called venous stasis. What are the causes? Common causes of this condition include: High blood pressure inside the veins (venous hypertension). Sitting or standing too long, causing increased blood pressure in the leg veins. A blood clot that blocks blood flow in a vein (deep vein thrombosis, DVT). Inflammation of a vein (phlebitis) that causes a blood clot to form. Tumors in the pelvis that cause blood to back up. What increases the risk? The following factors may make you more likely to develop this condition: Having a family history of this condition. Obesity. Pregnancy. Living without enough regular physical activity or exercise (sedentary lifestyle). Smoking. Having a job that requires long periods of standing or sitting in one place. Being a certain age. Women in their 44s and 4s and men in their 72s are more likely to develop this condition. What are the signs or symptoms? Symptoms of this condition include:  Veins that are enlarged, bulging, or twisted (varicose veins). Skin breakdown or  ulcers. Reddened skin or dark discoloration of skin on the leg between the knee and ankle. Brown, smooth, tight, and painful skin just above the ankle, usually on the inside of the leg (lipodermatosclerosis). Swelling of the legs. How is this diagnosed? This condition may be diagnosed based on: Your medical history. A physical exam. Tests, such as: A procedure that creates an image of a blood vessel and nearby organs and provides information about blood flow through the blood vessel (duplex ultrasound). A procedure that tests blood flow (plethysmography). A procedure that looks at the veins using X-ray and dye (venogram). How is this treated? The goals of treatment are to help you return to an active life and to minimize pain or disability. Treatment depends on the severity of your condition, and it may include: Wearing compression stockings. These can help relieve symptoms and help prevent your condition from getting worse. However, they do not cure the condition. Sclerotherapy. This procedure involves an injection of a solution that shrinks damaged veins. Surgery. This may involve: Removing a diseased vein (vein stripping). Cutting off blood flow through the vein (laser ablation surgery). Repairing or reconstructing a valve within the affected vein. Follow these instructions at home:     Wear compression stockings as told by your health care provider. These stockings help to prevent blood clots and reduce swelling in your legs. Take over-the-counter and prescription medicines only as told by your health care provider. Stay active by exercising, walking, or doing different activities. Ask your health care provider what activities are safe for you and how much exercise you need. Drink enough fluid to keep your urine pale yellow. Do not use any products that contain nicotine or tobacco, such as cigarettes, e-cigarettes, and chewing tobacco. If you need help quitting, ask your health care  provider. Keep all follow-up visits as told by your health care provider. This is important. Contact a health care provider if you: Have redness, swelling, or more pain in the affected area. See a red streak or line that goes up or down from the affected area. Have skin breakdown or skin loss in the affected area, even if the breakdown is small. Get an injury in the affected area. Get help right away if: You get an injury and an open wound in the affected area. You have: Severe pain that does not get better with medicine. Sudden numbness or weakness in the foot or ankle below the affected area. Trouble moving your foot or ankle. A fever. Worse or persistent symptoms. Chest pain. Shortness of breath. Summary Chronic venous insufficiency is a condition where the leg veins cannot effectively pump blood from the legs to the heart. Chronic venous insufficiency occurs when the vein walls become stretched, weakened, or damaged, or when valves within the vein are damaged. Treatment depends on how severe your condition is. It often involves wearing compression stockings and may involve having a procedure. Make sure you stay active by exercising, walking, or doing different activities. Ask your health care provider what activities are safe for you and how much exercise you need. This information is not intended to replace advice given to you by your health care provider. Make sure you discuss any questions you have with your health care provider. Document Revised: 01/25/2021 Document Reviewed: 01/25/2021 Elsevier Patient Education  Gideon, MD [facility]

## 2022-11-01 NOTE — Addendum Note (Signed)
Addended by: Rae Mar on: 11/01/2022 01:08 PM   Modules accepted: Orders

## 2022-11-01 NOTE — Assessment & Plan Note (Signed)
Continue leg elevation and compression socks. Probably has also some degree of lymphedema. Recommend consult with vein specialist Referral placed today.

## 2022-11-02 ENCOUNTER — Encounter (HOSPITAL_COMMUNITY): Payer: BC Managed Care – PPO

## 2022-11-03 ENCOUNTER — Other Ambulatory Visit: Payer: Self-pay | Admitting: Internal Medicine

## 2022-11-03 ENCOUNTER — Ambulatory Visit (INDEPENDENT_AMBULATORY_CARE_PROVIDER_SITE_OTHER): Payer: BC Managed Care – PPO | Admitting: Physician Assistant

## 2022-11-03 VITALS — BP 126/69 | HR 78 | Temp 98.2°F | Resp 20 | Ht 67.0 in | Wt 186.9 lb

## 2022-11-03 DIAGNOSIS — R2241 Localized swelling, mass and lump, right lower limb: Secondary | ICD-10-CM

## 2022-11-03 DIAGNOSIS — Z86718 Personal history of other venous thrombosis and embolism: Secondary | ICD-10-CM | POA: Diagnosis not present

## 2022-11-03 DIAGNOSIS — M7989 Other specified soft tissue disorders: Secondary | ICD-10-CM

## 2022-11-03 NOTE — Progress Notes (Signed)
Office Note     CC:  follow up Requesting Provider:  Horald Pollen, *  HPI: Christy Lowe is a 41 y.o. (11/11/1981) female who presents for evaluation of increasing right leg swelling.  Past medical history also significant for lupus.  She has a history of right leg DVT about 13 or 14 years ago however she forgets the details regarding this.  She tries to wear knee-high compression however this causes discomfort to her knee especially behind the knee.  She has had elective orthopedic procedures to both knees in the past.  She does not elevate her legs during the day as she is required to be on her feet throughout most of her working day.  She denies any venous ulcerations, trauma, or any prior vascular interventions.  She will underwent a right leg venous duplex as well as a reflux study at the [facility] imaging center.  She has not ever been prescribed a diuretic to help with swelling.  She denies tobacco use.  She has a follow-up with her endocrinologist next week.    Past Medical History:  Diagnosis Date   aub    Bruises easily 48/18/5631   Complication of anesthesia 10/15/2020   hypotension   Fibromyalgia    GERD (gastroesophageal reflux disease)    Heart murmur  PFO    off 81 mg aspirin since feb 2022 per pcp  no cardiologist   Hemangioma    History of DVT (deep vein thrombosis)    right le 7 yrs ago, right leg slight swollen   History of endometriosis    History of hiatal hernia    Hypotension    with 10-15-2020 endoscopy   Migraine without aura    Systemic lupus erythematosus ([place])    TIA    ages 44 and 89   Viral meningitis    age 71    Past Surgical History:  Procedure Laterality Date   ABDOMINAL HYSTERECTOMY     ANTERIOR CRUCIATE LIGAMENT REPAIR Left 10/2018   CYSTOSCOPY  10/31/2021   Procedure: CYSTOSCOPY;  Surgeon: Salvadore Dom, MD;  Location: [place];  Service: Gynecology;;   PELVIC LAPAROSCOPY     for endometriosis yrs  ago per pt on 10-24-2021   right knee lateral release and meniscal repair  2011   TONSILLECTOMY     age 30, adenoids removed also   TOTAL LAPAROSCOPIC HYSTERECTOMY WITH SALPINGECTOMY Bilateral 10/31/2021   Procedure: TOTAL LAPAROSCOPIC HYSTERECTOMY WITH SALPINGECTOMY;  Surgeon: Salvadore Dom, MD;  Location: [facility];  Service: Gynecology;  Laterality: Bilateral;   UMBILICAL HERNIA REPAIR     yrs ago   UPPER GI ENDOSCOPY     with esophagus stretching 10-15-2020, 03-11-2020, 05-09-2019,  2013 with being polyp removed    Social History   Socioeconomic History   Marital status: Single    Spouse name: Not on file   Number of children: Not on file   Years of education: Not on file   Highest education level: Not on file  Occupational History   Not on file  Tobacco Use   Smoking status: Never    Passive exposure: Never   Smokeless tobacco: Never  Vaping Use   Vaping Use: Never used  Substance and Sexual Activity   Alcohol use: Not Currently    Alcohol/week: 2.0 standard drinks of alcohol    Types: 2 Glasses of wine per week   Drug use: Never   Sexual activity: Yes  Birth control/protection: Surgical    Comment: partner with vasectomy  Other Topics Concern   Not on file  Social History Narrative   Not on file   Social Determinants of Health   Financial Resource Strain: Not on file  Food Insecurity: Not on file  Transportation Needs: Not on file  Physical Activity: Not on file  Stress: Not on file  Social Connections: Not on file  Intimate Partner Violence: Not on file    Family History  Problem Relation Age of Onset   Alcohol abuse Mother    Cancer Father        Lung and Colon   Heart attack Father    COPD Father    Breast cancer Paternal Aunt        19's   Ovarian cancer Paternal Aunt        40's   Diabetes Maternal Grandmother    Cervical cancer Maternal Grandmother    Fibromyalgia Maternal Grandmother    Breast cancer Maternal  Grandmother        ? age of dx   Prostate cancer Maternal Grandfather    Heart attack Maternal Grandfather    ALS Paternal Grandmother    Prostate cancer Paternal Grandfather    Heart attack Paternal Grandfather    Esophageal cancer Paternal Grandfather    Breast cancer Other    Colon cancer Neg Hx    Stomach cancer Neg Hx    Rectal cancer Neg Hx     Current Outpatient Medications  Medication Sig Dispense Refill   Anifrolumab-fnia 300 MG/2ML SOLN Inject 300 mg into the vein every 28 (twenty-eight) days.     cyclobenzaprine (FLEXERIL) 10 MG tablet TAKE 1 TABLET BY MOUTH EVERY NIGHT AT BEDTIME AS NEEDED FOR MUSCLE SPASMS 30 tablet 2   hydroxychloroquine (PLAQUENIL) 200 MG tablet TAKE 2 TABLETS BY MOUTH EVERY DAY 180 tablet 1   hydrOXYzine (ATARAX) 25 MG tablet TAKE AT NIGHT OR UP TO 2 TIMES DAILY AS NEEDED FOR ITCHING 180 tablet 1   triamcinolone cream (KENALOG) 0.1 % Apply topically 2 (two) times daily.     No current facility-administered medications for this visit.    Allergies  Allergen Reactions   Cephalexin Hives and Shortness Of Breath   Penicillins Hives and Anaphylaxis    SOB As child    Amitiza [Lubiprostone]     N/v   Sulfa Antibiotics Other (See Comments)    As child Doesn't remember      REVIEW OF SYSTEMS:   '[X]'$  denotes positive finding, '[ ]'$  denotes negative finding Cardiac  Comments:  Chest pain or chest pressure:    Shortness of breath upon exertion:    Short of breath when lying flat:    Irregular heart rhythm:        Vascular    Pain in calf, thigh, or hip brought on by ambulation:    Pain in feet at night that wakes you up from your sleep:     Blood clot in your veins:    Leg swelling:         Pulmonary    Oxygen at home:    Productive cough:     Wheezing:         Neurologic    Sudden weakness in arms or legs:     Sudden numbness in arms or legs:     Sudden onset of difficulty speaking or slurred speech:    Temporary loss of vision in  one eye:  Problems with dizziness:         Gastrointestinal    Blood in stool:     Vomited blood:         Genitourinary    Burning when urinating:     Blood in urine:        Psychiatric    Major depression:         Hematologic    Bleeding problems:    Problems with blood clotting too easily:        Skin    Rashes or ulcers:        Constitutional    Fever or chills:      PHYSICAL EXAMINATION:  Vitals:   11/03/22 1330  BP: 126/69  Pulse: 78  Resp: 20  Temp: 98.2 F (36.8 C)  TempSrc: Temporal  SpO2: 100%  Weight: 186 lb 14.4 oz (84.8 kg)  Height: '5\' 7"'$  (1.702 m)    General:  WDWN in NAD; vital signs documented above Gait: Not observed HENT: WNL, normocephalic Pulmonary: normal non-labored breathing , without Rales, rhonchi,  wheezing Cardiac: regular HR Abdomen: soft, NT, no masses Skin: without rashes Vascular Exam/Pulses:  Right Left  DP 2+ (normal) 2+ (normal)   Extremities: without ischemic changes, without Gangrene , without cellulitis; without open wounds; pitting edema around the right ankle; no ropey varicosities, prominent spider veins, pigmentation changes, or indurated skin. Musculoskeletal: no muscle wasting or atrophy  Neurologic: A&O X 3;  No focal weakness or paresthesias are detected Psychiatric:  The pt has Normal affect.   Non-Invasive Vascular Imaging:   Right leg venous duplex negative for DVT No reflux was noted in the GSV, small saphenous vein, or deep system    ASSESSMENT/PLAN:: 41 y.o. 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  Study was also negative for deep or superficial venous reflux -Unfortunately from a vascular surgery standpoint there is not much to offer other than conservative measures including regular use of compression stockings.  Gold standard  would be wearing thigh-high compression however the patient would only be agreeable to knee-high compression.  She was measured for and fitted and knee-high compression today.  I also recommended CircAid wraps or Ace wraps if she finds wearing compression socks to be difficult.  Recommendation also includes periodic elevation of the legs and this was discussed in detail.  This is understandably difficult to perform during the day however I encouraged her to set aside 10 to 15 minutes to do this especially after finishing work.  She will also need to avoid prolonged sitting and standing if possible.  NSAIDs can be used when discomfort associated with edema is at its worst.  Lastly we discussed maintaining a healthy weight to help venous outflow from the legs.  I will forward my note to her endocrinologist and PCP to see if she can be tried on a short course of diuretic to see if this helps with her edema.  She will need surveillance of her kidney function in this scenario.  Patient can otherwise follow-up on an as-needed basis.  We talked about the possibility of repeating a reflux study in the future however this should not be performed before at least another calendar year.   Dagoberto Ligas, PA-C [facility] [phone]  Clinic MD:   Virl Cagey

## 2022-11-05 IMAGING — MG DIGITAL DIAGNOSTIC BILAT W/ TOMO W/ CAD
8 of 14 series · 8 of 40 positions shown · non-contrast
Comparison: None.

CLINICAL DATA: Patient presents for a bilateral diagnostic exam due
to a palpable abnormality over the inner upper right breast. Patient
has history of lupus undergoing immunosuppressive infusions.

EXAM:
DIGITAL DIAGNOSTIC BILATERAL MAMMOGRAM WITH TOMOSYNTHESIS AND CAD;
ULTRASOUND LEFT BREAST LIMITED
TECHNIQUE: Bilateral digital diagnostic mammography and breast tomosynthesis
was performed. The images were evaluated with computer-aided
detection.; Targeted ultrasound examination of the left breast was
performed.

[L CC synth-2D (1 of 2)]
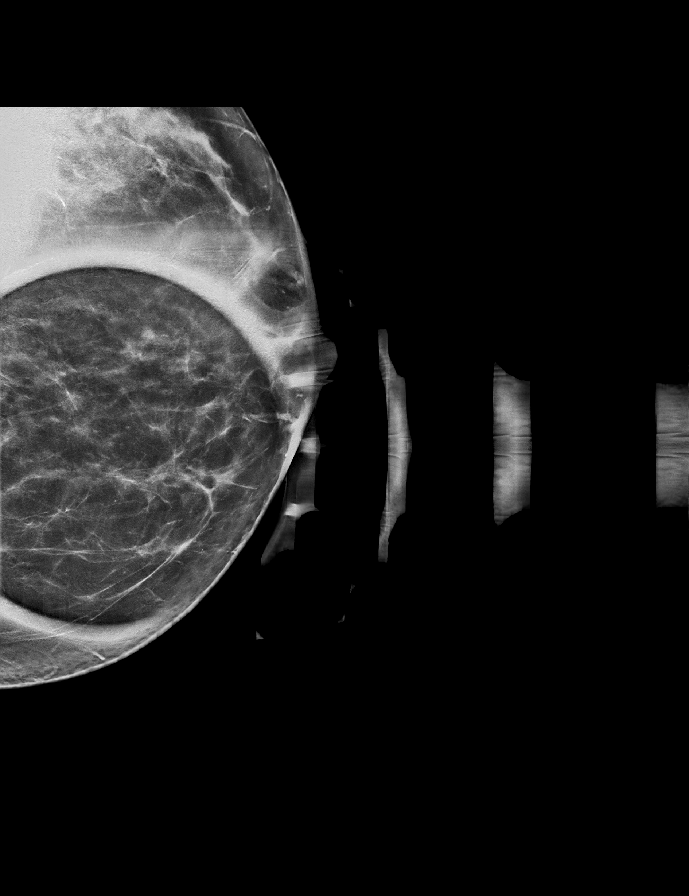

[L ML synth-2D]
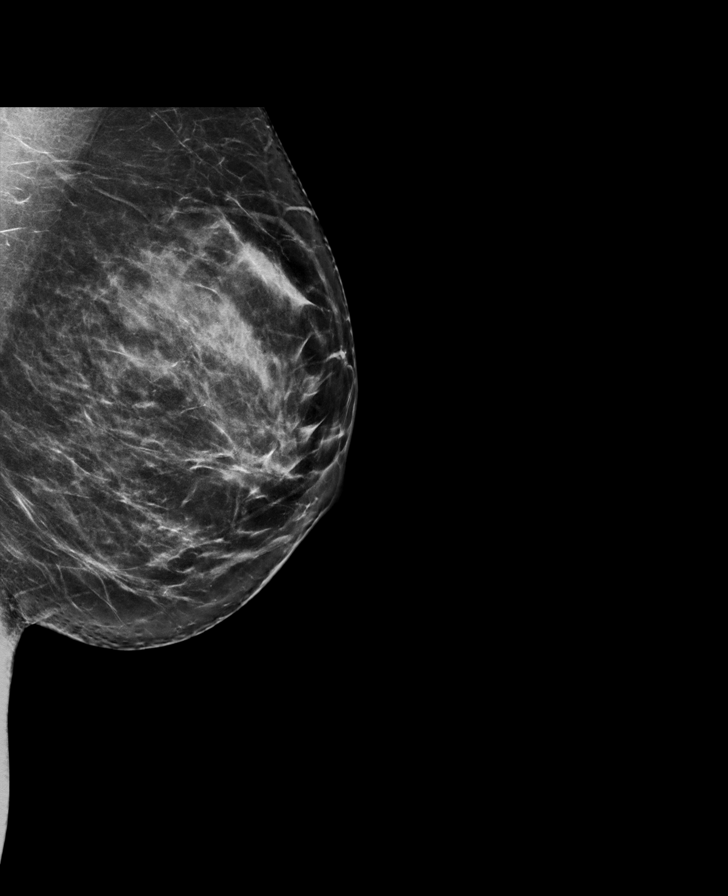

[R MLO synth-2D]
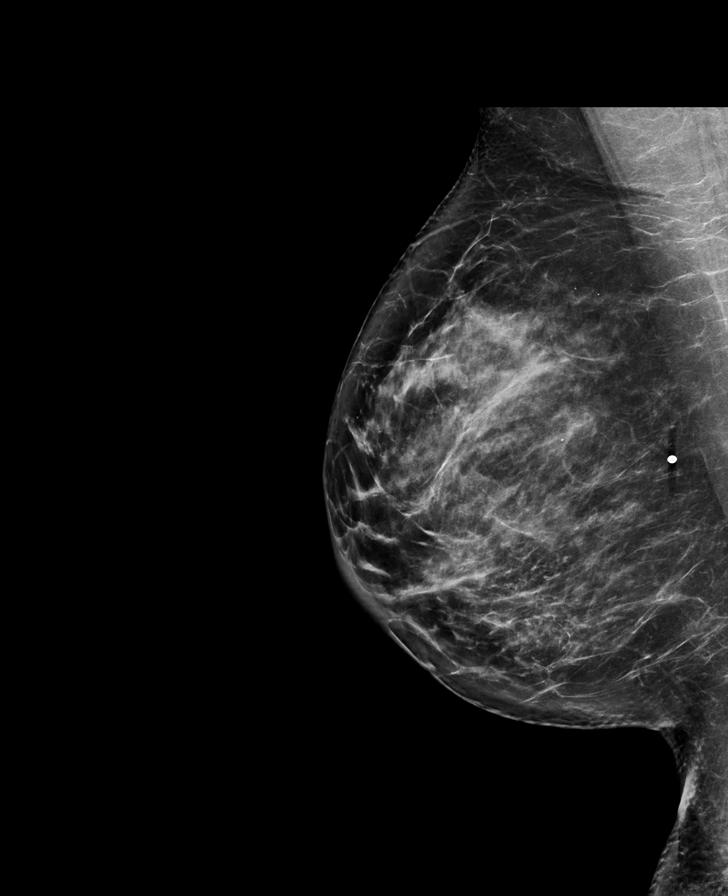

[R CC synth-2D (1 of 2)]
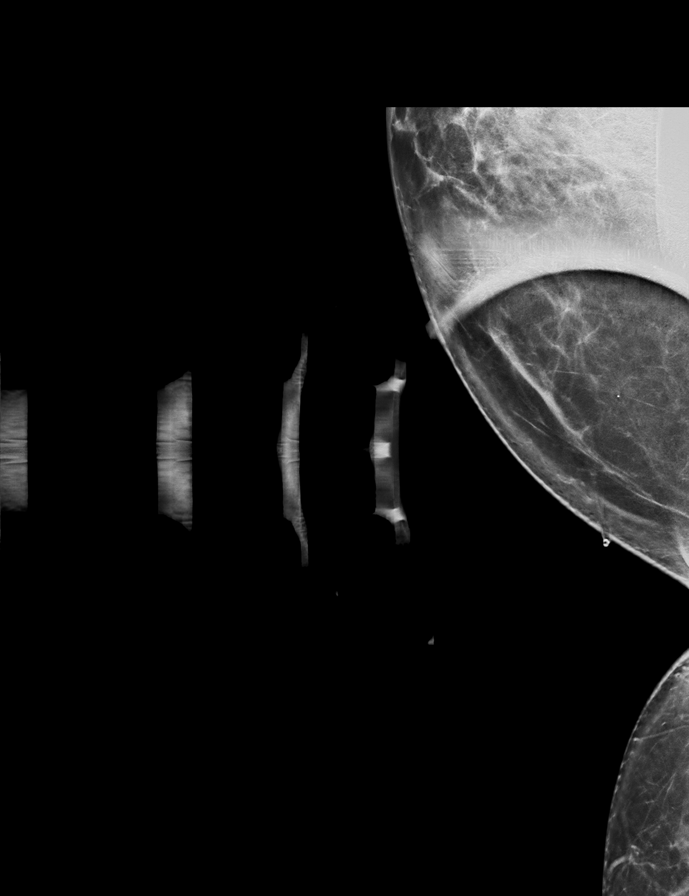

[L CC synth-2D (2 of 2)]
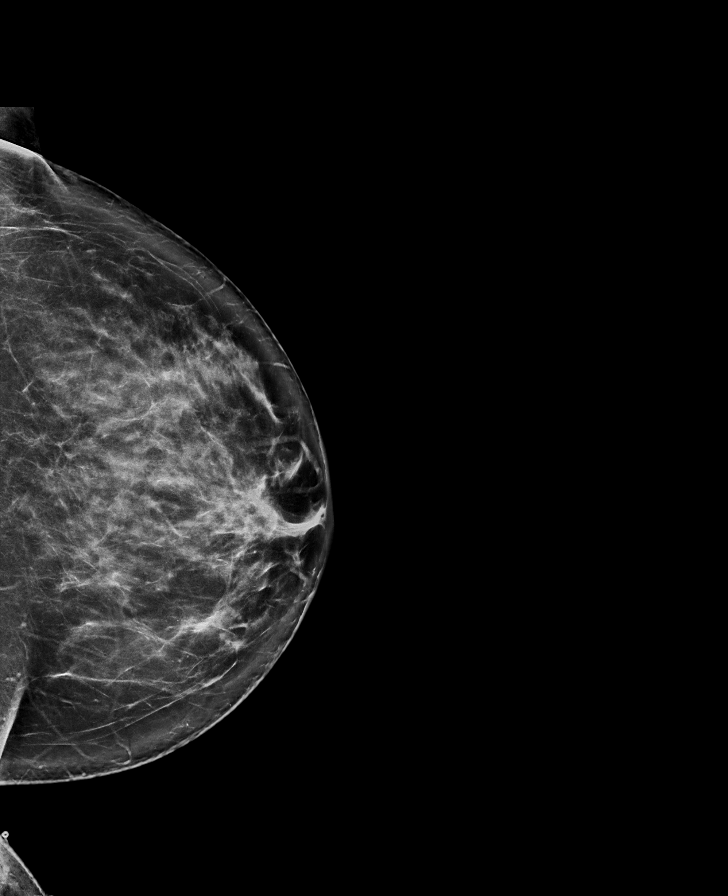

[R CC synth-2D (2 of 2)]
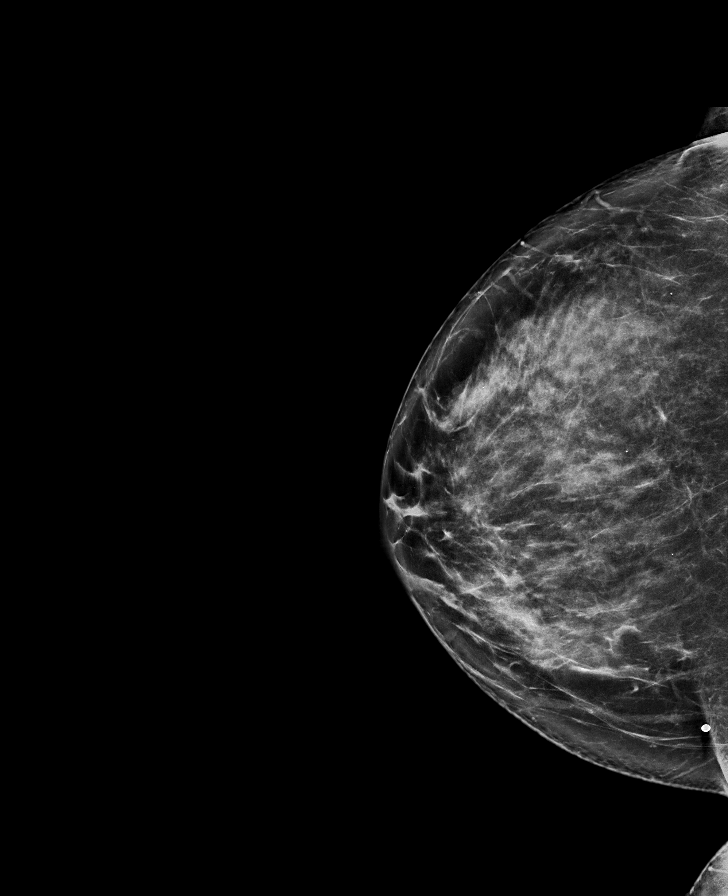

[L MLO synth-2D]
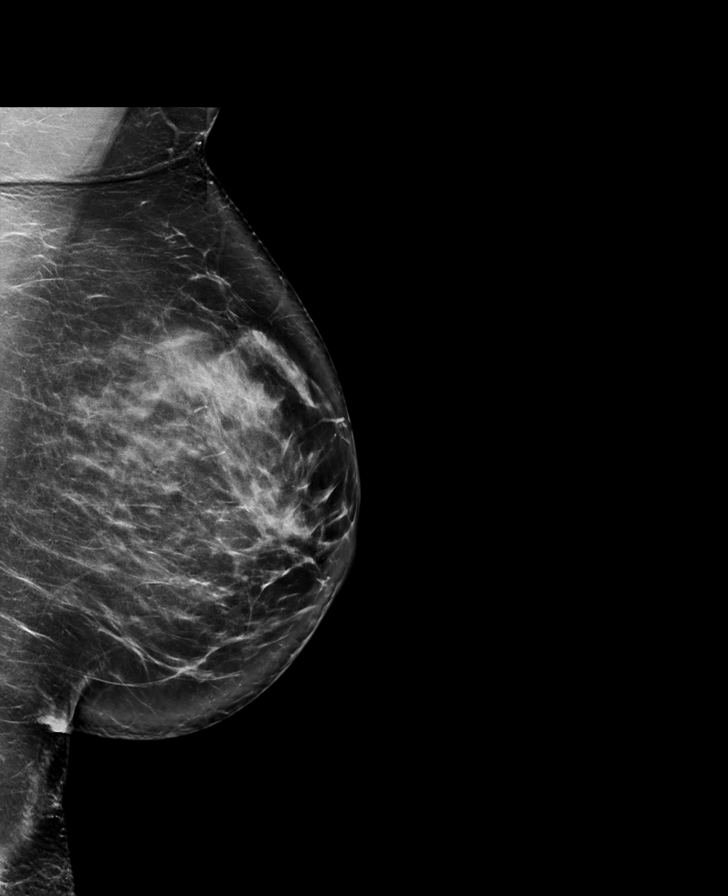

[R CC tomo · tomo slice 38/75.0]
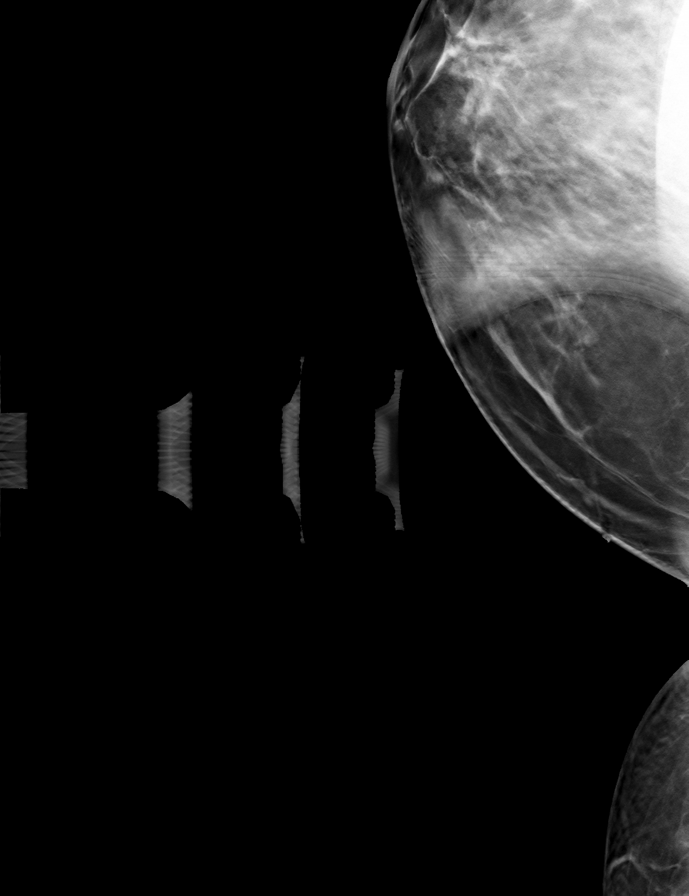

[8 of 40 positions shown; findings below may reference images not displayed]

ACR Breast Density Category c: The breast tissue is heterogeneously
dense, which may obscure small masses.
FINDINGS: Examination demonstrates no focal abnormality over the posterior
inner upper right breast to account for patient's palpable
abnormality. Remainder of the right breast is normal. Asymmetry over
the anteromedial left breast is less apparent on spot compression
imaging. This likely represents asymmetric fibroglandular tissue.
Remainder of the left breast is unremarkable.

Targeted ultrasound is performed, showing no focal abnormality over
the upper inner right breast to account for patient's palpable
abnormality.

Ultrasound over the medial left breast from the 8 o'clock to the 10
o'clock position demonstrates no focal abnormality.
IMPRESSION: No focal abnormality over the inner upper quadrant of the right
breast to account for patient's palpable abnormality.

RECOMMENDATION:
Recommend continued management of patient's right breast palpable
abnormality on a clinical basis. Otherwise, recommend beginning
annual screening mammography at age 42.

I have discussed the findings and recommendations with the patient.
If applicable, a reminder letter will be sent to the patient
regarding the next appointment.

BI-RADS CATEGORY  1: Negative.

## 2022-11-06 NOTE — Telephone Encounter (Signed)
Next Visit: 11/07/2022  Last Visit: 08/09/2022  Last Fill: 06/30/2022  Dx: ystemic lupus erythematosus, unspecified SLE type   Current Dose per office note on 08/09/2022: not discussed   Okay to refill Flexeril?

## 2022-11-06 NOTE — Progress Notes (Addendum)
Office Visit Note  Patient: Christy Lowe             Date of Birth: 06-Nov-1981           MRN: [id number]             PCP: Horald Pollen, MD Referring: Horald Pollen, * Visit Date: 11/07/2022   Subjective:  Follow-up (Right leg edema)   History of Present Illness: Christy Lowe is a 41 y.o. female here for follow up for SLE on HCQ 400 mg daily and saphnelo 300 mg IV monthly. Symptoms including arthritis, rashes, mucosal ulcers and pedal edema.  She is noticing a good benefit with the Saphnelo infusions but describes pain in her joints and generalized sensitivity gets worse again by at least 1 week before each next treatment is due.  Noticing a few new areas of swelling of her hands and skin discoloration on her hands the rashes around her head and neck are about the same.  She had recent evaluation at [facility] with no superficial veins amenable to procedure intervention identified.  The swelling is becoming faster to develop and somewhat more extensive she notices mild discoloration around the lower shin and ankle described a bluish hue when very swollen.  She quit her job due to these problems knowing that the stress of working continuous 12-hour shifts was worsening symptoms and having to spend some many months in [place] or travel throughout the country was very problematic for maintaining medication access.   Previous HPI 08/09/22 Christy Lowe is a 41 y.o. female here for follow up for lupus on HCQ 400 mg daily and saphnelo 300 mg IV monthly.  Her joint symptoms have remained pretty well controlled after completely tapering off the prednisone.  She is noticing a difference in symptoms after the Saphnelo infusion versus 4 weeks later proceeding her next dose.  Continues to have very little appetite but not seeing any weight reduction.  Skin itching especially on her upper legs is a persistent issue she is using topical emollients with partial benefit.  More recently she  is also experiencing an increase in urinary frequency both daytime and increased nocturia.  No visible blood in urine or dysuria.  She is currently anticipating an upcoming plastic surgery needing adjustments for perioperative drug management.   Previous HPI 05/01/2022 Christy Lowe is a 41 y.o. female here for follow up for lupus on HCQ 400 mg daily started saphnelo infusion and prednisone tapering down to 2.5 mg daily now. So far she is noticing some improvement with overall severity of skin redness swelling and itching but definitely not resolved.  Face and chest continue having the most erythema.  She has noticed some bruising without any specific cause.  She is noticing some swelling or otherwise thickening in the joints of her right hand and around the right wrist.   Previous HPI 04/06/2022 Christy Lowe is a 41 y.o. female here for follow up for lupus on HCQ 400 mg daily started saphnelo infusion last month and prednisone tapering down to 2.5 mg daily now. She felt a large improvement after her first treatment with decreased joint pains and decreased abdominal pain. She is tapering off the prednisone without severe problem. She has noticed oral ulcers on the top of her mouth for several days and is having a rash with small papules on both sides of her neck and lower face. Appetite is decreased but not losing any weight. She feels a  lot of stress after dealing with work incident.   Previous HPI 03/06/2022 Christy Lowe is a 41 y.o. female here for follow up for lupus on HCQ 400 mg daily and prednisone tapering down to 10 mg daily after increase to 30 last month from flare up with mouth ulcers and diffuse rashes. She had several complications with high dose prednisone increased weight, mood irritability, and wound dehiscence af hysterectomy cuff site. We discussed plan to start saphenlo infusion as a replacement for benlysta treatment which did not improve symptoms or lab abnormalities much  after about 6 months treatment.  She has some improvement in the mouth and lip ulcers she is not sure whether starting the valacyclovir about 4 days ago has had significant effect.  She completed the course of oral antibiotics abdominal pain is partially better. She is scheduled for first saphnelo infusion Thursday and has OBGYN followup later this week.     Previous HPI 12/13/21 Christy Lowe is a 41 y.o. female here for follow up for systemic lupus with ongoing joint pains and skin rash on HCQ 400 mg daily, Benlysta 200 mg [place] weekly, and prednisone 10 mg daily. Since our last visit she went for hysterectomy this was uneventful and has recovered well. She was off the benlysta for about 1 month in total around the surgery. She has not felt much improvement so far still having a lot of fatigue, skin rash and flushing, joint pain in several areas with left wrist swelling. Headaches are about the same as before but now having recurrent episodes of vertigo. She is now taking hydroxyzine twice on some days due to persistent itching, and feels excessively hot most of the time. She Is back to taking 10 mg of prednisone when trying to drop this to 5 mg felt extreme fatigue and back pain that felt like bruising and stiffness all over her body.   Previous HPI 11/17/20 Christy Lowe is a 41 y.o. female here for evaluation of positive ANA. She is noticing multiple symptoms including generalized alopecia, oral dryness, left sided neck swelling, joint pain and stiffness, position tremor, and episodic skin rashes. She has had multiple ongoing problems since many years ago. She was hospitalized for viral meningitis at age 19 and feels she has been susceptible to infections ever since this time or before, with numerous cases of bronchitis and has had some lung nodules on CT imaging during these repeat episodes. About 7 years ago she suffered from a RLE DVT provoked after right knee arthroscopy and developed right  sided face and arm numbness. Workup indicated she had a TIA also noted to have PFO. She takes ASA since that time and has taken anticoagulation in perioperative periods. Workup with MRI and LP studies were apparently also indicative for MS. She was treated with multiple rounds of steroids for months at a time and felt a lot of mood disturbance from these. She was never started on other immunomodulatory or immunosuppressive treatments.   Review of Systems  Constitutional:  Positive for fatigue.  HENT:  Positive for mouth sores. Negative for mouth dryness.   Eyes:  Negative for dryness.  Respiratory:  Negative for shortness of breath.   Cardiovascular:  Negative for chest pain and palpitations.  Gastrointestinal:  Negative for blood in stool, constipation and diarrhea.  Endocrine: Negative for increased urination.  Genitourinary:  Positive for involuntary urination.  Musculoskeletal:  Positive for joint pain, joint pain, joint swelling, myalgias, morning stiffness, muscle tenderness and myalgias.  Negative for gait problem and muscle weakness.  Skin:  Positive for rash, hair loss and sensitivity to sunlight. Negative for color change.  Allergic/Immunologic: Positive for susceptible to infections.  Neurological:  Negative for dizziness and headaches.  Hematological:  Negative for swollen glands.  Psychiatric/Behavioral:  Positive for sleep disturbance. Negative for depressed mood. The patient is nervous/anxious.     PMFS History:  Patient Active Problem List   Diagnosis Date Noted   Localized swelling of right lower leg 11/01/2022   Chronic venous insufficiency of lower extremity 11/01/2022   Hyperglycemia 05/23/2022   Obesity without serious comorbidity 05/23/2022   Rash and other nonspecific skin eruption 04/06/2022   Leukocytosis 03/06/2022   Redness 11/14/2021   Blurry vision 11/07/2021   Hot flash not due to menopause 11/04/2021   S/P laparoscopic hysterectomy 10/31/2021   Urinary  urgency 09/09/2021   Itching 09/09/2021   High risk medication use 07/25/2021   Finger joint swelling, left 04/01/2021   Systemic lupus erythematosus ([place]) 04/01/2021   Fibromyalgia 04/01/2021   Alopecia 11/17/2020   Other fatigue 10/12/2020   Migraine without aura    History of DVT (deep vein thrombosis)    History of endometriosis    Right-sided muscle weakness 05/15/2019   Abnormal finding on MRI of brain 05/15/2019   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   Tremor 12/24/2017    Past Medical History:  Diagnosis Date   aub    Bruises easily [id number]   Complication of anesthesia 10/15/2020   hypotension   Fibromyalgia    GERD (gastroesophageal reflux disease)    Heart murmur  PFO    off 81 mg aspirin since feb 2022 per pcp  no cardiologist   Hemangioma    History of DVT (deep vein thrombosis)    right le 7 yrs ago, right leg slight swollen   History of endometriosis    History of hiatal hernia    Hypotension    with 10-15-2020 endoscopy   Long-term corticosteroid use 02/20/2022   Corticosteroid   Migraine without aura    Systemic lupus erythematosus ([place])    TIA    ages 16 and 52   Vascular insufficiency    Viral meningitis    age 62    Family History  Problem Relation Age of Onset   Alcohol abuse Mother    Cancer Father        Lung and Colon   Heart attack Father    COPD Father    Breast cancer Paternal Aunt        1's   Ovarian cancer Paternal Aunt        40's   Diabetes Maternal Grandmother    Cervical cancer Maternal Grandmother    Fibromyalgia Maternal Grandmother    Breast cancer Maternal Grandmother        ? age of dx   Prostate cancer Maternal Grandfather    Heart attack Maternal Grandfather    ALS Paternal Grandmother    Prostate cancer Paternal Grandfather    Heart attack Paternal Grandfather    Esophageal cancer Paternal Grandfather    Breast cancer Other    Colon cancer Neg Hx    Stomach cancer Neg Hx    Rectal cancer Neg Hx    Past  Surgical History:  Procedure Laterality Date   ABDOMINAL HYSTERECTOMY     ANTERIOR CRUCIATE LIGAMENT REPAIR Left 10/2018   CYSTOSCOPY  10/31/2021   Procedure: CYSTOSCOPY;  Surgeon: Salvadore Dom, MD;  Location: [facility];  Service: Gynecology;;   PELVIC LAPAROSCOPY     for endometriosis yrs ago per pt on 10-24-2021   right knee lateral release and meniscal repair  2011   TONSILLECTOMY     age 63, adenoids removed also   TOTAL LAPAROSCOPIC HYSTERECTOMY WITH SALPINGECTOMY Bilateral 10/31/2021   Procedure: TOTAL LAPAROSCOPIC HYSTERECTOMY WITH SALPINGECTOMY;  Surgeon: Salvadore Dom, MD;  Location: [facility];  Service: Gynecology;  Laterality: Bilateral;   UMBILICAL HERNIA REPAIR     yrs ago   UPPER GI ENDOSCOPY     with esophagus stretching 10-15-2020, 03-11-2020, 05-09-2019,  2013 with being polyp removed   Social History   Social History Narrative   Not on file   Immunization History  Administered Date(s) Administered   PFIZER(Purple Top)SARS-COV-2 Vaccination 06/27/2020, 07/28/2020   PPD Test 01/23/2019     Objective: Vital Signs: BP 109/76 (BP Location: Left Arm, Patient Position: Sitting, Cuff Size: Normal)   Pulse (!) 156   Resp 15   Ht '5\' 7"'$  (1.702 m)   Wt 185 lb (83.9 kg)   LMP 10/10/2021   BMI 28.98 kg/m    Physical Exam HENT:     Mouth/Throat:     Mouth: Mucous membranes are moist.     Pharynx: Oropharynx is clear.  Eyes:     Conjunctiva/sclera: Conjunctivae normal.  Cardiovascular:     Rate and Rhythm: Normal rate and regular rhythm.  Pulmonary:     Effort: Pulmonary effort is normal.     Breath sounds: Normal breath sounds.  Musculoskeletal:     Right lower leg: Edema present.     Left lower leg: No edema.     Comments: 1+ pitting edema in right lower leg  Skin:    General: Skin is warm and dry.     Findings: Rash present.     Comments: Multiple round minimally raised bumps around the sides and back of her  neck and below both the ears, several with excoriations  Neurological:     Mental Status: She is alert.  Psychiatric:        Mood and Affect: Mood normal.      Musculoskeletal Exam:  Shoulders full ROM no tenderness or swelling Elbows full ROM no tenderness or swelling Wrists full ROM no tenderness or swelling Fingers full ROM tenderness to pressure and mild erythema overlying several finger joints on the dorsal side, soft tissue swelling between the right second and third MCP joints Knees full ROM no tenderness or swelling Ankles full ROM no tenderness or swelling   Investigation: No additional findings.  Imaging: VAS [place] LOWER EXTREMITY VENOUS (DVT)  Result Date: 11/03/2022  Lower Venous DVT Study Patient Name:  Christy Lowe  Date of Exam:   11/01/2022 Medical Rec #: [id number]          Accession #:    [id number] Date of Birth: 04-24-1981          Patient Gender: F Patient Age:   77 years Exam Location:  [facility] Procedure:      VAS [place] LOWER EXTREMITY VENOUS (DVT) Referring Phys: MIGUEL SAGARDIA --------------------------------------------------------------------------------  Indications: Right lower extremity swelling x 5 days. Patient denies any SOB.  Risk Factors: DVT h/o the right lower extremity in 2014. Comparison Study: NA Performing Technologist: Sharlett Iles RVT  Examination Guidelines: A complete evaluation includes B-mode imaging, spectral Doppler, color Doppler, and power Doppler as needed of all accessible portions of each vessel. Bilateral testing is considered  an integral part of a complete examination. Limited examinations for reoccurring indications may be performed as noted. The reflux portion of the exam is performed with the patient in reverse Trendelenburg.  +---------+---------------+---------+-----------+----------+--------------+ RIGHT    CompressibilityPhasicitySpontaneityPropertiesThrombus Aging  +---------+---------------+---------+-----------+----------+--------------+ CFV      Full           Yes      Yes                                 +---------+---------------+---------+-----------+----------+--------------+ SFJ      Full           Yes      Yes                                 +---------+---------------+---------+-----------+----------+--------------+ FV Prox  Full           Yes      Yes                                 +---------+---------------+---------+-----------+----------+--------------+ FV Mid   Full                                                        +---------+---------------+---------+-----------+----------+--------------+ FV DistalFull           Yes      Yes                                 +---------+---------------+---------+-----------+----------+--------------+ PFV      Full                    Yes                                 +---------+---------------+---------+-----------+----------+--------------+ POP      Full           Yes      Yes                                 +---------+---------------+---------+-----------+----------+--------------+ PTV      Full                                                        +---------+---------------+---------+-----------+----------+--------------+ PERO     Full                                                        +---------+---------------+---------+-----------+----------+--------------+ Gastroc  Full                                                        +---------+---------------+---------+-----------+----------+--------------+  GSV      Full           Yes      Yes                                 +---------+---------------+---------+-----------+----------+--------------+   +----+---------------+---------+-----------+----------+--------------+ LEFTCompressibilityPhasicitySpontaneityPropertiesThrombus Aging  +----+---------------+---------+-----------+----------+--------------+ CFV Full           Yes      Yes                                 +----+---------------+---------+-----------+----------+--------------+     Summary: RIGHT: - No evidence of deep vein thrombosis in the lower extremity. No indirect evidence of obstruction proximal to the inguinal ligament. - There is no evidence of superficial venous thrombosis. - No cystic structure found in the popliteal fossa. - No evidence of venous insufficiency in the great or small saphenous veins.  LEFT: - No evidence of common femoral vein obstruction.  *See table(s) above for measurements and observations. Electronically signed by Harold Barban MD on 11/01/2022 at 8:49:34 PM.    Final (Updated)     Recent Labs: Lab Results  Component Value Date   WBC 5.5 08/09/2022   HGB 14.1 08/09/2022   PLT 272 08/09/2022   NA 138 08/09/2022   K 4.5 08/09/2022   CL 103 08/09/2022   CO2 27 08/09/2022   GLUCOSE 82 08/09/2022   BUN 12 08/09/2022   CREATININE 1.07 (H) 08/09/2022   BILITOT 0.3 08/09/2022   ALKPHOS 47 10/26/2021   AST 33 (H) 08/09/2022   ALT 35 (H) 08/09/2022   PROT 6.7 08/09/2022   ALBUMIN 4.9 10/26/2021   CALCIUM 9.8 08/09/2022   GFRAA >60 03/10/2020   QFTBGOLDPLUS NEGATIVE 07/25/2021    Speciality Comments: PLQ Eye Exam [facility] 07/03/2022 WNL Follow-up: 12 months   Procedures:  No procedures performed Allergies: Cephalexin, Penicillins, Amitiza [lubiprostone], and Sulfa antibiotics   Assessment / Plan:     Visit Diagnoses: Systemic lupus erythematosus, unspecified SLE type, unspecified organ involvement status ([place]) - Plan: Anti-DNA antibody, double-stranded, Sedimentation rate, Protein / creatinine ratio, urine  Symptoms appear about the same compared to last visit but having some worsened problems with the right leg edema.  Will recheck double-stranded ENA sedimentation rate for disease activity monitoring.   Rechecking urine protein creatinine ratio screening for proteinuria especially with increased edema.  Plan to continue hydroxychloroquine 400 mg daily and Saphnelo 300 mg IV monthly. Addendum: With persistent positive dsDNA and active rashes SLEDAI score is 4. Some disease activity but with manageable symptoms and multiple previous ineffective treatments plan to continue current regimen.  Rash and other nonspecific skin eruption  Some ongoing pruritic skin rashes about the same compared to last visit.  Mild erythema on the backs of the hands looks to be more suspicious for dry skin changes or possible mild atopic dermatitis versus inflammatory rash.  High risk medication use - Plan: CBC with Differential/Platelet, COMPLETE METABOLIC PANEL WITH GFR  Checking CBC and CMP for medication monitoring continuing hydroxychloroquine and Saphnelo.  Most recent eye exam in August was fine for 1 year follow-up.  Chronic venous insufficiency of lower extremity - Plan: furosemide (LASIX) 20 MG tablet History of DVT (deep vein thrombosis)  Ongoing right lower extremity edema symptoms have gotten somewhat worse without ulcers or severe complication.  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  Less likely lymphedema with the ultimate underlying cause and unilateral pitting edema presentation.  Recommend trial of adding 20 mg Lasix daily as needed for this.  Discussed medication in detail and discussed side effects to watch out for such as dehydration, urinary frequency with her history of urgency, and watching out for symptoms of muscle cramps or weakness.  Urinary urgency  She has noticed more frequent urinary urgency occasionally with slight incontinence for this is primarily been after her hysterectomy.  I recommend her to watch out with urgency symptoms as diuretic medications will require increased frequency.  Orders: Orders Placed This Encounter  Procedures    Anti-DNA antibody, double-stranded   Sedimentation rate   CBC with Differential/Platelet   COMPLETE METABOLIC PANEL WITH GFR   Protein / creatinine ratio, urine   Meds ordered this encounter  Medications   furosemide (LASIX) 20 MG tablet    Sig: Take 1 tablet (20 mg total) by mouth daily as needed.    Dispense:  30 tablet    Refill:  2     Follow-Up Instructions: Return in about 3 months (around 02/06/2023) for SLE on HCQ/saphnelo/lasix start f/u 45mo.   CCollier Salina MD  Note - This record has been created using [employer]  Chart creation errors have been sought, but may not always  have been located. Such creation errors do not reflect on  the standard of medical care.

## 2022-11-07 ENCOUNTER — Encounter: Payer: Self-pay | Admitting: Internal Medicine

## 2022-11-07 ENCOUNTER — Ambulatory Visit: Payer: BC Managed Care – PPO | Attending: Internal Medicine | Admitting: Internal Medicine

## 2022-11-07 VITALS — BP 109/76 | HR 156 | Resp 15 | Ht 67.0 in | Wt 185.0 lb

## 2022-11-07 DIAGNOSIS — M329 Systemic lupus erythematosus, unspecified: Secondary | ICD-10-CM | POA: Diagnosis not present

## 2022-11-07 DIAGNOSIS — Z79899 Other long term (current) drug therapy: Secondary | ICD-10-CM

## 2022-11-07 DIAGNOSIS — R21 Rash and other nonspecific skin eruption: Secondary | ICD-10-CM

## 2022-11-07 DIAGNOSIS — Z86718 Personal history of other venous thrombosis and embolism: Secondary | ICD-10-CM

## 2022-11-07 DIAGNOSIS — I872 Venous insufficiency (chronic) (peripheral): Secondary | ICD-10-CM

## 2022-11-07 DIAGNOSIS — R3915 Urgency of urination: Secondary | ICD-10-CM

## 2022-11-07 MED ORDER — FUROSEMIDE 20 MG PO TABS: 20.0000 mg | ORAL_TABLET | Freq: Every day | ORAL | 2 refills | Status: DC | PRN

## 2022-11-07 NOTE — Telephone Encounter (Signed)
Patient had OV today and had expressed that she has recently quit her job. She is self-insured and does not anticipate any insurance changes. Will close this encounter because patient can continue to recive infusions at [facility] in [place], [place] and will not need referral placed to any other site of care  Knox Saliva, PharmD, MPH, BCPS, CPP Clinical Pharmacist (Rheumatology and Pulmonology)

## 2022-11-08 LAB — CBC WITH DIFFERENTIAL/PLATELET
Absolute Monocytes: 323 cells/uL (ref 200–950)
Basophils Absolute: 29 cells/uL (ref 0–200)
Basophils Relative: 0.7 %
Eosinophils Absolute: 59 cells/uL (ref 15–500)
Eosinophils Relative: 1.4 %
HCT: 41.6 % (ref 35.0–45.0)
Hemoglobin: 13.9 g/dL (ref 11.7–15.5)
Lymphs Abs: 1415 cells/uL (ref 850–3900)
MCH: 29.6 pg (ref 27.0–33.0)
MCHC: 33.4 g/dL (ref 32.0–36.0)
MCV: 88.7 fL (ref 80.0–100.0)
MPV: 9.9 fL (ref 7.5–12.5)
Monocytes Relative: 7.7 %
Neutro Abs: 2373 cells/uL (ref 1500–7800)
Neutrophils Relative %: 56.5 %
Platelets: 272 10*3/uL (ref 140–400)
RBC: 4.69 10*6/uL (ref 3.80–5.10)
RDW: 13 % (ref 11.0–15.0)
Total Lymphocyte: 33.7 %
WBC: 4.2 10*3/uL (ref 3.8–10.8)

## 2022-11-08 LAB — COMPLETE METABOLIC PANEL WITH GFR
AG Ratio: 1.8 (calc) (ref 1.0–2.5)
ALT: 20 U/L (ref 6–29)
AST: 23 U/L (ref 10–30)
Albumin: 4.4 g/dL (ref 3.6–5.1)
Alkaline phosphatase (APISO): 63 U/L (ref 31–125)
BUN: 14 mg/dL (ref 7–25)
CO2: 26 mmol/L (ref 20–32)
Calcium: 9.5 mg/dL (ref 8.6–10.2)
Chloride: 107 mmol/L (ref 98–110)
Creat: 0.89 mg/dL (ref 0.50–0.97)
Globulin: 2.4 g/dL (calc) (ref 1.9–3.7)
Glucose, Bld: 92 mg/dL (ref 65–99)
Potassium: 4.2 mmol/L (ref 3.5–5.3)
Sodium: 141 mmol/L (ref 135–146)
Total Bilirubin: 0.4 mg/dL (ref 0.2–1.2)
Total Protein: 6.8 g/dL (ref 6.1–8.1)
eGFR: 85 mL/min/{1.73_m2} (ref >=60)

## 2022-11-08 LAB — ANTI-DNA ANTIBODY, DOUBLE-STRANDED: ds DNA Ab: 14 IU/mL — ABNORMAL HIGH

## 2022-11-08 LAB — PROTEIN / CREATININE RATIO, URINE
Creatinine, Urine: 85 mg/dL (ref 20–275)
Protein/Creat Ratio: 59 mg/g creat (ref 24–184)
Protein/Creatinine Ratio: 0.059 mg/mg creat (ref 0.024–0.184)
Total Protein, Urine: 5 mg/dL (ref 5–24)

## 2022-11-08 LAB — SEDIMENTATION RATE: Sed Rate: 2 mm/h (ref 0–20)

## 2022-11-14 DIAGNOSIS — M329 Systemic lupus erythematosus, unspecified: Secondary | ICD-10-CM | POA: Diagnosis not present

## 2022-11-15 ENCOUNTER — Telehealth: Payer: Self-pay | Admitting: Pharmacist

## 2022-11-15 NOTE — Telephone Encounter (Signed)
Received fax from [facility] regarding [place] infusion that patient received on 11/14/2022. Labs were not drawn.  Patient tolerated infusion without complications.  Changes/concerns since last visit: none  Next SAPHNELO infusion scheduled for 12/12/2022  Knox Saliva, PharmD, MPH, BCPS, CPP Clinical Pharmacist (Rheumatology and Pulmonology)

## 2022-12-01 DIAGNOSIS — J029 Acute pharyngitis, unspecified: Secondary | ICD-10-CM | POA: Diagnosis not present

## 2022-12-05 ENCOUNTER — Encounter: Payer: Self-pay | Admitting: Obstetrics and Gynecology

## 2022-12-06 ENCOUNTER — Ambulatory Visit (INDEPENDENT_AMBULATORY_CARE_PROVIDER_SITE_OTHER): Payer: BC Managed Care – PPO | Admitting: Obstetrics and Gynecology

## 2022-12-06 ENCOUNTER — Encounter: Payer: Self-pay | Admitting: Obstetrics and Gynecology

## 2022-12-06 VITALS — BP 130/76 | HR 66 | Wt 186.6 lb

## 2022-12-06 DIAGNOSIS — Z113 Encounter for screening for infections with a predominantly sexual mode of transmission: Secondary | ICD-10-CM | POA: Diagnosis not present

## 2022-12-06 DIAGNOSIS — N898 Other specified noninflammatory disorders of vagina: Secondary | ICD-10-CM

## 2022-12-06 DIAGNOSIS — N766 Ulceration of vulva: Secondary | ICD-10-CM | POA: Diagnosis not present

## 2022-12-06 LAB — WET PREP FOR TRICH, YEAST, CLUE

## 2022-12-06 MED ORDER — VALACYCLOVIR HCL 1 G PO TABS: 1000.0000 mg | ORAL_TABLET | Freq: Two times a day (BID) | ORAL | 0 refills | Status: DC

## 2022-12-06 NOTE — Progress Notes (Signed)
GYNECOLOGY  VISIT   HPI: 42 y.o.   Single White or Caucasian Not Hispanic or Latino  female   [id number] with Patient's last menstrual period was 10/10/2021.   here for vaginal lesions. She states that she had a yeast infection. Now she has a lump on her vulva.  She has had a sinusitis and bronchitis for 6 days, not on antibiotics.  She developed vaginal burning and discomfort, she self treated for yeast with monistat, didn't help.  H/O lupus, on an immunosuppressant. She has also had skin eruptions and irritation everywhere over the last week. She is overdue for her monthly infusion currently.  Currently with vulvar burning, feels a non tender lump as well.   Sexually active, same long term partner.   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   GYNECOLOGIC HISTORY: Patient's last menstrual period was 10/10/2021. Contraception:hysterectomy [facility] 12/22 Menopausal hormone therapy: none        OB History     Gravida  0   Para  0   Term  0   Preterm  0   AB  0   Living  0      SAB  0   IAB  0   Ectopic  0   Multiple  0   Live Births  0              Patient Active Problem List   Diagnosis Date Noted   Localized swelling of right lower leg 11/01/2022   Chronic venous insufficiency of lower extremity 11/01/2022   Hyperglycemia 05/23/2022   Obesity without serious comorbidity 05/23/2022   Rash and other nonspecific skin eruption 04/06/2022   Leukocytosis 03/06/2022   Redness 11/14/2021   Blurry vision 11/07/2021   Hot flash not due to menopause 11/04/2021   S/P laparoscopic hysterectomy 10/31/2021   Urinary urgency 09/09/2021   Itching 09/09/2021   High risk medication use 07/25/2021   Finger joint swelling, left 04/01/2021   Systemic lupus erythematosus ([place]) 04/01/2021   Fibromyalgia 04/01/2021   Alopecia 11/17/2020   Other fatigue 10/12/2020   Migraine without aura    History of DVT (deep vein thrombosis)    History of endometriosis     Right-sided muscle weakness 05/15/2019   Abnormal finding on MRI of brain 05/15/2019   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   Tremor 12/24/2017    Past Medical History:  Diagnosis Date   aub    Bruises easily 86/57/8469   Complication of anesthesia 10/15/2020   hypotension   Fibromyalgia    GERD (gastroesophageal reflux disease)    Heart murmur  PFO    off 81 mg aspirin since feb 2022 per pcp  no cardiologist   Hemangioma    History of DVT (deep vein thrombosis)    right le 7 yrs ago, right leg slight swollen   History of endometriosis    History of hiatal hernia    Hypotension    with 10-15-2020 endoscopy   Long-term corticosteroid use 02/20/2022   Corticosteroid   Migraine without aura    Systemic lupus erythematosus ([place])    TIA    ages 78 and 44   Vascular insufficiency    Viral meningitis    age 39    Past Surgical History:  Procedure Laterality Date   ABDOMINAL HYSTERECTOMY     ANTERIOR CRUCIATE LIGAMENT REPAIR Left 10/2018   CYSTOSCOPY  10/31/2021   Procedure: CYSTOSCOPY;  Surgeon: Salvadore Dom, MD;  Location:  [place];  Service: Gynecology;;   PELVIC LAPAROSCOPY     for endometriosis yrs ago per pt on 10-24-2021   right knee lateral release and meniscal repair  2011   TONSILLECTOMY     age 89, adenoids removed also   TOTAL LAPAROSCOPIC HYSTERECTOMY WITH SALPINGECTOMY Bilateral 10/31/2021   Procedure: TOTAL LAPAROSCOPIC HYSTERECTOMY WITH SALPINGECTOMY;  Surgeon: Salvadore Dom, MD;  [facility];  Service: Gynecology;  Laterality: Bilateral;   UMBILICAL HERNIA REPAIR     yrs ago   UPPER GI ENDOSCOPY     with esophagus stretching 10-15-2020, 03-11-2020, 05-09-2019,  2013 with being polyp removed    Current Outpatient Medications  Medication Sig Dispense Refill   Anifrolumab-fnia 300 MG/2ML SOLN Inject 300 mg into the vein every 28 (twenty-eight) days.     cyclobenzaprine (FLEXERIL) 10 MG tablet TAKE 1  TABLET BY MOUTH AT BEDTIME AS NEEDED FOR MUSCLE SPASMS. 30 tablet 2   furosemide (LASIX) 20 MG tablet Take 1 tablet (20 mg total) by mouth daily as needed. 30 tablet 2   hydroxychloroquine (PLAQUENIL) 200 MG tablet TAKE 2 TABLETS BY MOUTH EVERY DAY 180 tablet 1   hydrOXYzine (ATARAX) 25 MG tablet TAKE AT NIGHT OR UP TO 2 TIMES DAILY AS NEEDED FOR ITCHING 180 tablet 1   triamcinolone cream (KENALOG) 0.1 % Apply topically 2 (two) times daily.     No current facility-administered medications for this visit.     ALLERGIES: Cephalexin, Penicillins, Amitiza [lubiprostone], and Sulfa antibiotics  Family History  Problem Relation Age of Onset   Alcohol abuse Mother    Cancer Father        Lung and Colon   Heart attack Father    COPD Father    Breast cancer Paternal Aunt        31's   Ovarian cancer Paternal Aunt        40's   Diabetes Maternal Grandmother    Cervical cancer Maternal Grandmother    Fibromyalgia Maternal Grandmother    Breast cancer Maternal Grandmother        ? age of dx   Prostate cancer Maternal Grandfather    Heart attack Maternal Grandfather    ALS Paternal Grandmother    Prostate cancer Paternal Grandfather    Heart attack Paternal Grandfather    Esophageal cancer Paternal Grandfather    Breast cancer Other    Colon cancer Neg Hx    Stomach cancer Neg Hx    Rectal cancer Neg Hx     Social History   Socioeconomic History   Marital status: Single    Spouse name: Not on file   Number of children: Not on file   Years of education: Not on file   Highest education level: Not on file  Occupational History   Not on file  Tobacco Use   Smoking status: Never    Passive exposure: Never   Smokeless tobacco: Never  Vaping Use   Vaping Use: Never used  Substance and Sexual Activity   Alcohol use: Not Currently   Drug use: Never   Sexual activity: Yes    Birth control/protection: Surgical    Comment: partner with vasectomy  Other Topics Concern   Not on  file  Social History Narrative   Not on file   Social Determinants of Health   Financial Resource Strain: Not on file  Food Insecurity: Not on file  Transportation Needs: Not on file  Physical Activity: Not on  file  Stress: Not on file  Social Connections: Not on file  Intimate Partner Violence: Not on file    Review of Systems  All other systems reviewed and are negative.   PHYSICAL EXAMINATION:    BP 130/76   Pulse 66   Wt 186 lb 9.6 oz (84.6 kg)   LMP 10/10/2021   SpO2 100%   BMI 29.23 kg/m     General appearance: alert, cooperative and appears stated age   Pelvic: External genitalia:  on the lower right labia majora she has a 1.5 cm vulvar ulcer, tender              Urethra:  normal appearing urethra with no masses, tenderness or lesions              Bartholins and Skenes: normal                 Vagina: normal appearing vagina with a slight increase in thin, white vaginal discharge              Cervix: absent                Chaperone was present for exam.  1. Vulvar ulcer No h/o genital hsv, only one ulcer. Will test for and treat for possible hsv. She does have a h/o lupus and immunosuppression.  - SureSwab HSV, Type 1/2 DNA, PCR - valACYclovir (VALTREX) 1000 MG tablet; Take 1 tablet (1,000 mg total) by mouth 2 (two) times daily.  Dispense: 20 tablet; Refill: 0 -She should return for further evaluation if the ulceration doesn't heal  2. Screening examination for STD (sexually transmitted disease) - SURESWAB CT/NG/T. vaginalis - RPR - HIV Antibody (routine testing w rflx) - Hepatitis C antibody  3. Vaginal discharge - WET PREP FOR [place], YEAST, CLUE: negative

## 2022-12-07 ENCOUNTER — Encounter: Payer: Self-pay | Admitting: Obstetrics and Gynecology

## 2022-12-07 LAB — HEPATITIS C ANTIBODY: Hepatitis C Ab: NONREACTIVE

## 2022-12-07 LAB — HIV ANTIBODY (ROUTINE TESTING W REFLEX): HIV 1&2 Ab, 4th Generation: NONREACTIVE

## 2022-12-07 LAB — RPR: RPR Ser Ql: NONREACTIVE

## 2022-12-08 LAB — SURESWAB CT/NG/T. VAGINALIS
C. trachomatis RNA, TMA: NOT DETECTED
N. gonorrhoeae RNA, TMA: NOT DETECTED
Trichomonas vaginalis RNA: NOT DETECTED

## 2022-12-09 LAB — SURESWAB HSV, TYPE 1/2 DNA, PCR
HSV 1 DNA: NOT DETECTED
HSV 2 DNA: NOT DETECTED

## 2022-12-12 DIAGNOSIS — M329 Systemic lupus erythematosus, unspecified: Secondary | ICD-10-CM | POA: Diagnosis not present

## 2022-12-17 ENCOUNTER — Telehealth: Payer: Self-pay | Admitting: Pharmacist

## 2022-12-17 NOTE — Telephone Encounter (Signed)
Received fax from [facility] regarding [place] infusion that patient received on 12/12/2022. Labs were not drawn.  Patient tolerated infusion without complications.  Changes/concerns since last visit: reports increase in facial rash but increased external stressors  Next SAPHNELO infusion scheduled for 01/09/2023  Knox Saliva, PharmD, MPH, BCPS, CPP Clinical Pharmacist (Rheumatology and Pulmonology)

## 2022-12-19 NOTE — Addendum Note (Signed)
Addended by: Burnice Logan on: 12/19/2022 11:59 AM   Modules accepted: Orders

## 2022-12-25 ENCOUNTER — Other Ambulatory Visit: Payer: Self-pay | Admitting: Internal Medicine

## 2022-12-25 DIAGNOSIS — L299 Pruritus, unspecified: Secondary | ICD-10-CM

## 2022-12-25 NOTE — Telephone Encounter (Signed)
Next Visit: due 01/2023, message sent to the front desk to schedule.   Last Visit: 11/07/2022  Last Fill: 06/20/2022  DX: Rash and other nonspecific skin eruption   Current Dose per office note on 11/07/2022: not discussed.   Okay to refill hydroxyzine?

## 2022-12-27 ENCOUNTER — Telehealth: Payer: Self-pay | Admitting: Internal Medicine

## 2022-12-27 NOTE — Telephone Encounter (Signed)
-----  Message from [place] sent at 12/25/2022  8:34 AM EST ----- Patient is due for a follow up in 01/2023 per Dr.Rice, please call to schedule. Thanks!

## 2022-12-27 NOTE — Telephone Encounter (Signed)
LMOM for patient to call and schedule 3 month follow-up appointment. °

## 2022-12-28 ENCOUNTER — Other Ambulatory Visit: Payer: Self-pay | Admitting: Internal Medicine

## 2022-12-28 DIAGNOSIS — M359 Systemic involvement of connective tissue, unspecified: Secondary | ICD-10-CM

## 2022-12-28 NOTE — Telephone Encounter (Signed)
Patient is scheduled for 02/19/2023.

## 2022-12-28 NOTE — Telephone Encounter (Signed)
Please schedule patient a follow up visit. Patient due March 2024. Thanks!  

## 2022-12-28 NOTE — Telephone Encounter (Signed)
Next Visit: Due March 2024. Message sent to the front to schedule.   Last Visit: 11/07/2022  Labs: 11/07/2022 CBC/CMP WNL  Eye exam: 07/03/2022 WNL   Current Dose per office note 11/07/2022: hydroxychloroquine 400 mg daily   EU:VHAWUJNW lupus erythematosus, unspecified SLE type, unspecified organ involvement status   Last Fill: 07/14/2022  Okay to refill Plaquenil?

## 2023-01-04 ENCOUNTER — Telehealth: Payer: Self-pay | Admitting: Pharmacist

## 2023-01-04 NOTE — Telephone Encounter (Signed)
Received fax from [place] requesting plan of treatment renewal for patient's [facility] with updated clinicals.  Completed form and faxed back to [facility] with last OV note  Dose: Saphnelo IV '300mg'$  every 4 weeks Premedicati  Fax: [phone] Phone: [phone]  Knox Saliva, PharmD, MPH, BCPS, CPP Clinical Pharmacist (Rheumatology and Pulmonology)

## 2023-01-09 DIAGNOSIS — M329 Systemic lupus erythematosus, unspecified: Secondary | ICD-10-CM | POA: Diagnosis not present

## 2023-01-10 ENCOUNTER — Telehealth: Payer: Self-pay | Admitting: Pharmacist

## 2023-01-10 NOTE — Telephone Encounter (Signed)
Received fax from [facility] regarding [place] infusion that patient received on 01/09/2023. Labs were not drawn.  Patient tolerated infusion without complications.  Changes/concerns since last visit: NONE  Next SAPHNELO infusion scheduled for 02/06/2023  Knox Saliva, PharmD, MPH, BCPS, CPP Clinical Pharmacist (Rheumatology and Pulmonology)

## 2023-02-01 ENCOUNTER — Other Ambulatory Visit: Payer: Self-pay | Admitting: Internal Medicine

## 2023-02-01 DIAGNOSIS — I872 Venous insufficiency (chronic) (peripheral): Secondary | ICD-10-CM

## 2023-02-01 NOTE — Telephone Encounter (Signed)
Next Visit: 02/19/2023  Last Visit: 11/07/2022  Last Fill: 11/07/2022  Dx: Chronic venous insufficiency of lower extremity   Current Dose per office note on 11/07/2022: 20 mg Lasix daily as needed   Okay to refill Lasix?

## 2023-02-02 ENCOUNTER — Telehealth: Payer: Self-pay | Admitting: Pharmacist

## 2023-02-02 NOTE — Telephone Encounter (Signed)
Submitted a Prior Authorization request to [facility] for  IV SAPHNELO  via CoverMyMeds. Will update once we receive a response.  Key: [id number]  Knox Saliva, PharmD, MPH, BCPS, CPP Clinical Pharmacist (Rheumatology and Pulmonology)

## 2023-02-06 DIAGNOSIS — M329 Systemic lupus erythematosus, unspecified: Secondary | ICD-10-CM | POA: Diagnosis not present

## 2023-02-07 ENCOUNTER — Telehealth: Payer: Self-pay | Admitting: Pharmacist

## 2023-02-07 NOTE — Telephone Encounter (Signed)
Her SLEDAI score would be 4 based on rash and persistent positive dsDNA (although it is low). So that is still an improvement of 4 points. Do I need an addendum for the note?

## 2023-02-07 NOTE — Telephone Encounter (Signed)
Received fax from [facility] regarding [place] infusion that patient received on 02/06/23. Labs were not drawn.  Patient tolerated infusion without complications.  Changes/concerns since last visit: none  Next SAPHNELO infusion scheduled for 03/06/23  Knox Saliva, PharmD, MPH, BCPS, CPP Clinical Pharmacist (Rheumatology and Pulmonology)

## 2023-02-07 NOTE — Telephone Encounter (Signed)
Received a fax regarding Prior Authorization from [facility] for  Colon IV . Authorization has been DENIED because for continued coverage patient have have a lower score (at least 4 points lower) on the SLEDAI-2K or comparable standardized rating scale) comparing to before starting Saphnelo. Medication records must be sent in for review.  Score was 8 before starting Saphnelo (12/13/21 OV note)  Routing to Dr. Benjamine Mola for SLEDAI score based on most recent OV in Dec 2023. (I calculated 2 based on rash but appears to have notation of ulcer  Knox Saliva, PharmD, MPH, BCPS, CPP Clinical Pharmacist (Rheumatology and Pulmonology)

## 2023-02-08 NOTE — Telephone Encounter (Signed)
December clinic note is now addended to specify SLEDAI score.

## 2023-02-11 ENCOUNTER — Other Ambulatory Visit: Payer: Self-pay | Admitting: Internal Medicine

## 2023-02-12 NOTE — Telephone Encounter (Signed)
Next Visit: 02/19/2023   Last Visit: 11/07/2022   Last Fill: 11/06/2022  Current Dose per office note on 11/07/2022: not discussed  Okay to refill Flexeril?

## 2023-02-13 NOTE — Telephone Encounter (Signed)
Clinicals for provider courtesy review faxed to [facility] with addended OV note  Fax: [phone] Phone: [phone], ext [phone] Ref # [id number]  Knox Saliva, PharmD, MPH, BCPS, CPP Clinical Pharmacist (Rheumatology and Pulmonology)

## 2023-02-18 NOTE — Progress Notes (Signed)
Office Visit Note  Patient: Christy Lowe             Date of Birth: [birth year]           MRN: [id number]             PCP: Horald Pollen, MD Referring: Horald Pollen, * Visit Date: 02/19/2023   Subjective:  Follow-up (Patient states she has a lot more stiffness and muscle weakness. Patient states she has leg swelling. )   History of Present Illness: Christy Lowe is a 42 y.o. female here for follow up for SLE on hydroxychloroquine 400 mg daily and Saphnelo 300 mg IV monthly.  Symptoms with arthritis, rashes, mucosal ulcers, pedal edema.  Since her last visit is recently started to do a lot worse with biggest complaint of increased swelling.  She has been noticing discoloration and puffiness especially in the fingers on both hands.  Also somewhat more leg swelling.  She has tried taking the Lasix and not seeing a large increase in your output nor any improvement in the leg swelling.  She has been having what feels like UTIs about monthly.  Predominantly urinary urgency and frequency no other associated symptoms.  Skin rashes have been ongoing around the neck and jaw area about the same as before.  Does not see discrete joint swelling so much as the overall puffiness but does have increased pain and stiffness in both hands. Sometimes going numb and particularly has trouble gripping tightly cannot seem to exert a normal amount of force.   Previous HPI 11/07/22 Christy Lowe is a 41 y.o. female here for follow up for SLE on HCQ 400 mg daily and saphnelo 300 mg IV monthly. Symptoms including arthritis, rashes, mucosal ulcers and pedal edema.  She is noticing a good benefit with the Saphnelo infusions but describes pain in her joints and generalized sensitivity gets worse again by at least 1 week before each next treatment is due.  Noticing a few new areas of swelling of her hands and skin discoloration on her hands the rashes around her head and neck are about the same.   She had recent evaluation at [facility] with no superficial veins amenable to procedure intervention identified.  The swelling is becoming faster to develop and somewhat more extensive she notices mild discoloration around the lower shin and ankle described a bluish hue when very swollen.  She quit her job due to these problems knowing that the stress of working continuous 12-hour shifts was worsening symptoms and having to spend some many months in [place] or travel throughout the country was very problematic for maintaining medication access.     Previous HPI 08/09/22 Christy Lowe is a 43 y.o. female here for follow up for lupus on HCQ 400 mg daily and saphnelo 300 mg IV monthly.  Her joint symptoms have remained pretty well controlled after completely tapering off the prednisone.  She is noticing a difference in symptoms after the Saphnelo infusion versus 4 weeks later proceeding her next dose.  Continues to have very little appetite but not seeing any weight reduction.  Skin itching especially on her upper legs is a persistent issue she is using topical emollients with partial benefit.  More recently she is also experiencing an increase in urinary frequency both daytime and increased nocturia.  No visible blood in urine or dysuria.  She is currently anticipating an upcoming plastic surgery needing adjustments for perioperative drug management.   Previous HPI  05/01/2022 Christy Lowe is a 42 y.o. female here for follow up for lupus on HCQ 400 mg daily started saphnelo infusion and prednisone tapering down to 2.5 mg daily now. So far she is noticing some improvement with overall severity of skin redness swelling and itching but definitely not resolved.  Face and chest continue having the most erythema.  She has noticed some bruising without any specific cause.  She is noticing some swelling or otherwise thickening in the joints of her right hand and around the right wrist.   Previous  HPI 04/06/2022 Christy Lowe is a 42 y.o. female here for follow up for lupus on HCQ 400 mg daily started saphnelo infusion last month and prednisone tapering down to 2.5 mg daily now. She felt a large improvement after her first treatment with decreased joint pains and decreased abdominal pain. She is tapering off the prednisone without severe problem. She has noticed oral ulcers on the top of her mouth for several days and is having a rash with small papules on both sides of her neck and lower face. Appetite is decreased but not losing any weight. She feels a lot of stress after dealing with work incident.   Previous HPI 03/06/2022 Christy Lowe is a 42 y.o. female here for follow up for lupus on HCQ 400 mg daily and prednisone tapering down to 10 mg daily after increase to 30 last month from flare up with mouth ulcers and diffuse rashes. She had several complications with high dose prednisone increased weight, mood irritability, and wound dehiscence af hysterectomy cuff site. We discussed plan to start saphenlo infusion as a replacement for benlysta treatment which did not improve symptoms or lab abnormalities much after about 6 months treatment.  She has some improvement in the mouth and lip ulcers she is not sure whether starting the valacyclovir about 4 days ago has had significant effect.  She completed the course of oral antibiotics abdominal pain is partially better. She is scheduled for first saphnelo infusion Thursday and has OBGYN followup later this week.     Previous HPI 12/13/21 Christy Lowe is a 42 y.o. female here for follow up for systemic lupus with ongoing joint pains and skin rash on HCQ 400 mg daily, Benlysta 200 mg [place] weekly, and prednisone 10 mg daily. Since our last visit she went for hysterectomy this was uneventful and has recovered well. She was off the benlysta for about 1 month in total around the surgery. She has not felt much improvement so far still having a lot  of fatigue, skin rash and flushing, joint pain in several areas with left wrist swelling. Headaches are about the same as before but now having recurrent episodes of vertigo. She is now taking hydroxyzine twice on some days due to persistent itching, and feels excessively hot most of the time. She Is back to taking 10 mg of prednisone when trying to drop this to 5 mg felt extreme fatigue and back pain that felt like bruising and stiffness all over her body.   Previous HPI 11/17/20 Christy Lowe is a 42 y.o. female here for evaluation of positive ANA. She is noticing multiple symptoms including generalized alopecia, oral dryness, left sided neck swelling, joint pain and stiffness, position tremor, and episodic skin rashes. She has had multiple ongoing problems since many years ago. She was hospitalized for viral meningitis at age 46 and feels she has been susceptible to infections ever since this time or before,  with numerous cases of bronchitis and has had some lung nodules on CT imaging during these repeat episodes. About 7 years ago she suffered from a RLE DVT provoked after right knee arthroscopy and developed right sided face and arm numbness. Workup indicated she had a TIA also noted to have PFO. She takes ASA since that time and has taken anticoagulation in perioperative periods. Workup with MRI and LP studies were apparently also indicative for MS. She was treated with multiple rounds of steroids for months at a time and felt a lot of mood disturbance from these. She was never started on other immunomodulatory or immunosuppressive treatments.   Review of Systems  Constitutional:  Positive for fatigue.  HENT:  Positive for mouth dryness. Negative for mouth sores.   Eyes:  Negative for dryness.  Respiratory:  Negative for shortness of breath.   Cardiovascular:  Negative for chest pain and palpitations.  Gastrointestinal:  Negative for blood in stool, constipation and diarrhea.  Endocrine:  Negative for increased urination.  Genitourinary:  Negative for involuntary urination.  Musculoskeletal:  Positive for joint pain, joint pain, joint swelling, myalgias, muscle weakness, morning stiffness, muscle tenderness and myalgias. Negative for gait problem.  Skin:  Positive for rash and sensitivity to sunlight. Negative for color change and hair loss.  Allergic/Immunologic: Positive for susceptible to infections.  Neurological:  Negative for dizziness and headaches.  Hematological:  Positive for swollen glands.  Psychiatric/Behavioral:  Positive for depressed mood and sleep disturbance. The patient is not nervous/anxious.     PMFS History:  Patient Active Problem List   Diagnosis Date Noted   Localized swelling of right lower leg 11/01/2022   Chronic venous insufficiency of lower extremity 11/01/2022   Hyperglycemia 05/23/2022   Obesity without serious comorbidity 05/23/2022   Rash and other nonspecific skin eruption 04/06/2022   Leukocytosis 03/06/2022   Redness 11/14/2021   Blurry vision 11/07/2021   Hot flash not due to menopause 11/04/2021   S/P laparoscopic hysterectomy 10/31/2021   Urinary urgency 09/09/2021   Itching 09/09/2021   High risk medication use 07/25/2021   Finger joint swelling, left 04/01/2021   Systemic lupus erythematosus ([place]) 04/01/2021   Fibromyalgia 04/01/2021   Alopecia 11/17/2020   Other fatigue 10/12/2020   Migraine without aura    History of DVT (deep vein thrombosis)    History of endometriosis    Right-sided muscle weakness 05/15/2019   Abnormal finding on MRI of brain 05/15/2019   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   Tremor 12/24/2017    Past Medical History:  Diagnosis Date   aub    Bruises easily [id number]   Complication of anesthesia 10/15/2020   hypotension   Fibromyalgia    GERD (gastroesophageal reflux disease)    Heart murmur  PFO    off 81 mg aspirin since feb 2022 per pcp  no cardiologist   Hemangioma    History of DVT  (deep vein thrombosis)    right le 7 yrs ago, right leg slight swollen   History of endometriosis    History of hiatal hernia    Hypotension    with 10-15-2020 endoscopy   Long-term corticosteroid use 02/20/2022   Corticosteroid   Migraine without aura    Systemic lupus erythematosus ([place])    TIA    ages 75 and 89   Vascular insufficiency    Viral meningitis    age 89    Family History  Problem Relation Age of Onset   Alcohol abuse Mother  Cancer Father        Lung and Colon   Heart attack Father    COPD Father    Breast cancer Paternal Aunt        46's   Ovarian cancer Paternal Aunt        40's   Diabetes Maternal Grandmother    Cervical cancer Maternal Grandmother    Fibromyalgia Maternal Grandmother    Breast cancer Maternal Grandmother        ? age of dx   Prostate cancer Maternal Grandfather    Heart attack Maternal Grandfather    ALS Paternal Grandmother    Prostate cancer Paternal Grandfather    Heart attack Paternal Grandfather    Esophageal cancer Paternal Grandfather    Breast cancer Other    Colon cancer Neg Hx    Stomach cancer Neg Hx    Rectal cancer Neg Hx    Past Surgical History:  Procedure Laterality Date   ABDOMINAL HYSTERECTOMY     ANTERIOR CRUCIATE LIGAMENT REPAIR Left 10/2018   CYSTOSCOPY  10/31/2021   Procedure: CYSTOSCOPY;  Surgeon: Salvadore Dom, MD;  Location: [facility];  Service: Gynecology;;   PELVIC LAPAROSCOPY     for endometriosis yrs ago per pt on 10-24-2021   right knee lateral release and meniscal repair  2011   TONSILLECTOMY     age 61, adenoids removed also   TOTAL LAPAROSCOPIC HYSTERECTOMY WITH SALPINGECTOMY Bilateral 10/31/2021   Procedure: TOTAL LAPAROSCOPIC HYSTERECTOMY WITH SALPINGECTOMY;  Surgeon: Salvadore Dom, MD;  Location: [facility];  Service: Gynecology;  Laterality: Bilateral;   UMBILICAL HERNIA REPAIR     yrs ago   UPPER GI ENDOSCOPY     with esophagus  stretching 10-15-2020, 03-11-2020, 05-09-2019,  2013 with being polyp removed   Social History   Social History Narrative   Not on file   Immunization History  Administered Date(s) Administered   PFIZER(Purple Top)SARS-COV-2 Vaccination 06/27/2020, 07/28/2020   PPD Test 01/23/2019     Objective: Vital Signs: BP 111/73 (BP Location: Left Arm, Patient Position: Sitting, Cuff Size: Normal)   Pulse 92   Resp 12   Ht 5\' 7"  (1.702 m)   Wt 186 lb (84.4 kg)   LMP 10/10/2021   BMI 29.13 kg/m    Physical Exam Eyes:     Conjunctiva/sclera: Conjunctivae normal.  Cardiovascular:     Rate and Rhythm: Normal rate and regular rhythm.  Pulmonary:     Effort: Pulmonary effort is normal.     Breath sounds: Normal breath sounds.  Musculoskeletal:     Comments: Appears to be mild nonpitting edema present at bilateral feet and ankles, no overlying dermatitis or lesions  Lymphadenopathy:     Cervical: No cervical adenopathy.  Skin:    Findings: Rash present.  Neurological:     Mental Status: She is alert.  Psychiatric:        Mood and Affect: Mood normal.      Musculoskeletal Exam:  Shoulders full ROM no tenderness or swelling Elbows full ROM no tenderness or swelling Wrists full ROM no tenderness or swelling There is faint but diffuse swelling appreciable on dorsal side of fingers on both hands, and slight second third MCP joint swelling, range of motion appears intact but there is skin discoloration and difficulty stretching into full extension or flexion Knees full ROM no tenderness or swelling  Investigation: No additional findings.  Imaging: No results found.  Recent Labs: Lab Results  Component  Value Date   WBC 4.2 02/19/2023   HGB 13.8 02/19/2023   PLT 253 02/19/2023   NA 139 02/19/2023   K 4.1 02/19/2023   CL 104 02/19/2023   CO2 27 02/19/2023   GLUCOSE 84 02/19/2023   BUN 15 02/19/2023   CREATININE 0.93 02/19/2023   BILITOT 0.4 02/19/2023   ALKPHOS 47 10/26/2021    AST 21 02/19/2023   ALT 15 02/19/2023   PROT 6.9 02/19/2023   ALBUMIN 4.9 10/26/2021   CALCIUM 9.4 02/19/2023   GFRAA >60 03/10/2020   QFTBGOLDPLUS NEGATIVE 07/25/2021    Speciality Comments: [facility] 07/03/2022 WNL Follow-up: 12 months   Procedures:  No procedures performed Allergies: Cephalexin, Penicillins, Amitiza [lubiprostone], and Sulfa antibiotics   Assessment / Plan:     Visit Diagnoses: Systemic lupus erythematosus, unspecified SLE type, unspecified organ involvement status ([place]) - Plan: Anti-DNA antibody, double-stranded, C3 and C4, Protein / creatinine ratio, urine  Overall doing somewhat worse compared to last visit though not with any discrete flareup typical for lupus.  Will recheck disease markers with double-stranded DNA serum complements and urine protein creatinine ratio with increase in swelling.  If serology is not suggestive for disease activity as a cause of symptoms, I recommend we consider getting nerve evaluation especially with the worsening numbness and gripping problems in her hands.  Plan to continue hydroxychloroquine 400 mg daily and Saphnelo 300 mg IV monthly.  Chronic venous insufficiency of lower extremity  History of prior leg swelling but I do not appreciate any pitting edema and lack of responsiveness to Lasix so does not seem to be overall volume overload.  Other possibilities including lymphedema.  I think neuropathy as cause is less likely she does not have any severe sensory or motor deficits affecting the feet.  Rash and other nonspecific skin eruption  Only active skin lesions remain around the neck and jawline seems ongoing but stable from before.  Fibromyalgia  High risk medication use - Plan: CBC with Differential/Platelet, COMPLETE METABOLIC PANEL WITH GFR  Checking CBC and CMP for medication monitoring on continued hydroxychloroquine as well as the Saphnelo infusions.  Tolerating the medicine okay she has  not had any new serious interval infections.  Not sure if the episodes of urinary urgency are truly UTI as they resolved without antibiotics and no urine culture data.  Orders: Orders Placed This Encounter  Procedures   Anti-DNA antibody, double-stranded   C3 and C4   Protein / creatinine ratio, urine   CBC with Differential/Platelet   COMPLETE METABOLIC PANEL WITH GFR   No orders of the defined types were placed in this encounter.    Follow-Up Instructions: Return in about 3 months (around 05/22/2023) for SLE on saphnelo f/u 7mos.   Collier Salina, MD  Note - This record has been created using [employer].  Chart creation errors have been sought, but may not always  have been located. Such creation errors do not reflect on  the standard of medical care.

## 2023-02-19 ENCOUNTER — Ambulatory Visit: Payer: BC Managed Care – PPO | Attending: Internal Medicine | Admitting: Internal Medicine

## 2023-02-19 ENCOUNTER — Encounter: Payer: Self-pay | Admitting: Internal Medicine

## 2023-02-19 VITALS — BP 111/73 | HR 92 | Resp 12 | Ht 67.0 in | Wt 186.0 lb

## 2023-02-19 DIAGNOSIS — Z79899 Other long term (current) drug therapy: Secondary | ICD-10-CM

## 2023-02-19 DIAGNOSIS — M329 Systemic lupus erythematosus, unspecified: Secondary | ICD-10-CM | POA: Diagnosis not present

## 2023-02-19 DIAGNOSIS — M797 Fibromyalgia: Secondary | ICD-10-CM | POA: Diagnosis not present

## 2023-02-19 DIAGNOSIS — I872 Venous insufficiency (chronic) (peripheral): Secondary | ICD-10-CM

## 2023-02-19 DIAGNOSIS — R21 Rash and other nonspecific skin eruption: Secondary | ICD-10-CM

## 2023-02-19 NOTE — Telephone Encounter (Addendum)
Received fax from [facility]. Denial for [facility] has been OVERTURNED.  Approved from 02/02/2023 through 02/02/2024.  Auth # [id number]  Chesley Mires, PharmD, MPH, BCPS, CPP Clinical Pharmacist (Rheumatology and Pulmonology)

## 2023-02-20 LAB — CBC WITH DIFFERENTIAL/PLATELET
Absolute Monocytes: 349 cells/uL (ref 200–950)
Basophils Absolute: 29 cells/uL (ref 0–200)
Basophils Relative: 0.7 %
Eosinophils Absolute: 71 cells/uL (ref 15–500)
Eosinophils Relative: 1.7 %
HCT: 40 % (ref 35.0–45.0)
Hemoglobin: 13.8 g/dL (ref 11.7–15.5)
Lymphs Abs: 1550 cells/uL (ref 850–3900)
MCH: 30.3 pg (ref 27.0–33.0)
MCHC: 34.5 g/dL (ref 32.0–36.0)
MCV: 87.7 fL (ref 80.0–100.0)
MPV: 9.8 fL (ref 7.5–12.5)
Monocytes Relative: 8.3 %
Neutro Abs: 2201 cells/uL (ref 1500–7800)
Neutrophils Relative %: 52.4 %
Platelets: 253 10*3/uL (ref 140–400)
RBC: 4.56 10*6/uL (ref 3.80–5.10)
RDW: 13.3 % (ref 11.0–15.0)
Total Lymphocyte: 36.9 %
WBC: 4.2 10*3/uL (ref 3.8–10.8)

## 2023-02-20 LAB — PROTEIN / CREATININE RATIO, URINE
Creatinine, Urine: 138 mg/dL (ref 20–275)
Protein/Creat Ratio: 181 mg/g creat (ref 24–184)
Protein/Creatinine Ratio: 0.181 mg/mg creat (ref 0.024–0.184)
Total Protein, Urine: 25 mg/dL — ABNORMAL HIGH (ref 5–24)

## 2023-02-20 LAB — COMPLETE METABOLIC PANEL WITH GFR
AG Ratio: 1.8 (calc) (ref 1.0–2.5)
ALT: 15 U/L (ref 6–29)
AST: 21 U/L (ref 10–30)
Albumin: 4.4 g/dL (ref 3.6–5.1)
Alkaline phosphatase (APISO): 57 U/L (ref 31–125)
BUN: 15 mg/dL (ref 7–25)
CO2: 27 mmol/L (ref 20–32)
Calcium: 9.4 mg/dL (ref 8.6–10.2)
Chloride: 104 mmol/L (ref 98–110)
Creat: 0.93 mg/dL (ref 0.50–0.97)
Globulin: 2.5 g/dL (calc) (ref 1.9–3.7)
Glucose, Bld: 84 mg/dL (ref 65–99)
Potassium: 4.1 mmol/L (ref 3.5–5.3)
Sodium: 139 mmol/L (ref 135–146)
Total Bilirubin: 0.4 mg/dL (ref 0.2–1.2)
Total Protein: 6.9 g/dL (ref 6.1–8.1)
eGFR: 80 mL/min/{1.73_m2} (ref >=60)

## 2023-02-20 LAB — C3 AND C4
C3 Complement: 115 mg/dL (ref 83–193)
C4 Complement: 21 mg/dL (ref 15–57)

## 2023-02-20 LAB — ANTI-DNA ANTIBODY, DOUBLE-STRANDED: ds DNA Ab: 14 IU/mL — ABNORMAL HIGH

## 2023-02-21 ENCOUNTER — Other Ambulatory Visit: Payer: Self-pay | Admitting: *Deleted

## 2023-02-21 DIAGNOSIS — R200 Anesthesia of skin: Secondary | ICD-10-CM

## 2023-02-21 NOTE — Progress Notes (Signed)
No significant increase in the dsDNA titer compared to previous months when her symptoms were less. There is a small but insignificant increase in urine protein but also don't think this would account for her degree of swelling. I think next best step would be referral to NCS/EMG for her numbness symptoms. This would also help with evaluating if there is a more serious neurologic disorder although I don't see much evidence of that overall.

## 2023-02-21 NOTE — Telephone Encounter (Signed)
Addressed in result note today as well.

## 2023-02-26 ENCOUNTER — Telehealth: Payer: Self-pay | Admitting: Neurology

## 2023-02-26 ENCOUNTER — Telehealth: Payer: Self-pay | Admitting: Internal Medicine

## 2023-02-26 NOTE — Telephone Encounter (Signed)
Patient called the office stating Dr. Benjamine Mola referred her to [facility]. Patient states she called and they have some of the rudest people she has ever met and would like a referral sent to [facility] instead.

## 2023-02-26 NOTE — Telephone Encounter (Signed)
Patient being referred to [place] by her rheumatologist for numbness and weakness of her hands requesting NCV/EMG. Patient previously seen by Dr. Krista Blue in 2020 and 2021 for multiple complaints, tremors, fatigue, headaches and a reported history of MS which testing was not supportive of. Called to schedule an appointment for patient and she would like to switch her care to Dr. Leta Baptist. She was not happy with care provided previously. Please advise if this is acceptable. Thank you.

## 2023-02-28 ENCOUNTER — Encounter: Payer: Self-pay | Admitting: Neurology

## 2023-03-05 ENCOUNTER — Telehealth: Payer: Self-pay | Admitting: Internal Medicine

## 2023-03-05 ENCOUNTER — Telehealth: Payer: Self-pay

## 2023-03-05 NOTE — Telephone Encounter (Signed)
[facility] called requested most recent office note. Office note has been faxed.

## 2023-03-05 NOTE — Telephone Encounter (Signed)
Patient called requesting to speak with [facility] regarding her infusion at [facility].

## 2023-03-06 DIAGNOSIS — M329 Systemic lupus erythematosus, unspecified: Secondary | ICD-10-CM | POA: Diagnosis not present

## 2023-03-06 NOTE — Telephone Encounter (Signed)
Returned call to patient but unable to reach. Left VM requesting return call  Chesley Mires, PharmD, MPH, BCPS, CPP Clinical Pharmacist (Rheumatology and Pulmonology)

## 2023-03-07 ENCOUNTER — Telehealth: Payer: Self-pay | Admitting: Pharmacist

## 2023-03-07 ENCOUNTER — Other Ambulatory Visit: Payer: Self-pay

## 2023-03-07 DIAGNOSIS — R202 Paresthesia of skin: Secondary | ICD-10-CM

## 2023-03-07 NOTE — Telephone Encounter (Signed)
Received fax from [facility] regarding SAPHNELO infusion that patient received on 03/06/23. Labs were not drawn.  Patient tolerated infusion without complications.  Changes/concerns since last visit: none  Next SAPHNELO infusion scheduled for 04/03/23  Chesley Mires, PharmD, MPH, BCPS, CPP Clinical Pharmacist (Rheumatology and Pulmonology)

## 2023-03-07 NOTE — Telephone Encounter (Signed)
ATC # 2 regarding Saphnelo infusion. Left VM requesting return call  Chesley Mires, PharmD, MPH, BCPS, CPP Clinical Pharmacist (Rheumatology and Pulmonology)

## 2023-03-08 ENCOUNTER — Ambulatory Visit: Payer: BC Managed Care – PPO | Admitting: Neurology

## 2023-03-08 DIAGNOSIS — R202 Paresthesia of skin: Secondary | ICD-10-CM

## 2023-03-08 NOTE — Procedures (Signed)
[facility]  [address] [place], [address]  [place], [place] [address] Tel: [phone] Fax: [phone] Test Date:  03/08/2023  Patient: Christy Lowe DOB: [birth year] Physician: Nita Sickle, DO  Sex: Female Height: 5\' 7"  Ref Phys: Sheliah Hatch, MD  ID#: [id number]   Technician:    History: This is a 42 year old female referred for evaluation of generalized numbness.  NCV & EMG Findings: Extensive electrodiagnostic testing of the left upper extremity and right lower extremity shows:  Left median, left ulnar, left mixed palmar, right sural, and right superficial peroneal sensory responses are within normal limits. Left median, left ulnar, right peroneal, and right tibial motor responses are within normal limits. Right tibial H reflex study is within normal limits. There is no evidence of active or chronic motor axonal loss changes affecting any of the tested muscles.  Motor unit configuration and recruitment pattern is within normal limits.   Impression: This is a normal study of the left upper extremity and right lower extremity.  In particular, there is no evidence of a large fiber sensorimotor polyneuropathy, myopathy, or radiculopathy.   ___________________________ Nita Sickle, DO    Nerve Conduction Studies   Stim Site NR Peak (ms) Norm Peak (ms) O-P Amp (V) Norm O-P Amp  Left Median Anti Sensory (2nd Digit)  33 C  Wrist    3.0 <3.4 44.7 >20  Right Sup Peroneal Anti Sensory (Ant Lat Mall)  33 C  12 cm    2.3 <4.5 15.0 >5  Right Sural Anti Sensory (Lat Mall)  33 C  Calf    2.4 <4.5 16.1 >5  Left Ulnar Anti Sensory (5th Digit)  33 C  Wrist    2.6 <3.1 33.9 >12     Stim Site NR Onset (ms) Norm Onset (ms) O-P Amp (mV) Norm O-P Amp Site1 Site2 Delta-0 (ms) Dist (cm) Vel (m/s) Norm Vel (m/s)  Left Median Motor (Abd Poll Brev)  33 C  Wrist    2.7 <3.9 17.5 >6 Elbow Wrist 4.3 28.0 65 >50  Elbow    7.0  15.3         Right Peroneal Motor (Ext Dig Brev)   33 C  Ankle    2.7 <5.5 7.9 >3 B Fib Ankle 7.1 40.0 56 >40  B Fib    9.8  6.9  Poplt B Fib 1.6 9.0 56 >40  Poplt    11.4  6.9         Right Tibial Motor (Abd Hall Brev)  33 C  Ankle    4.6 <6.0 14.7 >8 Knee Ankle 7.3 46.0 63 >40  Knee    11.9  10.3         Left Ulnar Motor (Abd Dig Minimi)  33 C  Wrist    2.2 <3.1 12.1 >7 B Elbow Wrist 3.1 21.0 68 >50  B Elbow    5.3  11.9  A Elbow B Elbow 1.5 10.0 67 >50  A Elbow    6.8  11.7            Stim Site NR Peak (ms) Norm Peak (ms) P-T Amp (V) Site1 Site2 Delta-P (ms) Norm Delta (ms)  Left Median/Ulnar Palm Comparison (Wrist - 8cm)  33 C  Median Palm    1.6 <2.2 124.1 Median Palm Ulnar Palm 0.1   Ulnar Palm    1.5 <2.2 22.5       Electromyography   Side Muscle Ins.Act Fibs Fasc Recrt Amp Dur Poly Activation  Comment  Left 1stDorInt Nml Nml Nml Nml Nml Nml Nml Nml N/A  Left Abd Poll Brev Nml Nml Nml Nml Nml Nml Nml Nml N/A  Left PronatorTeres Nml Nml Nml Nml Nml Nml Nml Nml N/A  Left Biceps Nml Nml Nml Nml Nml Nml Nml Nml N/A  Left Triceps Nml Nml Nml Nml Nml Nml Nml Nml N/A  Left Deltoid Nml Nml Nml Nml Nml Nml Nml Nml N/A  Right AntTibialis Nml Nml Nml Nml Nml Nml Nml Nml N/A  Right Gastroc Nml Nml Nml Nml Nml Nml Nml Nml N/A  Right Flex Dig Long Nml Nml Nml Nml Nml Nml Nml Nml N/A  Right RectFemoris Nml Nml Nml Nml Nml Nml Nml Nml N/A  Right GluteusMed Nml Nml Nml Nml Nml Nml Nml Nml N/A      Waveforms:

## 2023-03-22 DIAGNOSIS — M7712 Lateral epicondylitis, left elbow: Secondary | ICD-10-CM | POA: Diagnosis not present

## 2023-03-27 DIAGNOSIS — M25512 Pain in left shoulder: Secondary | ICD-10-CM | POA: Diagnosis not present

## 2023-03-27 DIAGNOSIS — M25522 Pain in left elbow: Secondary | ICD-10-CM | POA: Diagnosis not present

## 2023-03-29 ENCOUNTER — Telehealth: Payer: Self-pay

## 2023-03-29 ENCOUNTER — Other Ambulatory Visit: Payer: Self-pay | Admitting: Internal Medicine

## 2023-03-29 DIAGNOSIS — L299 Pruritus, unspecified: Secondary | ICD-10-CM

## 2023-03-29 DIAGNOSIS — M359 Systemic involvement of connective tissue, unspecified: Secondary | ICD-10-CM

## 2023-03-29 NOTE — Telephone Encounter (Signed)
Last Fill: Plaquenil: 12/28/2022 Atarax: 12/25/2022  Eye exam: 07/03/2022 WNL   Labs: 02/19/2023 No significant increase in the dsDNA titer compared to previous months when her symptoms were less. There is a small but insignificant increase in urine protein but also don't think this would account for her degree of swelling. I think next best step would be referral to NCS/EMG for her numbness symptoms. This would also help with evaluating if there is a more serious neurologic disorder although I don't see much evidence of that overall.  Next Visit: 05/15/2023  Last Visit: 02/19/2023  [id number] lupus erythematosus, unspecified SLE type, unspecified organ involvement status   Current Dose per office note 02/19/2023: Plan to continue hydroxychloroquine 400 mg daily. Atarax not mentioned.   Okay to refill Plaquenil and Atarax?

## 2023-03-29 NOTE — Progress Notes (Signed)
Nerve conduction study was unremarkable. That is good that there is no major nerve dysfunction. This does not entirely exclude inflammation or irritation of small nerve fibers.

## 2023-03-29 NOTE — Telephone Encounter (Signed)
Patient left a message stating she was calling the office back. After reviewing the chart, did not see any outgoing call to the patient. Attempted to contact the patient and left a message to call the office back.

## 2023-04-03 ENCOUNTER — Telehealth: Payer: Self-pay | Admitting: Pharmacist

## 2023-04-03 DIAGNOSIS — M329 Systemic lupus erythematosus, unspecified: Secondary | ICD-10-CM | POA: Diagnosis not present

## 2023-04-03 NOTE — Telephone Encounter (Signed)
Received fax from [facility] regarding SAPHNELO infusion that patient received on 04/03/23. Labs were not drawn.  Patient tolerated infusion without complications.  Changes/concerns since last visit: none  Next Saphnelo infusion scheduled for 05/03/23  Chesley Mires, PharmD, MPH, BCPS, CPP Clinical Pharmacist (Rheumatology and Pulmonology)

## 2023-04-05 ENCOUNTER — Encounter: Payer: BC Managed Care – PPO | Admitting: Neurology

## 2023-04-05 DIAGNOSIS — M7712 Lateral epicondylitis, left elbow: Secondary | ICD-10-CM | POA: Diagnosis not present

## 2023-04-10 DIAGNOSIS — M7712 Lateral epicondylitis, left elbow: Secondary | ICD-10-CM | POA: Diagnosis not present

## 2023-04-29 ENCOUNTER — Other Ambulatory Visit: Payer: Self-pay | Admitting: Internal Medicine

## 2023-04-29 DIAGNOSIS — I872 Venous insufficiency (chronic) (peripheral): Secondary | ICD-10-CM

## 2023-04-30 NOTE — Telephone Encounter (Signed)
Last Fill: 02/02/2023  Next Visit: 05/15/2023  Last Visit: 02/19/2023  Dx: Systemic lupus erythematosus, unspecified SLE type, unspecified organ involvement status   Current Dose per office note on 02/19/2023: lack of responsiveness to Lasix so does not seem to be overall volume overload   Okay to refill Lasix?

## 2023-04-30 NOTE — Addendum Note (Signed)
Addended by: Fuller Plan on: 04/30/2023 03:11 PM   Modules accepted: Orders

## 2023-05-03 ENCOUNTER — Telehealth: Payer: Self-pay | Admitting: Pharmacist

## 2023-05-03 DIAGNOSIS — M329 Systemic lupus erythematosus, unspecified: Secondary | ICD-10-CM | POA: Diagnosis not present

## 2023-05-03 NOTE — Telephone Encounter (Signed)
Received fax from [facility] regarding SAPHNELO infusion that patient received on 05/03/23. Labs were not drawn.  Patient tolerated infusion without complications.  Changes/concerns since last visit: NONE  Next SAPHNELO infusion scheduled for 06/01/23  Chesley Mires, PharmD, MPH, BCPS, CPP Clinical Pharmacist (Rheumatology and Pulmonology)

## 2023-05-06 DIAGNOSIS — R030 Elevated blood-pressure reading, without diagnosis of hypertension: Secondary | ICD-10-CM | POA: Diagnosis not present

## 2023-05-06 DIAGNOSIS — J069 Acute upper respiratory infection, unspecified: Secondary | ICD-10-CM | POA: Diagnosis not present

## 2023-05-06 DIAGNOSIS — E663 Overweight: Secondary | ICD-10-CM | POA: Diagnosis not present

## 2023-05-06 DIAGNOSIS — Z6827 Body mass index (BMI) 27.0-27.9, adult: Secondary | ICD-10-CM | POA: Diagnosis not present

## 2023-05-11 ENCOUNTER — Ambulatory Visit (INDEPENDENT_AMBULATORY_CARE_PROVIDER_SITE_OTHER): Payer: BC Managed Care – PPO | Admitting: Obstetrics and Gynecology

## 2023-05-11 ENCOUNTER — Encounter: Payer: Self-pay | Admitting: Obstetrics and Gynecology

## 2023-05-11 VITALS — BP 122/84 | HR 92 | Ht 67.0 in | Wt 186.0 lb

## 2023-05-11 DIAGNOSIS — Z712 Person consulting for explanation of examination or test findings: Secondary | ICD-10-CM

## 2023-05-11 NOTE — Progress Notes (Unsigned)
GYNECOLOGY  VISIT   HPI: 42 y.o.   Single White or Caucasian Not Hispanic or Latino  female   [id number] with Patient's last menstrual period was 10/10/2021.   here for   test result consult. She was seen earlier this year with a solitary vulvar ulcer. Negative STD testing, negative HSV serology.  She hasn't had any further ulcers.   GYNECOLOGIC HISTORY: Patient's last menstrual period was 10/10/2021. Contraception:hyst/vasectomy Menopausal hormone therapy: n/a        OB History     Gravida  0   Para  0   Term  0   Preterm  0   AB  0   Living  0      SAB  0   IAB  0   Ectopic  0   Multiple  0   Live Births  0              Patient Active Problem List   Diagnosis Date Noted   Localized swelling of right lower leg 11/01/2022   Chronic venous insufficiency of lower extremity 11/01/2022   Hyperglycemia 05/23/2022   Obesity without serious comorbidity 05/23/2022   Rash and other nonspecific skin eruption 04/06/2022   Leukocytosis 03/06/2022   Redness 11/14/2021   Blurry vision 11/07/2021   Hot flash not due to menopause 11/04/2021   S/P laparoscopic hysterectomy 10/31/2021   Urinary urgency 09/09/2021   Itching 09/09/2021   High risk medication use 07/25/2021   Finger joint swelling, left 04/01/2021   Systemic lupus erythematosus (HCC) 04/01/2021   Fibromyalgia 04/01/2021   Alopecia 11/17/2020   Other fatigue 10/12/2020   Migraine without aura    History of DVT (deep vein thrombosis)    History of endometriosis    Right-sided muscle weakness 05/15/2019   Abnormal finding on MRI of brain 05/15/2019   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   Tremor 12/24/2017    Past Medical History:  Diagnosis Date   aub    Bruises easily 10/24/2021   Complication of anesthesia 10/15/2020   hypotension   Fibromyalgia    GERD (gastroesophageal reflux disease)    Heart murmur  PFO    off 81 mg aspirin since feb 2022 per pcp  no cardiologist   Hemangioma    History  of DVT (deep vein thrombosis)    right le 7 yrs ago, right leg slight swollen   History of endometriosis    History of hiatal hernia    Hypotension    with 10-15-2020 endoscopy   Long-term corticosteroid use 02/20/2022   Corticosteroid   Migraine without aura    Systemic lupus erythematosus (HCC)    TIA    ages 42 and 78   Vascular insufficiency    Viral meningitis    age 28    Past Surgical History:  Procedure Laterality Date   ABDOMINAL HYSTERECTOMY     ANTERIOR CRUCIATE LIGAMENT REPAIR Left 10/2018   CYSTOSCOPY  10/31/2021   Procedure: CYSTOSCOPY;  Surgeon: Romualdo Bolk, MD;  Location: [facility];  Service: Gynecology;;   PELVIC LAPAROSCOPY     for endometriosis yrs ago per pt on 10-24-2021   right knee lateral release and meniscal repair  2011   TONSILLECTOMY     age 61, adenoids removed also   TOTAL LAPAROSCOPIC HYSTERECTOMY WITH SALPINGECTOMY Bilateral 10/31/2021   Procedure: TOTAL LAPAROSCOPIC HYSTERECTOMY WITH SALPINGECTOMY;  Surgeon: Romualdo Bolk, MD;  Location: [facility];  Service: Gynecology;  Laterality: Bilateral;  UMBILICAL HERNIA REPAIR     yrs ago   UPPER GI ENDOSCOPY     with esophagus stretching 10-15-2020, 03-11-2020, 05-09-2019,  2013 with being polyp removed    Current Outpatient Medications  Medication Sig Dispense Refill   Anifrolumab-fnia 300 MG/2ML SOLN Inject 300 mg into the vein every 28 (twenty-eight) days.     cyclobenzaprine (FLEXERIL) 10 MG tablet TAKE 1 TABLET BY MOUTH AT BEDTIME AS NEEDED FOR MUSCLE SPASMS. 30 tablet 2   furosemide (LASIX) 20 MG tablet TAKE 1 TABLET BY MOUTH EVERY DAY AS NEEDED 90 tablet 0   hydroxychloroquine (PLAQUENIL) 200 MG tablet TAKE 2 TABLETS BY MOUTH EVERY DAY 180 tablet 0   hydrOXYzine (ATARAX) 25 MG tablet TAKE AT NIGHT OR UP TO 2 TIMES DAILY AS NEEDED FOR ITCHING 180 tablet 0   No current facility-administered medications for this visit.     ALLERGIES:  Cephalexin, Penicillins, Amitiza [lubiprostone], and Sulfa antibiotics  Family History  Problem Relation Age of Onset   Alcohol abuse Mother    Cancer Father        Lung and Colon   Heart attack Father    COPD Father    Breast cancer Paternal Aunt        51's   Ovarian cancer Paternal Aunt        43's   Diabetes Maternal Grandmother    Cervical cancer Maternal Grandmother    Fibromyalgia Maternal Grandmother    Breast cancer Maternal Grandmother        ? age of dx   Prostate cancer Maternal Grandfather    Heart attack Maternal Grandfather    ALS Paternal Grandmother    Prostate cancer Paternal Grandfather    Heart attack Paternal Grandfather    Esophageal cancer Paternal Grandfather    Breast cancer Other    Colon cancer Neg Hx    Stomach cancer Neg Hx    Rectal cancer Neg Hx     Social History   Socioeconomic History   Marital status: Single    Spouse name: Not on file   Number of children: Not on file   Years of education: Not on file   Highest education level: Not on file  Occupational History   Not on file  Tobacco Use   Smoking status: Never    Passive exposure: Never   Smokeless tobacco: Never  Vaping Use   Vaping Use: Never used  Substance and Sexual Activity   Alcohol use: Not Currently   Drug use: Never   Sexual activity: Yes    Birth control/protection: Surgical    Comment: partner with vasectomy  Other Topics Concern   Not on file  Social History Narrative   Not on file   Social Determinants of Health   Financial Resource Strain: Not on file  Food Insecurity: Not on file  Transportation Needs: Not on file  Physical Activity: Not on file  Stress: Not on file  Social Connections: Not on file  Intimate Partner Violence: Not on file    Review of Systems  All other systems reviewed and are negative.   PHYSICAL EXAMINATION:    LMP 10/10/2021     General appearance: alert, cooperative and appears stated age  67. Encounter to discuss test  results Negative evaluation, no further vulvar ulcers. Patient reassured Call with further symptoms

## 2023-05-14 NOTE — Progress Notes (Signed)
Office Visit Note  Patient: Christy Lowe             Date of Birth: 11-30-1980           MRN: [id number]             PCP: Georgina Quint, MD Referring: Georgina Quint, * Visit Date: 05/15/2023   Subjective:   History of Present Illness: Kamirah Haefele Hainline is a 42 y.o. female here for follow up for systemic lupus on hydroxychloroquine 400 mg daily, Saphnelo 300 mg IV monthly.  No new severe disease flareup.  She is seeing definite benefit with her infusions but notices some waning efficacy especially in the last week between dosing this was noticeable she thinks the last 4 treatments.  Also having issue with persistent skin problems legs are very itchy without visible rashes.  She is trying multiple medications for this including Allegra and the hydroxyzine with neurologist partially helpful.  Had a recent sinus infection but with prolonged course had x-ray concerning for left upper lung infiltrate so was treated with a Z-Pak.  No other infections or antibiotics since her last visit.   Previous HPI 02/19/23 Ferdie Ping Aydin is a 42 y.o. female here for follow up for SLE on hydroxychloroquine 400 mg daily and Saphnelo 300 mg IV monthly.  Symptoms with arthritis, rashes, mucosal ulcers, pedal edema.  Since her last visit is recently started to do a lot worse with biggest complaint of increased swelling.  She has been noticing discoloration and puffiness especially in the fingers on both hands.  Also somewhat more leg swelling.  She has tried taking the Lasix and not seeing a large increase in your output nor any improvement in the leg swelling.  She has been having what feels like UTIs about monthly.  Predominantly urinary urgency and frequency no other associated symptoms.  Skin rashes have been ongoing around the neck and jaw area about the same as before.  Does not see discrete joint swelling so much as the overall puffiness but does have increased pain and stiffness in both  hands. Sometimes going numb and particularly has trouble gripping tightly cannot seem to exert a normal amount of force.    Previous HPI 11/07/22 Ferdie Ping Asbill is a 42 y.o. female here for follow up for SLE on HCQ 400 mg daily and saphnelo 300 mg IV monthly. Symptoms including arthritis, rashes, mucosal ulcers and pedal edema.  She is noticing a good benefit with the Saphnelo infusions but describes pain in her joints and generalized sensitivity gets worse again by at least 1 week before each next treatment is due.  Noticing a few new areas of swelling of her hands and skin discoloration on her hands the rashes around her head and neck are about the same.  She had recent evaluation at [facility] with no superficial veins amenable to procedure intervention identified.  The swelling is becoming faster to develop and somewhat more extensive she notices mild discoloration around the lower shin and ankle described a bluish hue when very swollen.  She quit her job due to these problems knowing that the stress of working continuous 12-hour shifts was worsening symptoms and having to spend some many months in [place] or travel throughout the country was very problematic for maintaining medication access.     Previous HPI 08/09/22 Ferdie Ping Morneault is a 42 y.o. female here for follow up for lupus on HCQ 400 mg daily and saphnelo 300 mg IV  monthly.  Her joint symptoms have remained pretty well controlled after completely tapering off the prednisone.  She is noticing a difference in symptoms after the Saphnelo infusion versus 4 weeks later proceeding her next dose.  Continues to have very little appetite but not seeing any weight reduction.  Skin itching especially on her upper legs is a persistent issue she is using topical emollients with partial benefit.  More recently she is also experiencing an increase in urinary frequency both daytime and increased nocturia.  No visible blood in urine or dysuria.  She is currently  anticipating an upcoming plastic surgery needing adjustments for perioperative drug management.   Previous HPI 05/01/2022 Ferdie Ping Mandich is a 42 y.o. female here for follow up for lupus on HCQ 400 mg daily started saphnelo infusion and prednisone tapering down to 2.5 mg daily now. So far she is noticing some improvement with overall severity of skin redness swelling and itching but definitely not resolved.  Face and chest continue having the most erythema.  She has noticed some bruising without any specific cause.  She is noticing some swelling or otherwise thickening in the joints of her right hand and around the right wrist.   Previous HPI 04/06/2022 Ferdie Ping Blass is a 42 y.o. female here for follow up for lupus on HCQ 400 mg daily started saphnelo infusion last month and prednisone tapering down to 2.5 mg daily now. She felt a large improvement after her first treatment with decreased joint pains and decreased abdominal pain. She is tapering off the prednisone without severe problem. She has noticed oral ulcers on the top of her mouth for several days and is having a rash with small papules on both sides of her neck and lower face. Appetite is decreased but not losing any weight. She feels a lot of stress after dealing with work incident.   Previous HPI 03/06/2022 Ferdie Ping Dileonardo is a 42 y.o. female here for follow up for lupus on HCQ 400 mg daily and prednisone tapering down to 10 mg daily after increase to 30 last month from flare up with mouth ulcers and diffuse rashes. She had several complications with high dose prednisone increased weight, mood irritability, and wound dehiscence af hysterectomy cuff site. We discussed plan to start saphenlo infusion as a replacement for benlysta treatment which did not improve symptoms or lab abnormalities much after about 6 months treatment.  She has some improvement in the mouth and lip ulcers she is not sure whether starting the valacyclovir about 4 days  ago has had significant effect.  She completed the course of oral antibiotics abdominal pain is partially better. She is scheduled for first saphnelo infusion Thursday and has OBGYN followup later this week.     Previous HPI 12/13/21 Ferdie Ping Tester is a 42 y.o. female here for follow up for systemic lupus with ongoing joint pains and skin rash on HCQ 400 mg daily, Benlysta 200 mg [place] weekly, and prednisone 10 mg daily. Since our last visit she went for hysterectomy this was uneventful and has recovered well. She was off the benlysta for about 1 month in total around the surgery. She has not felt much improvement so far still having a lot of fatigue, skin rash and flushing, joint pain in several areas with left wrist swelling. Headaches are about the same as before but now having recurrent episodes of vertigo. She is now taking hydroxyzine twice on some days due to persistent itching, and feels excessively hot  most of the time. She Is back to taking 10 mg of prednisone when trying to drop this to 5 mg felt extreme fatigue and back pain that felt like bruising and stiffness all over her body.   Previous HPI 11/17/20 Ferdie Ping Evelyn is a 42 y.o. female here for evaluation of positive ANA. She is noticing multiple symptoms including generalized alopecia, oral dryness, left sided neck swelling, joint pain and stiffness, position tremor, and episodic skin rashes. She has had multiple ongoing problems since many years ago. She was hospitalized for viral meningitis at age 68 and feels she has been susceptible to infections ever since this time or before, with numerous cases of bronchitis and has had some lung nodules on CT imaging during these repeat episodes. About 7 years ago she suffered from a RLE DVT provoked after right knee arthroscopy and developed right sided face and arm numbness. Workup indicated she had a TIA also noted to have PFO. She takes ASA since that time and has taken anticoagulation in  perioperative periods. Workup with MRI and LP studies were apparently also indicative for MS. She was treated with multiple rounds of steroids for months at a time and felt a lot of mood disturbance from these. She was never started on other immunomodulatory or immunosuppressive treatments.   Review of Systems  Constitutional:  Positive for fatigue.  HENT:  Positive for mouth sores. Negative for mouth dryness.   Eyes:  Negative for dryness.  Respiratory:  Positive for shortness of breath.   Cardiovascular:  Negative for chest pain and palpitations.  Gastrointestinal:  Negative for blood in stool, constipation and diarrhea.  Endocrine: Positive for increased urination.  Genitourinary:  Positive for involuntary urination.  Musculoskeletal:  Positive for joint pain, joint pain, joint swelling, myalgias, muscle weakness, morning stiffness and myalgias. Negative for gait problem and muscle tenderness.  Skin:  Positive for rash. Negative for color change, hair loss and sensitivity to sunlight.  Allergic/Immunologic: Positive for susceptible to infections.  Neurological:  Negative for dizziness and headaches.  Hematological:  Positive for swollen glands.  Psychiatric/Behavioral:  Negative for depressed mood and sleep disturbance. The patient is not nervous/anxious.     PMFS History:  Patient Active Problem List   Diagnosis Date Noted   Localized swelling of right lower leg 11/01/2022   Chronic venous insufficiency of lower extremity 11/01/2022   Hyperglycemia 05/23/2022   Obesity without serious comorbidity 05/23/2022   Rash and other nonspecific skin eruption 04/06/2022   Leukocytosis 03/06/2022   Redness 11/14/2021   Blurry vision 11/07/2021   Hot flash not due to menopause 11/04/2021   S/P laparoscopic hysterectomy 10/31/2021   Urinary urgency 09/09/2021   Itching 09/09/2021   High risk medication use 07/25/2021   Finger joint swelling, left 04/01/2021   Systemic lupus erythematosus  (HCC) 04/01/2021   Fibromyalgia 04/01/2021   Alopecia 11/17/2020   Other fatigue 10/12/2020   Migraine without aura    History of DVT (deep vein thrombosis)    History of endometriosis    Right-sided muscle weakness 05/15/2019   Abnormal finding on MRI of brain 05/15/2019   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   Tremor 12/24/2017    Past Medical History:  Diagnosis Date   aub    Bruises easily 10/24/2021   Complication of anesthesia 10/15/2020   hypotension   Fibromyalgia    GERD (gastroesophageal reflux disease)    Heart murmur  PFO    off 81 mg aspirin since feb 2022 per  pcp  no cardiologist   Hemangioma    History of DVT (deep vein thrombosis)    right le 7 yrs ago, right leg slight swollen   History of endometriosis    History of hiatal hernia    Hypotension    with 10-15-2020 endoscopy   Long-term corticosteroid use 02/20/2022   Corticosteroid   Migraine without aura    Systemic lupus erythematosus (HCC)    TIA    ages 79 and 9   Vascular insufficiency    Viral meningitis    age 38    Family History  Problem Relation Age of Onset   Alcohol abuse Mother    Cancer Father        Lung and Colon   Heart attack Father    COPD Father    Breast cancer Paternal Aunt        70's   Ovarian cancer Paternal Aunt        54's   Diabetes Maternal Grandmother    Cervical cancer Maternal Grandmother    Fibromyalgia Maternal Grandmother    Breast cancer Maternal Grandmother        ? age of dx   Prostate cancer Maternal Grandfather    Heart attack Maternal Grandfather    ALS Paternal Grandmother    Prostate cancer Paternal Grandfather    Heart attack Paternal Grandfather    Esophageal cancer Paternal Grandfather    Breast cancer Other    Colon cancer Neg Hx    Stomach cancer Neg Hx    Rectal cancer Neg Hx    Past Surgical History:  Procedure Laterality Date   ABDOMINAL HYSTERECTOMY     ANTERIOR CRUCIATE LIGAMENT REPAIR Left 10/2018   CYSTOSCOPY  10/31/2021    Procedure: CYSTOSCOPY;  Surgeon: Romualdo Bolk, MD;  Location: [facility];  Service: Gynecology;;   PELVIC LAPAROSCOPY     for endometriosis yrs ago per pt on 10-24-2021   right knee lateral release and meniscal repair  2011   TONSILLECTOMY     age 76, adenoids removed also   TOTAL LAPAROSCOPIC HYSTERECTOMY WITH SALPINGECTOMY Bilateral 10/31/2021   Procedure: TOTAL LAPAROSCOPIC HYSTERECTOMY WITH SALPINGECTOMY;  Surgeon: Romualdo Bolk, MD;  Location: [facility] ;  Service: Gynecology;  Laterality: Bilateral;   UMBILICAL HERNIA REPAIR     yrs ago   UPPER GI ENDOSCOPY     with esophagus stretching 10-15-2020, 03-11-2020, 05-09-2019,  2013 with being polyp removed   Social History   Social History Narrative   Not on file   Immunization History  Administered Date(s) Administered   PFIZER(Purple Top)SARS-COV-2 Vaccination 06/27/2020, 07/28/2020   PPD Test 01/23/2019     Objective: Vital Signs: BP 126/80 (BP Location: Left Arm, Patient Position: Sitting, Cuff Size: Small)   Pulse 75   Resp 12   Ht 5\' 7"  (1.702 m)   Wt 174 lb (78.9 kg)   LMP 10/10/2021   BMI 27.25 kg/m    Physical Exam HENT:     Mouth/Throat:     Mouth: Mucous membranes are moist.     Pharynx: Oropharynx is clear.  Eyes:     Conjunctiva/sclera: Conjunctivae normal.  Cardiovascular:     Rate and Rhythm: Normal rate and regular rhythm.  Pulmonary:     Effort: Pulmonary effort is normal.     Breath sounds: Normal breath sounds.  Musculoskeletal:     Comments: Trace mostly nonpitting edema b/l feet and ankles  Skin:    Findings:  Rash present.     Comments: Few mildly erythematous bumps along jaw and neck  Neurological:     Mental Status: She is alert.  Psychiatric:        Mood and Affect: Mood normal.      Musculoskeletal Exam:  Shoulders full ROM no tenderness or swelling Elbows full ROM no tenderness or swelling Wrists full ROM no tenderness or  swelling Fingers full ROM no tenderness or swelling Knees full ROM no tenderness or swelling Ankles full ROM no tenderness or swelling   Investigation: No additional findings.  Imaging: No results found.  Recent Labs: Lab Results  Component Value Date   WBC 5.7 05/15/2023   HGB 13.6 05/15/2023   PLT 350 05/15/2023   NA 138 05/15/2023   K 4.5 05/15/2023   CL 104 05/15/2023   CO2 26 05/15/2023   GLUCOSE 84 05/15/2023   BUN 19 05/15/2023   CREATININE 0.90 05/15/2023   BILITOT 0.5 05/15/2023   ALKPHOS 47 10/26/2021   AST 23 05/15/2023   ALT 13 05/15/2023   PROT 6.8 05/15/2023   ALBUMIN 4.9 10/26/2021   CALCIUM 9.6 05/15/2023   GFRAA >60 03/10/2020   QFTBGOLDPLUS NEGATIVE 05/15/2023    Speciality Comments: PL[facility] Eye Exam [facility] 07/03/2022 WNL Follow-up: 12 months   Procedures:  No procedures performed Allergies: Cephalexin, Penicillins, Amitiza [lubiprostone], and Sulfa antibiotics   Assessment / Plan:     Visit Diagnoses: Systemic lupus erythematosus, unspecified SLE type, unspecified organ involvement status (HCC) - Plan: Sedimentation rate, C3 and C4, Anti-DNA antibody, double-stranded  Most prominent complaint at this time are the ongoing skin symptoms but that is more intensely pruritic and not much visible inflammation.  Joint inflammation, mucosal ulcers, and overall swelling doing significantly better and has not required any maintenance steroid for months.  Will recheck sed rate complements and double-stranded DNA assessment for disease activity monitoring.  Plan to continue on hydroxychloroquine 400 mg daily and Saphnelo 300 mg IV monthly. After result reviewed current SLEDAI score of 2 for persistent elevation in double-stranded DNA but no other clinical score at this time.  High risk medication use - Plan: CBC with Differential/Platelet, COMPLETE METABOLIC PANEL WITH GFR, QuantiFERON-TB Gold Plus  Checking CBC CMP and QuantiFERON for medication  monitoring on continued use of hydroxychloroquine and Saphnelo.  Most recent ophthalmology exam from last year was normal.  Had 1 respiratory infection since our last visit.  Rash and other nonspecific skin eruption  Rash on face and neck is partially improved looks very mild today.  The more diffuse intense itching without visible discoloration is still bothersome.  Continuing on oral antihistamines and topical emollients with partial benefit.  Alopecia  Having some generalized thinning and hair fragility no focal rashes or abnormal finding on exam.   Orders: Orders Placed This Encounter  Procedures   Sedimentation rate   C3 and C4   Anti-DNA antibody, double-stranded   CBC with Differential/Platelet   COMPLETE METABOLIC PANEL WITH GFR   QuantiFERON-TB Gold Plus   No orders of the defined types were placed in this encounter.    Follow-Up Instructions: Return in about 3 months (around 08/15/2023) for SLE on saphnelo/HCQ f/u 3mos.   Fuller Plan, MD  Note - This record has been created using [employer].  Chart creation errors have been sought, but may not always  have been located. Such creation errors do not reflect on  the standard of medical care.

## 2023-05-15 ENCOUNTER — Ambulatory Visit: Payer: BC Managed Care – PPO | Attending: Internal Medicine | Admitting: Internal Medicine

## 2023-05-15 ENCOUNTER — Ambulatory Visit: Payer: BC Managed Care – PPO | Admitting: Internal Medicine

## 2023-05-15 ENCOUNTER — Encounter: Payer: Self-pay | Admitting: Internal Medicine

## 2023-05-15 VITALS — BP 126/80 | HR 75 | Resp 12 | Ht 67.0 in | Wt 174.0 lb

## 2023-05-15 DIAGNOSIS — Z79899 Other long term (current) drug therapy: Secondary | ICD-10-CM | POA: Diagnosis not present

## 2023-05-15 DIAGNOSIS — L659 Nonscarring hair loss, unspecified: Secondary | ICD-10-CM

## 2023-05-15 DIAGNOSIS — R21 Rash and other nonspecific skin eruption: Secondary | ICD-10-CM | POA: Diagnosis not present

## 2023-05-15 DIAGNOSIS — M329 Systemic lupus erythematosus, unspecified: Secondary | ICD-10-CM

## 2023-05-16 ENCOUNTER — Other Ambulatory Visit: Payer: Self-pay | Admitting: Internal Medicine

## 2023-05-16 LAB — COMPLETE METABOLIC PANEL WITH GFR
AG Ratio: 1.7 (calc) (ref 1.0–2.5)
CO2: 26 mmol/L (ref 20–32)
Chloride: 104 mmol/L (ref 98–110)
Creat: 0.9 mg/dL (ref 0.50–0.97)

## 2023-05-17 LAB — COMPLETE METABOLIC PANEL WITH GFR
ALT: 13 U/L (ref 6–29)
AST: 23 U/L (ref 10–30)
Albumin: 4.3 g/dL (ref 3.6–5.1)
Alkaline phosphatase (APISO): 66 U/L (ref 31–125)
BUN: 19 mg/dL (ref 7–25)
Calcium: 9.6 mg/dL (ref 8.6–10.2)
Globulin: 2.5 g/dL (calc) (ref 1.9–3.7)
Glucose, Bld: 84 mg/dL (ref 65–99)
Potassium: 4.5 mmol/L (ref 3.5–5.3)
Sodium: 138 mmol/L (ref 135–146)
Total Bilirubin: 0.5 mg/dL (ref 0.2–1.2)
Total Protein: 6.8 g/dL (ref 6.1–8.1)
eGFR: 83 mL/min/{1.73_m2} (ref >=60)

## 2023-05-17 LAB — CBC WITH DIFFERENTIAL/PLATELET
Absolute Monocytes: 450 cells/uL (ref 200–950)
Basophils Absolute: 40 cells/uL (ref 0–200)
Basophils Relative: 0.7 %
Eosinophils Absolute: 91 cells/uL (ref 15–500)
Eosinophils Relative: 1.6 %
HCT: 39.8 % (ref 35.0–45.0)
Hemoglobin: 13.6 g/dL (ref 11.7–15.5)
Lymphs Abs: 1978 cells/uL (ref 850–3900)
MCH: 30 pg (ref 27.0–33.0)
MCHC: 34.2 g/dL (ref 32.0–36.0)
MCV: 87.9 fL (ref 80.0–100.0)
MPV: 9 fL (ref 7.5–12.5)
Monocytes Relative: 7.9 %
Neutro Abs: 3141 cells/uL (ref 1500–7800)
Neutrophils Relative %: 55.1 %
Platelets: 350 10*3/uL (ref 140–400)
RBC: 4.53 10*6/uL (ref 3.80–5.10)
RDW: 12.5 % (ref 11.0–15.0)
Total Lymphocyte: 34.7 %
WBC: 5.7 10*3/uL (ref 3.8–10.8)

## 2023-05-17 LAB — C3 AND C4
C3 Complement: 143 mg/dL (ref 83–193)
C4 Complement: 25 mg/dL (ref 15–57)

## 2023-05-17 LAB — ANTI-DNA ANTIBODY, DOUBLE-STRANDED: ds DNA Ab: 12 IU/mL — ABNORMAL HIGH

## 2023-05-17 LAB — QUANTIFERON-TB GOLD PLUS
Mitogen-NIL: >10 IU/mL
NIL: 0.03 IU/mL
QuantiFERON-TB Gold Plus: NEGATIVE
TB1-NIL: 0.01 IU/mL
TB2-NIL: 0 IU/mL

## 2023-05-17 LAB — SEDIMENTATION RATE: Sed Rate: 9 mm/h (ref 0–20)

## 2023-05-17 NOTE — Telephone Encounter (Signed)
Last Fill: 02/12/2023  Next Visit: 08/08/2023  Last Visit: 05/15/2023  Dx: not mentioned  Current Dose per office note on 05/15/2023: not mentioned  Okay to refill Flexeril?

## 2023-06-01 ENCOUNTER — Telehealth: Payer: Self-pay | Admitting: Internal Medicine

## 2023-06-01 ENCOUNTER — Encounter: Payer: Self-pay | Admitting: Internal Medicine

## 2023-06-01 DIAGNOSIS — R21 Rash and other nonspecific skin eruption: Secondary | ICD-10-CM

## 2023-06-01 DIAGNOSIS — M329 Systemic lupus erythematosus, unspecified: Secondary | ICD-10-CM | POA: Diagnosis not present

## 2023-06-01 NOTE — Telephone Encounter (Signed)
Patient called and also sent a mychart message in regards to this referral. I contact Dr. Dimple Casey and asked for the approval to place referral. Dr. Dimple Casey said okay to refer. Referral has been placed and faxed. Sent patient a Clinical cytogeneticist message to advise.

## 2023-06-01 NOTE — Telephone Encounter (Signed)
Pt asking a referral to be sent to dermatologist in [place]. Pt states this is an urgent referral.

## 2023-06-05 ENCOUNTER — Telehealth: Payer: Self-pay | Admitting: Pharmacist

## 2023-06-05 NOTE — Telephone Encounter (Signed)
Received fax from [facility] regarding Saphnelo infusion that patient received on 06/01/2023. Labs were not drawn.  Patient tolerated infusion without complications.  Changes/concerns since last visit: none  Next Saphnelo infusion scheduled for 06/29/2023   Chesley Mires, PharmD, MPH, BCPS, CPP Clinical Pharmacist (Rheumatology and Pulmonology)

## 2023-06-25 ENCOUNTER — Telehealth: Payer: Self-pay | Admitting: Internal Medicine

## 2023-06-25 NOTE — Telephone Encounter (Signed)
Patient called back with Megan from Dr. Felicita Gage office on the phone.  Aundra Millet requested the referral be faxed to [phone] or you can call with a verbal referral at #[phone]

## 2023-06-25 NOTE — Telephone Encounter (Signed)
See mychart message for details.

## 2023-06-25 NOTE — Telephone Encounter (Signed)
Patient called stating Christy Lowe at Dr. Felicita Gage office still hasn't received her referral.  Patient states she has an appointment scheduled for 07/17/23, but without the referral cannot be seen earlier.

## 2023-06-29 DIAGNOSIS — M329 Systemic lupus erythematosus, unspecified: Secondary | ICD-10-CM | POA: Diagnosis not present

## 2023-07-02 ENCOUNTER — Telehealth: Payer: Self-pay | Admitting: Pharmacist

## 2023-07-02 NOTE — Telephone Encounter (Signed)
Received fax from [facility] regarding SAPHNELO infusion that patient received on 06/29/2023. Labs were not drawn.  Patient tolerated infusion without complications.  Changes/concerns since last visit:   Next [facility] infusion scheduled for 07/27/2023  Chesley Mires, PharmD, MPH, BCPS, CPP Clinical Pharmacist (Rheumatology and Pulmonology)

## 2023-07-06 ENCOUNTER — Other Ambulatory Visit: Payer: Self-pay | Admitting: Internal Medicine

## 2023-07-06 DIAGNOSIS — M359 Systemic involvement of connective tissue, unspecified: Secondary | ICD-10-CM

## 2023-07-06 NOTE — Telephone Encounter (Signed)
Last Fill: 03/29/2023  Eye exam: 07/03/2022 WNL   Labs: 05/15/2023 normal  Next Visit: 08/08/2023  Last Visit: 05/15/2023  NW:[id number] lupus erythematosus, unspecified SLE type, unspecified organ involvement status   Current Dose per office note 05/15/2023: hydroxychloroquine 400 mg daily   Contacted the patient to advise PLQ eye exam is due. Patient states she thought she had to get one every two years. Advised the patient we like to get one every year. Advised the patient to schedule an appointment.   Okay to refill Plaquenil?

## 2023-07-17 DIAGNOSIS — I7300 Raynaud's syndrome without gangrene: Secondary | ICD-10-CM | POA: Diagnosis not present

## 2023-07-17 DIAGNOSIS — G629 Polyneuropathy, unspecified: Secondary | ICD-10-CM | POA: Diagnosis not present

## 2023-07-17 DIAGNOSIS — M329 Systemic lupus erythematosus, unspecified: Secondary | ICD-10-CM | POA: Diagnosis not present

## 2023-07-25 DIAGNOSIS — M329 Systemic lupus erythematosus, unspecified: Secondary | ICD-10-CM | POA: Diagnosis not present

## 2023-07-25 NOTE — Progress Notes (Signed)
Office Visit Note  Patient: Christy Lowe             Date of Birth: 1981-04-13           MRN: [id number]             PCP: Georgina Quint, MD Referring: Georgina Quint, * Visit Date: 08/08/2023   Subjective:  Follow-up (Patient states she needs to finish her letter for her tint. Patient states she would like to talk about her Dermatology appointment. )   History of Present Illness: Christy Lowe is a 42 y.o. female here for follow up for systemic lupus on hydroxychloroquine 400 mg daily, Saphnelo 300 mg IV monthly.  Last treatment infusion was on August 28 still noticing a improvement immediately afterwards but with some decline currently has active symptoms again only 2 weeks later.  She did have a recent work trip to [place].  Experienced some increased leg swelling after plane flight despite use of compressive sock and taking Lasix.  Still has the active rash with itching and bumps that appear fluid-filled and subsequently unroofed by scratching along the jawline and sides of the neck.  Has experienced a few more painful ulcers most often on the roof of the mouth.   She saw Dr. Reche Dixon for evaluation August 20. He did not see evidence consistent for active cutaneous lupus.  Recommended neurology consultation for suspected MS. previously saw local neurology with negative antibody testing and normal nerve conduction study had not recommended any continued treatment there was treated for several years previously.  Current appointment for this now scheduled in January.  Previous HPI 05/15/2023 Christy Lowe is a 42 y.o. female here for follow up for systemic lupus on hydroxychloroquine 400 mg daily, Saphnelo 300 mg IV monthly.  No new severe disease flareup.  She is seeing definite benefit with her infusions but notices some waning efficacy especially in the last week between dosing this was noticeable she thinks the last 4 treatments.  Also having issue with persistent  skin problems legs are very itchy without visible rashes.  She is trying multiple medications for this including Allegra and the hydroxyzine with neurologist partially helpful.  Had a recent sinus infection but with prolonged course had x-ray concerning for left upper lung infiltrate so was treated with a Z-Pak.  No other infections or antibiotics since her last visit.   Previous HPI 02/19/23 Christy Lowe is a 42 y.o. female here for follow up for SLE on hydroxychloroquine 400 mg daily and Saphnelo 300 mg IV monthly.  Symptoms with arthritis, rashes, mucosal ulcers, pedal edema.  Since her last visit is recently started to do a lot worse with biggest complaint of increased swelling.  She has been noticing discoloration and puffiness especially in the fingers on both hands.  Also somewhat more leg swelling.  She has tried taking the Lasix and not seeing a large increase in your output nor any improvement in the leg swelling.  She has been having what feels like UTIs about monthly.  Predominantly urinary urgency and frequency no other associated symptoms.  Skin rashes have been ongoing around the neck and jaw area about the same as before.  Does not see discrete joint swelling so much as the overall puffiness but does have increased pain and stiffness in both hands. Sometimes going numb and particularly has trouble gripping tightly cannot seem to exert a normal amount of force.    Previous HPI 11/07/22 Christy C.  Lowe is a 42 y.o. female here for follow up for SLE on HCQ 400 mg daily and saphnelo 300 mg IV monthly. Symptoms including arthritis, rashes, mucosal ulcers and pedal edema.  She is noticing a good benefit with the Saphnelo infusions but describes pain in her joints and generalized sensitivity gets worse again by at least 1 week before each next treatment is due.  Noticing a few new areas of swelling of her hands and skin discoloration on her hands the rashes around her head and neck are about  the same.  She had recent evaluation at [facility] with no superficial veins amenable to procedure intervention identified.  The swelling is becoming faster to develop and somewhat more extensive she notices mild discoloration around the lower shin and ankle described a bluish hue when very swollen.  She quit her job due to these problems knowing that the stress of working continuous 12-hour shifts was worsening symptoms and having to spend some many months in [place] or travel throughout the country was very problematic for maintaining medication access.   Previous HPI 08/09/22 Christy Lowe is a 42 y.o. female here for follow up for lupus on HCQ 400 mg daily and saphnelo 300 mg IV monthly.  Her joint symptoms have remained pretty well controlled after completely tapering off the prednisone.  She is noticing a difference in symptoms after the Saphnelo infusion versus 4 weeks later proceeding her next dose.  Continues to have very little appetite but not seeing any weight reduction.  Skin itching especially on her upper legs is a persistent issue she is using topical emollients with partial benefit.  More recently she is also experiencing an increase in urinary frequency both daytime and increased nocturia.  No visible blood in urine or dysuria.  She is currently anticipating an upcoming plastic surgery needing adjustments for perioperative drug management.   Previous HPI 05/01/2022 Christy Lowe is a 42 y.o. female here for follow up for lupus on HCQ 400 mg daily started saphnelo infusion and prednisone tapering down to 2.5 mg daily now. So far she is noticing some improvement with overall severity of skin redness swelling and itching but definitely not resolved.  Face and chest continue having the most erythema.  She has noticed some bruising without any specific cause.  She is noticing some swelling or otherwise thickening in the joints of her right hand and around the right wrist.   Previous  HPI 04/06/2022 Christy Lowe is a 42 y.o. female here for follow up for lupus on HCQ 400 mg daily started saphnelo infusion last month and prednisone tapering down to 2.5 mg daily now. She felt a large improvement after her first treatment with decreased joint pains and decreased abdominal pain. She is tapering off the prednisone without severe problem. She has noticed oral ulcers on the top of her mouth for several days and is having a rash with small papules on both sides of her neck and lower face. Appetite is decreased but not losing any weight. She feels a lot of stress after dealing with work incident.   Previous HPI 03/06/2022 Christy Lowe Garlock is a 42 y.o. female here for follow up for lupus on HCQ 400 mg daily and prednisone tapering down to 10 mg daily after increase to 30 last month from flare up with mouth ulcers and diffuse rashes. She had several complications with high dose prednisone increased weight, mood irritability, and wound dehiscence af hysterectomy cuff site. We discussed plan  to start saphenlo infusion as a replacement for benlysta treatment which did not improve symptoms or lab abnormalities much after about 6 months treatment.  She has some improvement in the mouth and lip ulcers she is not sure whether starting the valacyclovir about 4 days ago has had significant effect.  She completed the course of oral antibiotics abdominal pain is partially better. She is scheduled for first saphnelo infusion Thursday and has OBGYN followup later this week.     Previous HPI 12/13/21 Christy Lowe Dunlop is a 42 y.o. female here for follow up for systemic lupus with ongoing joint pains and skin rash on HCQ 400 mg daily, Benlysta 200 mg [place] weekly, and prednisone 10 mg daily. Since our last visit she went for hysterectomy this was uneventful and has recovered well. She was off the benlysta for about 1 month in total around the surgery. She has not felt much improvement so far still having a lot  of fatigue, skin rash and flushing, joint pain in several areas with left wrist swelling. Headaches are about the same as before but now having recurrent episodes of vertigo. She is now taking hydroxyzine twice on some days due to persistent itching, and feels excessively hot most of the time. She Is back to taking 10 mg of prednisone when trying to drop this to 5 mg felt extreme fatigue and back pain that felt like bruising and stiffness all over her body.   Previous HPI 11/17/20 Christy Lowe Rocca is a 42 y.o. female here for evaluation of positive ANA. She is noticing multiple symptoms including generalized alopecia, oral dryness, left sided neck swelling, joint pain and stiffness, position tremor, and episodic skin rashes. She has had multiple ongoing problems since many years ago. She was hospitalized for viral meningitis at age 47 and feels she has been susceptible to infections ever since this time or before, with numerous cases of bronchitis and has had some lung nodules on CT imaging during these repeat episodes. About 7 years ago she suffered from a RLE DVT provoked after right knee arthroscopy and developed right sided face and arm numbness. Workup indicated she had a TIA also noted to have PFO. She takes ASA since that time and has taken anticoagulation in perioperative periods. Workup with MRI and LP studies were apparently also indicative for MS. She was treated with multiple rounds of steroids for months at a time and felt a lot of mood disturbance from these. She was never started on other immunomodulatory or immunosuppressive treatments.   Review of Systems  Constitutional:  Positive for fatigue.  HENT:  Positive for mouth sores and mouth dryness.   Eyes:  Negative for dryness.  Respiratory:  Negative for shortness of breath.   Cardiovascular:  Negative for chest pain and palpitations.  Gastrointestinal:  Negative for blood in stool, constipation and diarrhea.  Endocrine: Positive  for increased urination.  Genitourinary:  Negative for involuntary urination.  Musculoskeletal:  Positive for joint pain, joint pain, joint swelling, myalgias, morning stiffness, muscle tenderness and myalgias. Negative for gait problem and muscle weakness.  Skin:  Positive for color change, rash and sensitivity to sunlight. Negative for hair loss.  Allergic/Immunologic: Positive for susceptible to infections.  Neurological:  Negative for dizziness and headaches.  Hematological:  Negative for swollen glands.  Psychiatric/Behavioral:  Positive for depressed mood and sleep disturbance. The patient is nervous/anxious.     PMFS History:  Patient Active Problem List   Diagnosis Date Noted   Localized  swelling of right lower leg 11/01/2022   Chronic venous insufficiency of lower extremity 11/01/2022   Hyperglycemia 05/23/2022   Obesity without serious comorbidity 05/23/2022   Rash and other nonspecific skin eruption 04/06/2022   Leukocytosis 03/06/2022   Redness 11/14/2021   Blurry vision 11/07/2021   Hot flash not due to menopause 11/04/2021   S/P laparoscopic hysterectomy 10/31/2021   Urinary urgency 09/09/2021   Itching 09/09/2021   High risk medication use 07/25/2021   Finger joint swelling, left 04/01/2021   Systemic lupus erythematosus (HCC) 04/01/2021   Fibromyalgia 04/01/2021   Alopecia 11/17/2020   Other fatigue 10/12/2020   Migraine without aura    History of DVT (deep vein thrombosis)    History of endometriosis    Right-sided muscle weakness 05/15/2019   Abnormal finding on MRI of brain 05/15/2019   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   Tremor 12/24/2017    Past Medical History:  Diagnosis Date   aub    Bruises easily 10/24/2021   Complication of anesthesia 10/15/2020   hypotension   Fibromyalgia    GERD (gastroesophageal reflux disease)    Heart murmur  PFO    off 81 mg aspirin since feb 2022 per pcp  no cardiologist   Hemangioma    History of DVT (deep vein  thrombosis)    right le 7 yrs ago, right leg slight swollen   History of endometriosis    History of hiatal hernia    Hypotension    with 10-15-2020 endoscopy   Long-term corticosteroid use 02/20/2022   Corticosteroid   Migraine without aura    Systemic lupus erythematosus (HCC)    TIA    ages 63 and 88   Vascular insufficiency    Viral meningitis    age 85    Family History  Problem Relation Age of Onset   Alcohol abuse Mother    Cancer Father        Lung and Colon   Heart attack Father    COPD Father    Breast cancer Paternal Aunt        77's   Ovarian cancer Paternal Aunt        68's   Diabetes Maternal Grandmother    Cervical cancer Maternal Grandmother    Fibromyalgia Maternal Grandmother    Breast cancer Maternal Grandmother        ? age of dx   Prostate cancer Maternal Grandfather    Heart attack Maternal Grandfather    ALS Paternal Grandmother    Prostate cancer Paternal Grandfather    Heart attack Paternal Grandfather    Esophageal cancer Paternal Grandfather    Breast cancer Other    Colon cancer Neg Hx    Stomach cancer Neg Hx    Rectal cancer Neg Hx    Past Surgical History:  Procedure Laterality Date   ABDOMINAL HYSTERECTOMY     ANTERIOR CRUCIATE LIGAMENT REPAIR Left 10/2018   CYSTOSCOPY  10/31/2021   Procedure: CYSTOSCOPY;  Surgeon: Romualdo Bolk, MD;  Location: [facility];  Service: Gynecology;;   PELVIC LAPAROSCOPY     for endometriosis yrs ago per pt on 10-24-2021   right knee lateral release and meniscal repair  2011   TONSILLECTOMY     age 54, adenoids removed also   TOTAL LAPAROSCOPIC HYSTERECTOMY WITH SALPINGECTOMY Bilateral 10/31/2021   Procedure: TOTAL LAPAROSCOPIC HYSTERECTOMY WITH SALPINGECTOMY;  Surgeon: Romualdo Bolk, MD;  Location: [facility];  Service: Gynecology;  Laterality: Bilateral;  UMBILICAL HERNIA REPAIR     yrs ago   UPPER GI ENDOSCOPY     with esophagus stretching  10-15-2020, 03-11-2020, 05-09-2019,  2013 with being polyp removed   Social History   Social History Narrative   Not on file   Immunization History  Administered Date(s) Administered   PFIZER(Purple Top)SARS-COV-2 Vaccination 06/27/2020, 07/28/2020   PPD Test 01/23/2019     Objective: Vital Signs: BP 109/75 (BP Location: Left Arm, Patient Position: Sitting, Cuff Size: Normal)   Pulse 86   Resp 14   Ht 5\' 7"  (1.702 m)   Wt 168 lb (76.2 kg)   LMP 10/10/2021   BMI 26.31 kg/m    Physical Exam HENT:     Mouth/Throat:     Mouth: Mucous membranes are moist.     Pharynx: Oropharynx is clear.  Eyes:     Conjunctiva/sclera: Conjunctivae normal.  Cardiovascular:     Rate and Rhythm: Normal rate and regular rhythm.  Pulmonary:     Effort: Pulmonary effort is normal.     Breath sounds: Normal breath sounds.  Musculoskeletal:     Comments: There is mostly nonpitting swelling at lower legs bilaterally, worse on right side  Lymphadenopathy:     Cervical: No cervical adenopathy.  Skin:    General: Skin is warm and dry.     Findings: Rash present.  Neurological:     Mental Status: She is alert.  Psychiatric:        Mood and Affect: Mood normal.      Musculoskeletal Exam:  Elbows full ROM no tenderness or swelling Wrists full ROM no tenderness or swelling Fingers full ROM, tenderness to pressure on several finger joints, erythema on dorsum of MCPs and along margins at DIP joints, early Heberden's nodes or mild swelling present at several DIPs Hip normal internal and external rotation without pain, no tenderness to lateral hip palpation Knees full ROM no tenderness or swelling Ankles full ROM no tenderness or swelling   Investigation: No additional findings.  Imaging: No results found.  Recent Labs: Lab Results  Component Value Date   WBC 5.7 05/15/2023   HGB 13.6 05/15/2023   PLT 350 05/15/2023   NA 138 05/15/2023   K 4.5 05/15/2023   CL 104 05/15/2023   CO2 26  05/15/2023   GLUCOSE 84 05/15/2023   BUN 19 05/15/2023   CREATININE 0.90 05/15/2023   BILITOT 0.5 05/15/2023   ALKPHOS 47 10/26/2021   AST 23 05/15/2023   ALT 13 05/15/2023   PROT 6.8 05/15/2023   ALBUMIN 4.9 10/26/2021   CALCIUM 9.6 05/15/2023   GFRAA >60 03/10/2020   QFTBGOLDPLUS NEGATIVE 05/15/2023    Speciality Comments: PLQ Eye Exam [facility] 07/03/2022 WNL Follow-up: 12 months   Procedures:  No procedures performed Allergies: Cephalexin, Penicillins, Amitiza [lubiprostone], and Sulfa antibiotics   Assessment / Plan:     Visit Diagnoses: Systemic lupus erythematosus, unspecified SLE type, unspecified organ involvement status (HCC) - Plan: Sedimentation rate, Anti-DNA antibody, double-stranded  SLE with arthritis, rash, mucosal ulcers and lymphadenopathy with positive double-stranded DNA overall and low disease activity but many bothersome symptoms.  Checking sedimentation rate and dsDNA antibody for disease activity monitoring.  Plan to continue on hydroxychloroquine 400 mg daily and Saphnelo 300 mg IV monthly.  High risk medication use - hydroxychloroquine 400 mg daily and Saphnelo 300 mg IV monthly. PLQ Eye Exam [facility] 07/03/2022 WNL - Plan: CBC with Differential/Platelet, COMPLETE METABOLIC PANEL WITH GFR  Checking CBC and  CMP for medication monitoring on continued long-term use of hydroxychloroquine and Saphnelo.  Tolerating infusions fine without reaction.  No serious interval infections keeps upper respiratory symptoms more allergy related.  Last hydroxychloroquine eye exam reviewed from August 2023 would be due for repeat for this year.  Rash and other nonspecific skin eruption  Itching - Oral antihistamines and topical emollients with partial benefit.  Recent dermatology evaluation sounds more concerning for neurodermatitis process versus any cutaneous lupus.  That be consistent with lack of response to topical steroids and variety of  DMARD.  Agree with the recommended referral to neurology.  I think she may also be appropriate for skin biopsy for small fiber neuropathy if no clear diagnosis from other testing.  Chronic venous insufficiency of lower extremity  Pitting edema is minimal today though keeps some chronic persistent swelling.  No overlying erythema or skin discoloration.  Continuing use of as needed compression socks and Lasix.   Orders: Orders Placed This Encounter  Procedures   CBC with Differential/Platelet   COMPLETE METABOLIC PANEL WITH GFR   Sedimentation rate   Anti-DNA antibody, double-stranded   No orders of the defined types were placed in this encounter.    Follow-Up Instructions: Return in about 3 months (around 11/07/2023) for SLE on saphnelo/HCQ ?SFN.   Fuller Plan, MD  Note - This record has been created using [employer].  Chart creation errors have been sought, but may not always  have been located. Such creation errors do not reflect on  the standard of medical care.

## 2023-07-26 ENCOUNTER — Telehealth: Payer: Self-pay | Admitting: Pharmacist

## 2023-07-26 ENCOUNTER — Encounter: Payer: Self-pay | Admitting: Internal Medicine

## 2023-07-26 NOTE — Telephone Encounter (Signed)
Received fax from [facility] regarding SAPHNELO infusion that patient received on 07/25/23. Dose: 300mg  Labs were not drawn.  Patient tolerated infusion without complications.  Changes/concerns since last visit: none  Next SAPNHELO infusion scheduled for 08/22/23  Chesley Mires, PharmD, MPH, BCPS, CPP Clinical Pharmacist (Rheumatology and Pulmonology)

## 2023-07-27 NOTE — Telephone Encounter (Signed)
I have filled out the physician portion of a form for her based on lupus. She will need to get it and complete the patient information parts. Thanks.

## 2023-08-08 ENCOUNTER — Ambulatory Visit: Payer: BC Managed Care – PPO | Attending: Internal Medicine | Admitting: Internal Medicine

## 2023-08-08 ENCOUNTER — Encounter: Payer: Self-pay | Admitting: Internal Medicine

## 2023-08-08 VITALS — BP 109/75 | HR 86 | Resp 14 | Ht 67.0 in | Wt 168.0 lb

## 2023-08-08 DIAGNOSIS — L659 Nonscarring hair loss, unspecified: Secondary | ICD-10-CM | POA: Diagnosis not present

## 2023-08-08 DIAGNOSIS — Z79899 Other long term (current) drug therapy: Secondary | ICD-10-CM

## 2023-08-08 DIAGNOSIS — R21 Rash and other nonspecific skin eruption: Secondary | ICD-10-CM | POA: Diagnosis not present

## 2023-08-08 DIAGNOSIS — M329 Systemic lupus erythematosus, unspecified: Secondary | ICD-10-CM

## 2023-08-08 DIAGNOSIS — L299 Pruritus, unspecified: Secondary | ICD-10-CM

## 2023-08-08 DIAGNOSIS — I872 Venous insufficiency (chronic) (peripheral): Secondary | ICD-10-CM

## 2023-08-09 LAB — ANTI-DNA ANTIBODY, DOUBLE-STRANDED: ds DNA Ab: 14 [IU]/mL — ABNORMAL HIGH

## 2023-08-09 LAB — COMPLETE METABOLIC PANEL WITH GFR
AG Ratio: 1.7 (calc) (ref 1.0–2.5)
ALT: 12 U/L (ref 6–29)
AST: 18 U/L (ref 10–30)
Albumin: 4.4 g/dL (ref 3.6–5.1)
Alkaline phosphatase (APISO): 62 U/L (ref 31–125)
BUN: 13 mg/dL (ref 7–25)
CO2: 27 mmol/L (ref 20–32)
Calcium: 9.4 mg/dL (ref 8.6–10.2)
Chloride: 104 mmol/L (ref 98–110)
Creat: 0.93 mg/dL (ref 0.50–0.97)
Globulin: 2.6 g/dL (ref 1.9–3.7)
Glucose, Bld: 78 mg/dL (ref 65–99)
Potassium: 4.2 mmol/L (ref 3.5–5.3)
Sodium: 139 mmol/L (ref 135–146)
Total Bilirubin: 0.5 mg/dL (ref 0.2–1.2)
Total Protein: 7 g/dL (ref 6.1–8.1)
eGFR: 80 mL/min/{1.73_m2} (ref >=60)

## 2023-08-09 LAB — CBC WITH DIFFERENTIAL/PLATELET
Absolute Monocytes: 502 {cells}/uL (ref 200–950)
Basophils Absolute: 50 {cells}/uL (ref 0–200)
Basophils Relative: 0.8 %
Eosinophils Absolute: 161 {cells}/uL (ref 15–500)
Eosinophils Relative: 2.6 %
HCT: 40.9 % (ref 35.0–45.0)
Hemoglobin: 13.7 g/dL (ref 11.7–15.5)
Lymphs Abs: 1736 {cells}/uL (ref 850–3900)
MCH: 30 pg (ref 27.0–33.0)
MCHC: 33.5 g/dL (ref 32.0–36.0)
MCV: 89.5 fL (ref 80.0–100.0)
MPV: 9.6 fL (ref 7.5–12.5)
Monocytes Relative: 8.1 %
Neutro Abs: 3751 {cells}/uL (ref 1500–7800)
Neutrophils Relative %: 60.5 %
Platelets: 272 10*3/uL (ref 140–400)
RBC: 4.57 10*6/uL (ref 3.80–5.10)
RDW: 12.7 % (ref 11.0–15.0)
Total Lymphocyte: 28 %
WBC: 6.2 10*3/uL (ref 3.8–10.8)

## 2023-08-09 LAB — SEDIMENTATION RATE: Sed Rate: 2 mm/h (ref 0–20)

## 2023-08-09 NOTE — Progress Notes (Signed)
Lab results are stable normal sedimentation rate, blood count, and metabolic panel. dsDNA remains low positive of 14 from 12 on the last test. No problems for continuing hydroxychloroquine and saphnelo.

## 2023-08-20 DIAGNOSIS — M329 Systemic lupus erythematosus, unspecified: Secondary | ICD-10-CM | POA: Diagnosis not present

## 2023-08-21 ENCOUNTER — Telehealth: Payer: Self-pay | Admitting: Pharmacist

## 2023-08-21 NOTE — Telephone Encounter (Signed)
Received fax from [facility] regarding Saphnelo IV [phone]) infusion that patient received on 08/20/23. Dose: 300mg  Labs were not drawn.  Patient tolerated infusion without complications.  Changes/concerns since last visit:   Next [facility] infusion scheduled for 09/17/23  Chesley Mires, PharmD, MPH, BCPS, CPP Clinical Pharmacist (Rheumatology and Pulmonology)

## 2023-08-22 ENCOUNTER — Other Ambulatory Visit: Payer: Self-pay | Admitting: Internal Medicine

## 2023-08-22 NOTE — Telephone Encounter (Signed)
Last Fill: 05/17/2023  Next Visit: 11/07/2023  Last Visit: 08/08/2023  Dx: not mentioned  Current Dose per office note on 08/08/2023: not mentioned  Okay to refill Flexeril?

## 2023-08-30 DIAGNOSIS — R9082 White matter disease, unspecified: Secondary | ICD-10-CM | POA: Diagnosis not present

## 2023-09-14 ENCOUNTER — Telehealth: Payer: Self-pay | Admitting: Pharmacist

## 2023-09-14 DIAGNOSIS — M329 Systemic lupus erythematosus, unspecified: Secondary | ICD-10-CM | POA: Diagnosis not present

## 2023-09-14 NOTE — Telephone Encounter (Signed)
Received fax from [facility] regarding Saphnelo IV [phone]) infusion that patient received on 09/14/23. Dose: 300mg  Labs were not drawn.  Patient tolerated infusion without complications.  Changes/concerns since last visit:   Next [facility] infusion scheduled for 10/15/2023  Chesley Mires, PharmD, MPH, BCPS, CPP Clinical Pharmacist (Rheumatology and Pulmonology)

## 2023-10-01 DIAGNOSIS — R9082 White matter disease, unspecified: Secondary | ICD-10-CM | POA: Diagnosis not present

## 2023-10-13 ENCOUNTER — Other Ambulatory Visit: Payer: Self-pay | Admitting: Internal Medicine

## 2023-10-13 DIAGNOSIS — L299 Pruritus, unspecified: Secondary | ICD-10-CM

## 2023-10-15 DIAGNOSIS — M329 Systemic lupus erythematosus, unspecified: Secondary | ICD-10-CM | POA: Diagnosis not present

## 2023-10-15 NOTE — Telephone Encounter (Signed)
Last Fill: 03/29/2023  Next Visit: 11/07/2023  Last Visit: 08/08/2023  Dx: Itching   Current Dose per office note on 08/08/2023: not mentioned  Okay to refill Hydroxyzine?

## 2023-10-16 ENCOUNTER — Telehealth: Payer: Self-pay | Admitting: Pharmacist

## 2023-10-16 NOTE — Telephone Encounter (Signed)
Received fax from [facility] regarding Saphnelo IV [phone]) infusion that patient received on 10/15/23. Dose: 300mg  Labs were not drawn.  Patient tolerated infusion without complications.  Changes/concerns since last visit: none   Next Saphnelo infusion scheduled for 11/12/23  Chesley Mires, PharmD, MPH, BCPS, CPP Clinical Pharmacist (Rheumatology and Pulmonology)

## 2023-10-24 NOTE — Progress Notes (Signed)
Office Visit Note  Patient: Christy Lowe             Date of Birth: Jul 10, 1981           MRN: [id number]             PCP: Georgina Quint, MD Referring: Georgina Quint, * Visit Date: 11/07/2023   Subjective:  Follow-up (Patient states she saw a neurologist and has some labs she needs drawn for that doctor. )   Discussed the use of AI scribe software for clinical note transcription with the patient, who gave verbal consent to proceed.  History of Present Illness   Christy Lowe is a 42 y.o. female here for follow up for systemic lupus on hydroxychloroquine 400 mg daily, Saphnelo 300 mg IV monthly.  She presents with persistent itching and the development of water-filled blisters on the face, neck, and chest. The itching is described as severe, with the patient noting that it feels like they want to "scratch their skin off." The itching and blistering are temporarily relieved following their regular infusions, but symptoms return in the week leading up to the next scheduled infusion.  The patient also reports cold, tingling sensations in their toes, and severe heel pain when in contact with the bed. They note that these symptoms also improve following their infusions but return as the effects of the infusion wear off.  In addition to these symptoms, the patient has been experiencing joint stiffness, which also worsens in the week leading up to their infusion. They have a history of transient ischemic attacks (TIAs), but have not experienced one recently.  The patient has been on a regimen of gabapentin and hydroxyzine to manage their symptoms. They report that the gabapentin has been helpful in managing the itching, and they continue to take hydroxyzine at night to prevent severe itching episodes.  The patient has a history of high D-dimer levels and has been advised to take a daily aspirin. They also have hemangiomas throughout their body on imaging. Despite these  findings, extensive testing has not revealed any blood clots.  The patient's symptoms and their trajectory have led to multiple diagnoses, including MS and lupus. However, the patient expresses frustration with the lack of a definitive diagnosis and treatment plan. They are seeking a solution to alleviate their persistent itching and blistering, as well as their other symptoms. She was recommended to have screening for antiphospholipid syndrome per neurologist, who otherwise found age advanced degenerative change on MRI but no inflammatory lesions.   Previous HPI 08/08/2023 [name] is a 42 y.o. female here for follow up for systemic lupus on hydroxychloroquine 400 mg daily, Saphnelo 300 mg IV monthly.  Last treatment infusion was on August 28 still noticing a improvement immediately afterwards but with some decline currently has active symptoms again only 2 weeks later.  She did have a recent work trip to [place].  Experienced some increased leg swelling after plane flight despite use of compressive sock and taking Lasix.  Still has the active rash with itching and bumps that appear fluid-filled and subsequently unroofed by scratching along the jawline and sides of the neck.  Has experienced a few more painful ulcers most often on the roof of the mouth.   She saw Dr. Reche Dixon for evaluation August 20. He did not see evidence consistent for active cutaneous lupus.  Recommended neurology consultation for suspected MS. previously saw local neurology with negative antibody testing and normal nerve conduction  study had not recommended any continued treatment there was treated for several years previously.  Current appointment for this now scheduled in January.   Previous HPI 05/15/2023 [name] is a 42 y.o. female here for follow up for systemic lupus on hydroxychloroquine 400 mg daily, Saphnelo 300 mg IV monthly.  No new severe disease flareup.  She is seeing definite benefit with her  infusions but notices some waning efficacy especially in the last week between dosing this was noticeable she thinks the last 4 treatments.  Also having issue with persistent skin problems legs are very itchy without visible rashes.  She is trying multiple medications for this including Allegra and the hydroxyzine with neurologist partially helpful.  Had a recent sinus infection but with prolonged course had x-ray concerning for left upper lung infiltrate so was treated with a Z-Pak.  No other infections or antibiotics since her last visit.   Previous HPI 02/19/23 [name] is a 41 y.o. female here for follow up for SLE on hydroxychloroquine 400 mg daily and Saphnelo 300 mg IV monthly.  Symptoms with arthritis, rashes, mucosal ulcers, pedal edema.  Since her last visit is recently started to do a lot worse with biggest complaint of increased swelling.  She has been noticing discoloration and puffiness especially in the fingers on both hands.  Also somewhat more leg swelling.  She has tried taking the Lasix and not seeing a large increase in your output nor any improvement in the leg swelling.  She has been having what feels like UTIs about monthly.  Predominantly urinary urgency and frequency no other associated symptoms.  Skin rashes have been ongoing around the neck and jaw area about the same as before.  Does not see discrete joint swelling so much as the overall puffiness but does have increased pain and stiffness in both hands. Sometimes going numb and particularly has trouble gripping tightly cannot seem to exert a normal amount of force.    Previous HPI 11/07/22 [name] is a 42 y.o. female here for follow up for SLE on HCQ 400 mg daily and saphnelo 300 mg IV monthly. Symptoms including arthritis, rashes, mucosal ulcers and pedal edema.  She is noticing a good benefit with the Saphnelo infusions but describes pain in her joints and generalized sensitivity gets worse again by at least 1  week before each next treatment is due.  Noticing a few new areas of swelling of her hands and skin discoloration on her hands the rashes around her head and neck are about the same.  She had recent evaluation at [facility] with no superficial veins amenable to procedure intervention identified.  The swelling is becoming faster to develop and somewhat more extensive she notices mild discoloration around the lower shin and ankle described a bluish hue when very swollen.  She quit her job due to these problems knowing that the stress of working continuous 12-hour shifts was worsening symptoms and having to spend some many months in [place] or travel throughout the country was very problematic for maintaining medication access.   Previous HPI 08/09/22 Ferdie Ping Sissel is a 42 y.o. female here for follow up for lupus on HCQ 400 mg daily and saphnelo 300 mg IV monthly.  Her joint symptoms have remained pretty well controlled after completely tapering off the prednisone.  She is noticing a difference in symptoms after the Saphnelo infusion versus 4 weeks later proceeding her next dose.  Continues to have very little appetite but  not seeing any weight reduction.  Skin itching especially on her upper legs is a persistent issue she is using topical emollients with partial benefit.  More recently she is also experiencing an increase in urinary frequency both daytime and increased nocturia.  No visible blood in urine or dysuria.  She is currently anticipating an upcoming plastic surgery needing adjustments for perioperative drug management.   Previous HPI 05/01/2022 Ferdie Ping Dancer is a 42 y.o. female here for follow up for lupus on HCQ 400 mg daily started saphnelo infusion and prednisone tapering down to 2.5 mg daily now. So far she is noticing some improvement with overall severity of skin redness swelling and itching but definitely not resolved.  Face and chest continue having the most erythema.  She has noticed some  bruising without any specific cause.  She is noticing some swelling or otherwise thickening in the joints of her right hand and around the right wrist.   Previous HPI 04/06/2022 Ferdie Ping Rippeon is a 42 y.o. female here for follow up for lupus on HCQ 400 mg daily started saphnelo infusion last month and prednisone tapering down to 2.5 mg daily now. She felt a large improvement after her first treatment with decreased joint pains and decreased abdominal pain. She is tapering off the prednisone without severe problem. She has noticed oral ulcers on the top of her mouth for several days and is having a rash with small papules on both sides of her neck and lower face. Appetite is decreased but not losing any weight. She feels a lot of stress after dealing with work incident.   Previous HPI 03/06/2022 Ferdie Ping Pinkstaff is a 42 y.o. female here for follow up for lupus on HCQ 400 mg daily and prednisone tapering down to 10 mg daily after increase to 30 last month from flare up with mouth ulcers and diffuse rashes. She had several complications with high dose prednisone increased weight, mood irritability, and wound dehiscence af hysterectomy cuff site. We discussed plan to start saphenlo infusion as a replacement for benlysta treatment which did not improve symptoms or lab abnormalities much after about 6 months treatment.  She has some improvement in the mouth and lip ulcers she is not sure whether starting the valacyclovir about 4 days ago has had significant effect.  She completed the course of oral antibiotics abdominal pain is partially better. She is scheduled for first saphnelo infusion Thursday and has OBGYN followup later this week.     Previous HPI 12/13/21 Ferdie Ping Corigliano is a 42 y.o. female here for follow up for systemic lupus with ongoing joint pains and skin rash on HCQ 400 mg daily, Benlysta 200 mg [place] weekly, and prednisone 10 mg daily. Since our last visit she went for hysterectomy this was  uneventful and has recovered well. She was off the benlysta for about 1 month in total around the surgery. She has not felt much improvement so far still having a lot of fatigue, skin rash and flushing, joint pain in several areas with left wrist swelling. Headaches are about the same as before but now having recurrent episodes of vertigo. She is now taking hydroxyzine twice on some days due to persistent itching, and feels excessively hot most of the time. She Is back to taking 10 mg of prednisone when trying to drop this to 5 mg felt extreme fatigue and back pain that felt like bruising and stiffness all over her body.   Previous HPI 11/17/20 Kyelle C.  Lindor is a 42 y.o. female here for evaluation of positive ANA. She is noticing multiple symptoms including generalized alopecia, oral dryness, left sided neck swelling, joint pain and stiffness, position tremor, and episodic skin rashes. She has had multiple ongoing problems since many years ago. She was hospitalized for viral meningitis at age 42 and feels she has been susceptible to infections ever since this time or before, with numerous cases of bronchitis and has had some lung nodules on CT imaging during these repeat episodes. About 7 years ago she suffered from a RLE DVT provoked after right knee arthroscopy and developed right sided face and arm numbness. Workup indicated she had a TIA also noted to have PFO. She takes ASA since that time and has taken anticoagulation in perioperative periods. Workup with MRI and LP studies were apparently also indicative for MS. She was treated with multiple rounds of steroids for months at a time and felt a lot of mood disturbance from these. She was never started on other immunomodulatory or immunosuppressive treatments.   Review of Systems  Constitutional:  Positive for fatigue.  HENT:  Positive for mouth sores and mouth dryness.   Eyes:  Negative for dryness.  Respiratory:  Negative for shortness of  breath.   Cardiovascular:  Negative for chest pain and palpitations.  Gastrointestinal:  Negative for blood in stool, constipation and diarrhea.  Endocrine: Positive for increased urination.  Genitourinary:  Negative for involuntary urination.  Musculoskeletal:  Positive for joint pain, joint pain, joint swelling, myalgias, morning stiffness and myalgias. Negative for gait problem, muscle weakness and muscle tenderness.  Skin:  Positive for rash and sensitivity to sunlight. Negative for color change and hair loss.  Allergic/Immunologic: Positive for susceptible to infections.  Neurological:  Negative for dizziness and headaches.  Hematological:  Negative for swollen glands.  Psychiatric/Behavioral:  Negative for depressed mood and sleep disturbance. The patient is nervous/anxious.     PMFS History:  Patient Active Problem List   Diagnosis Date Noted   Vitamin D deficiency 11/07/2023   Localized swelling of right lower leg 11/01/2022   Chronic venous insufficiency of lower extremity 11/01/2022   Hyperglycemia 05/23/2022   Obesity without serious comorbidity 05/23/2022   Rash and other nonspecific skin eruption 04/06/2022   Leukocytosis 03/06/2022   Redness 11/14/2021   Blurry vision 11/07/2021   Hot flash not due to menopause 11/04/2021   S/P laparoscopic hysterectomy 10/31/2021   Urinary urgency 09/09/2021   Itching 09/09/2021   High risk medication use 07/25/2021   Finger joint swelling, left 04/01/2021   Systemic lupus erythematosus (HCC) 04/01/2021   Fibromyalgia 04/01/2021   Alopecia 11/17/2020   Other fatigue 10/12/2020   Migraine without aura    History of DVT (deep vein thrombosis)    History of endometriosis    Right-sided muscle weakness 05/15/2019   Abnormal finding on MRI of brain 05/15/2019   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   Tremor 12/24/2017    Past Medical History:  Diagnosis Date   aub    Bruises easily 10/24/2021   Complication of anesthesia  10/15/2020   hypotension   Fibromyalgia    GERD (gastroesophageal reflux disease)    Heart murmur  PFO    off 81 mg aspirin since feb 2022 per pcp  no cardiologist   Hemangioma    History of DVT (deep vein thrombosis)    right le 7 yrs ago, right leg slight swollen   History of endometriosis    History of hiatal  hernia    Hypotension    with 10-15-2020 endoscopy   Long-term corticosteroid use 02/20/2022   Corticosteroid   Migraine without aura    Systemic lupus erythematosus (HCC)    TIA    ages 64 and 78   Vascular insufficiency    Viral meningitis    age 79    Family History  Problem Relation Age of Onset   Alcohol abuse Mother    Cancer Father        Lung and Colon   Heart attack Father    COPD Father    Breast cancer Paternal Aunt        77's   Ovarian cancer Paternal Aunt        31's   Diabetes Maternal Grandmother    Cervical cancer Maternal Grandmother    Fibromyalgia Maternal Grandmother    Breast cancer Maternal Grandmother        ? age of dx   Prostate cancer Maternal Grandfather    Heart attack Maternal Grandfather    ALS Paternal Grandmother    Prostate cancer Paternal Grandfather    Heart attack Paternal Grandfather    Esophageal cancer Paternal Grandfather    Breast cancer Other    Colon cancer Neg Hx    Stomach cancer Neg Hx    Rectal cancer Neg Hx    Past Surgical History:  Procedure Laterality Date   ABDOMINAL HYSTERECTOMY     ANTERIOR CRUCIATE LIGAMENT REPAIR Left 10/2018   CYSTOSCOPY  10/31/2021   Procedure: CYSTOSCOPY;  Surgeon: Romualdo Bolk, MD;  Location: [facility];  Service: Gynecology;;   PELVIC LAPAROSCOPY     for endometriosis yrs ago per pt on 10-24-2021   right knee lateral release and meniscal repair  2011   TONSILLECTOMY     age 87, adenoids removed also   TOTAL LAPAROSCOPIC HYSTERECTOMY WITH SALPINGECTOMY Bilateral 10/31/2021   Procedure: TOTAL LAPAROSCOPIC HYSTERECTOMY WITH SALPINGECTOMY;  Surgeon:  Romualdo Bolk, MD;  Location: [facility];  Service: Gynecology;  Laterality: Bilateral;   UMBILICAL HERNIA REPAIR     yrs ago   UPPER GI ENDOSCOPY     with esophagus stretching 10-15-2020, 03-11-2020, 05-09-2019,  2013 with being polyp removed   Social History   Social History Narrative   Not on file   Immunization History  Administered Date(s) Administered   PFIZER(Purple Top)SARS-COV-2 Vaccination 06/27/2020, 07/28/2020   PPD Test 01/23/2019     Objective: Vital Signs: BP 110/74 (BP Location: Left Arm, Patient Position: Sitting, Cuff Size: Normal)   Pulse 80   Resp 14   Ht 5\' 7"  (1.702 m)   Wt 168 lb (76.2 kg)   LMP 10/10/2021   BMI 26.31 kg/m    Physical Exam Eyes:     Conjunctiva/sclera: Conjunctivae normal.  Cardiovascular:     Rate and Rhythm: Normal rate and regular rhythm.  Pulmonary:     Effort: Pulmonary effort is normal.     Breath sounds: Normal breath sounds.  Lymphadenopathy:     Cervical: No cervical adenopathy.  Skin:    General: Skin is warm and dry.     Findings: Rash present.     Comments: Mostly excoriated papules along jawline and side of neck  Neurological:     Mental Status: She is alert.  Psychiatric:        Mood and Affect: Mood normal.      Musculoskeletal Exam:  Shoulders full ROM no tenderness or swelling Elbows full  ROM no tenderness or swelling Wrists full ROM no tenderness or swelling Fingers full ROM no tenderness or swelling Knees full ROM no tenderness or swelling   Investigation: No additional findings.  Imaging: No results found.  Recent Labs: Lab Results  Component Value Date   WBC 5.1 11/07/2023   HGB 13.5 11/07/2023   PLT 247 11/07/2023   NA 138 11/07/2023   K 4.4 11/07/2023   CL 104 11/07/2023   CO2 26 11/07/2023   GLUCOSE 63 (L) 11/07/2023   BUN 15 11/07/2023   CREATININE 0.86 11/07/2023   BILITOT 0.3 11/07/2023   ALKPHOS 47 10/26/2021   AST 22 11/07/2023   ALT 16 11/07/2023    PROT 6.7 11/07/2023   ALBUMIN 4.9 10/26/2021   CALCIUM 9.5 11/07/2023   GFRAA >60 03/10/2020   QFTBGOLDPLUS NEGATIVE 05/15/2023    Speciality Comments: PLQ Eye Exam [facility] 07/03/2022 WNL Follow-up: 12 months Appt 12/01/2023 per patient   Procedures:  No procedures performed Allergies: Cephalexin, Penicillins, Amitiza [lubiprostone], and Sulfa antibiotics   Assessment / Plan:     Visit Diagnoses: Systemic lupus erythematosus, unspecified SLE type, unspecified organ involvement status (HCC) Varyign symptoms between infusion timing, no severe clinical signs today. Checking labs as regular as well as APS screening per neuro recs. -Plan to continue on hydroxychloroquine 400 mg daily and Saphnelo 300 mg IV monthly -ACA, B2GP1, LA Abs check -ESR, CRP, dsDNA  High risk medication use - Plan to continue on hydroxychloroquine 400 mg daily and Saphnelo 300 mg IV monthly. PLQ Eye Exam  07/03/2022 WNL. Needs updated PLQ eye exam. No infusion reaction, no serious interval infections. Has eye appt for January. -Checking CBC and cMP for medication monitoring  Rash and other nonspecific skin eruption - May also be appropriate for skin biopsy for small fiber neuropathy if no clear diagnosis from other testing. Severe itching and blistering, primarily on the face and neck, with some on the chest. Blisters are fluid-filled. Symptoms improve with gabapentin and hydroxyzine, and after infusions. Possible small fiber neuropathy causing neurodermatitis, or less consistent cutaneous lupus with bullous rashes. -Continue gabapentin and hydroxyzine. -Order blood work to check for antiphospholipid antibodies. -Consider alternative treatments for nerve irritation if tests are negative. -Consider treatments for inflammation, such as dapsone, if tests are positive.  Microvascular Disease White matter changes on MRI suggestive of microvascular disease, not typical for patient's age. Possible  accelerated microvascular disease in the brain, common in lupus. -No specific plan discussed.  Orders: Orders Placed This Encounter  Procedures   Sedimentation rate   C-reactive protein   Anti-DNA antibody, double-stranded   Beta-2 glycoprotein antibodies   Cardiolipin antibodies, IgG, IgM, IgA   Lupus Anticoagulant Eval w/Reflex   D-dimer, quantitative   Vitamin B12   VITAMIN D 25 Hydroxy (Vit-D Deficiency, Fractures)   CBC with Differential/Platelet   COMPLETE METABOLIC PANEL WITH GFR   No orders of the defined types were placed in this encounter.    Follow-Up Instructions: Return in about 3 months (around 02/05/2024) for SLE on HCQ/SAF f/u 3mos.   Fuller Plan, MD  Note - This record has been created using [employer].  Chart creation errors have been sought, but may not always  have been located. Such creation errors do not reflect on  the standard of medical care.

## 2023-10-26 ENCOUNTER — Other Ambulatory Visit: Payer: Self-pay | Admitting: Internal Medicine

## 2023-10-26 DIAGNOSIS — M359 Systemic involvement of connective tissue, unspecified: Secondary | ICD-10-CM

## 2023-10-29 NOTE — Telephone Encounter (Signed)
Last Fill: 07/06/2023  Eye exam: 07/03/2022 Appt 12/01/2023 per patient  Labs: 08/08/2023  Lab results are stable normal sedimentation rate, blood count, and metabolic panel. dsDNA remains low positive of 14 from 12 on the last test. No problems for continuing hydroxychloroquine and saphnelo.   Next Visit: 11/07/2023  Last Visit: 08/08/2023  KG:[id number] lupus erythematosus, unspecified SLE type, unspecified organ involvement status   Current Dose per office note 08/08/2023: hydroxychloroquine 400 mg daily   Okay to refill Plaquenil?

## 2023-11-07 ENCOUNTER — Encounter: Payer: Self-pay | Admitting: Internal Medicine

## 2023-11-07 ENCOUNTER — Ambulatory Visit: Payer: BC Managed Care – PPO | Attending: Internal Medicine | Admitting: Internal Medicine

## 2023-11-07 VITALS — BP 110/74 | HR 80 | Resp 14 | Ht 67.0 in | Wt 168.0 lb

## 2023-11-07 DIAGNOSIS — I872 Venous insufficiency (chronic) (peripheral): Secondary | ICD-10-CM

## 2023-11-07 DIAGNOSIS — L299 Pruritus, unspecified: Secondary | ICD-10-CM | POA: Diagnosis not present

## 2023-11-07 DIAGNOSIS — R21 Rash and other nonspecific skin eruption: Secondary | ICD-10-CM

## 2023-11-07 DIAGNOSIS — E559 Vitamin D deficiency, unspecified: Secondary | ICD-10-CM

## 2023-11-07 DIAGNOSIS — M329 Systemic lupus erythematosus, unspecified: Secondary | ICD-10-CM

## 2023-11-07 DIAGNOSIS — Z79899 Other long term (current) drug therapy: Secondary | ICD-10-CM | POA: Diagnosis not present

## 2023-11-11 LAB — COMPLETE METABOLIC PANEL WITH GFR
AG Ratio: 2 (calc) (ref 1.0–2.5)
ALT: 16 U/L (ref 6–29)
AST: 22 U/L (ref 10–30)
Albumin: 4.5 g/dL (ref 3.6–5.1)
Alkaline phosphatase (APISO): 49 U/L (ref 31–125)
BUN: 15 mg/dL (ref 7–25)
CO2: 26 mmol/L (ref 20–32)
Calcium: 9.5 mg/dL (ref 8.6–10.2)
Chloride: 104 mmol/L (ref 98–110)
Creat: 0.86 mg/dL (ref 0.50–0.99)
Globulin: 2.2 g/dL (ref 1.9–3.7)
Glucose, Bld: 63 mg/dL — ABNORMAL LOW (ref 65–99)
Potassium: 4.4 mmol/L (ref 3.5–5.3)
Sodium: 138 mmol/L (ref 135–146)
Total Bilirubin: 0.3 mg/dL (ref 0.2–1.2)
Total Protein: 6.7 g/dL (ref 6.1–8.1)
eGFR: 88 mL/min/{1.73_m2} (ref >=60)

## 2023-11-11 LAB — CARDIOLIPIN ANTIBODIES, IGG, IGM, IGA
Anticardiolipin IgA: <2 [APL'U]/mL (ref <20.0)
Anticardiolipin IgG: <2 [GPL'U]/mL (ref <20.0)
Anticardiolipin IgM: <2 [MPL'U]/mL (ref <20.0)

## 2023-11-11 LAB — BETA-2 GLYCOPROTEIN ANTIBODIES
Beta-2 Glyco 1 IgA: <2 U/mL (ref <20.0)
Beta-2 Glyco 1 IgM: <2 U/mL (ref <20.0)
Beta-2 Glyco I IgG: <2 U/mL (ref <20.0)

## 2023-11-11 LAB — CBC WITH DIFFERENTIAL/PLATELET
Absolute Lymphocytes: 1601 {cells}/uL (ref 850–3900)
Absolute Monocytes: 403 {cells}/uL (ref 200–950)
Basophils Absolute: 31 {cells}/uL (ref 0–200)
Basophils Relative: 0.6 %
Eosinophils Absolute: 112 {cells}/uL (ref 15–500)
Eosinophils Relative: 2.2 %
HCT: 40.5 % (ref 35.0–45.0)
Hemoglobin: 13.5 g/dL (ref 11.7–15.5)
MCH: 29.9 pg (ref 27.0–33.0)
MCHC: 33.3 g/dL (ref 32.0–36.0)
MCV: 89.8 fL (ref 80.0–100.0)
MPV: 9.8 fL (ref 7.5–12.5)
Monocytes Relative: 7.9 %
Neutro Abs: 2953 {cells}/uL (ref 1500–7800)
Neutrophils Relative %: 57.9 %
Platelets: 247 10*3/uL (ref 140–400)
RBC: 4.51 10*6/uL (ref 3.80–5.10)
RDW: 12.3 % (ref 11.0–15.0)
Total Lymphocyte: 31.4 %
WBC: 5.1 10*3/uL (ref 3.8–10.8)

## 2023-11-11 LAB — LUPUS ANTICOAGULANT EVAL W/ REFLEX
PTT-LA Screen: 30 s (ref <=40)
dRVVT: 25 s (ref <=45)

## 2023-11-11 LAB — D-DIMER, QUANTITATIVE: D-Dimer, Quant: 0.27 ug{FEU}/mL (ref <0.50)

## 2023-11-11 LAB — VITAMIN B12: Vitamin B-12: 412 pg/mL (ref 200–1100)

## 2023-11-11 LAB — VITAMIN D 25 HYDROXY (VIT D DEFICIENCY, FRACTURES): Vit D, 25-Hydroxy: 35 ng/mL (ref 30–100)

## 2023-11-11 LAB — SEDIMENTATION RATE: Sed Rate: 6 mm/h (ref 0–20)

## 2023-11-11 LAB — C-REACTIVE PROTEIN: CRP: <3 mg/L (ref <8.0)

## 2023-11-11 LAB — ANTI-DNA ANTIBODY, DOUBLE-STRANDED: ds DNA Ab: 12 [IU]/mL — ABNORMAL HIGH

## 2023-11-12 ENCOUNTER — Telehealth: Payer: Self-pay | Admitting: Pharmacist

## 2023-11-12 NOTE — Telephone Encounter (Signed)
Received fax from [facility] regarding Saphnelo IV [phone]) infusion that patient received on 11/12/23. Dose: 300mg  Labs were not drawn.  Patient tolerated infusion without complications.  Changes/concerns since last visit:   Next Saphnelo infusion scheduled for 12/10/2023  Chesley Mires, PharmD, MPH, BCPS, CPP Clinical Pharmacist (Rheumatology and Pulmonology)

## 2023-11-30 ENCOUNTER — Telehealth: Payer: Self-pay | Admitting: Pharmacist

## 2023-11-30 NOTE — Telephone Encounter (Signed)
 Received fax from [facility] that patient's SAPHNELO  infusions are due for order renewal by 12/10/2023  Treatment plan below: Saphnelo  300mg  IV every 4 weeks Premeds: IV Benadryl  25mg  + acetaminophen  325mg  p.o.  Fax: [phone] Phone: [phone]  Sherry Pennant, PharmD, MPH, BCPS, CPP Clinical Pharmacist (Rheumatology and Pulmonology)

## 2023-12-07 ENCOUNTER — Telehealth: Payer: Self-pay | Admitting: *Deleted

## 2023-12-07 NOTE — Telephone Encounter (Signed)
 Called [employer] to submit pre-certification request for Saphnelo  [phone]).  Spoke with Mae, nurse - clinical questions completed over phone. Clinicals sent via fax. Patient is approved 12/07/2023 to 12/06/2024  Case # [id number] Phone: [phone] Fax: [phone]  Called patient - advised of approval. She is good to go with infusion  Eyob Godlewski, PharmD, MPH, BCPS, CPP Clinical Pharmacist (Rheumatology and Pulmonology)

## 2023-12-07 NOTE — Telephone Encounter (Signed)
 [facility] called patient stating PA has not been submitted with patient's new insurance for infusion on 12/10/2023. [facility] is stating they have not received patient's new insurance information and needs CPT codes. Please advise.

## 2023-12-07 NOTE — Telephone Encounter (Signed)
 Patient called, patient's insurance advised to submit PA as medical instead of pharmacy, call [phone], appt at [facility] on 12/10/2023 at 10:30 am for infusion.

## 2023-12-10 ENCOUNTER — Telehealth: Payer: Self-pay | Admitting: Pharmacist

## 2023-12-10 DIAGNOSIS — M329 Systemic lupus erythematosus, unspecified: Secondary | ICD-10-CM | POA: Diagnosis not present

## 2023-12-10 NOTE — Telephone Encounter (Signed)
 Received precertification approval letter from [facility] for [place]. Faxed to [facility] this morning    Chesley Mires, PharmD, MPH, BCPS, CPP Clinical Pharmacist (Rheumatology and Pulmonology)

## 2023-12-10 NOTE — Telephone Encounter (Signed)
 Received fax from [facility] regarding Saphnelo  IV [phone])  infusion that patient received on 12/10/23. Dose: 300mg  Labs were not drawn.  Patient tolerated infusion without complications.  Changes/concerns since last visit: none  Next Saphnelo  infusion scheduled for 01/07/2024  Sherry Pennant, PharmD, MPH, BCPS, CPP Clinical Pharmacist (Rheumatology and Pulmonology)

## 2023-12-12 ENCOUNTER — Other Ambulatory Visit: Payer: Self-pay | Admitting: Internal Medicine

## 2023-12-12 NOTE — Telephone Encounter (Signed)
 Last Fill: 08/22/2023  Next Visit: 02/05/2024  Last Visit: 11/07/2023  Dx: Systemic lupus erythematosus, unspecified SLE type, unspecified organ involvement status   Current Dose per office note on 11/07/2023: not discussed  Okay to refill Flexeril ?

## 2023-12-31 DIAGNOSIS — R202 Paresthesia of skin: Secondary | ICD-10-CM | POA: Diagnosis not present

## 2024-01-04 DIAGNOSIS — Z79899 Other long term (current) drug therapy: Secondary | ICD-10-CM | POA: Diagnosis not present

## 2024-01-07 ENCOUNTER — Telehealth: Payer: Self-pay | Admitting: Pharmacist

## 2024-01-07 DIAGNOSIS — M329 Systemic lupus erythematosus, unspecified: Secondary | ICD-10-CM | POA: Diagnosis not present

## 2024-01-07 NOTE — Telephone Encounter (Signed)
 Received fax from [facility] regarding Saphnelo  IV [phone])  infusion that patient received on 01/07/2024. Dose: 300mg  Labs were noy drawn.  Patient tolerated infusion without complications.  Changes/concerns since last visit: none  Next Saphnelo  infusion scheduled for 02/04/2024  Geraldene Kleine, PharmD, MPH, BCPS, CPP Clinical Pharmacist (Rheumatology and Pulmonology)

## 2024-01-28 DIAGNOSIS — L705 Acne excoriee des jeunes filles: Secondary | ICD-10-CM | POA: Diagnosis not present

## 2024-02-04 ENCOUNTER — Telehealth: Payer: Self-pay | Admitting: Pharmacist

## 2024-02-04 DIAGNOSIS — M329 Systemic lupus erythematosus, unspecified: Secondary | ICD-10-CM | POA: Diagnosis not present

## 2024-02-04 NOTE — Telephone Encounter (Signed)
 Received fax from [facility] regarding Saphnelo IV [phone]) infusion that patient received on 02/04/2024. Dose: 300mg  Labs were not drawn.  Patient tolerated infusion without complications.  Changes/concerns since last visit: none  Next Saphnelo infusion scheduled for 03/03/2024  Chesley Mires, PharmD, MPH, BCPS, CPP Clinical Pharmacist (Rheumatology and Pulmonology)

## 2024-02-05 ENCOUNTER — Encounter: Payer: Self-pay | Admitting: Internal Medicine

## 2024-02-05 ENCOUNTER — Ambulatory Visit: Payer: BC Managed Care – PPO | Attending: Internal Medicine | Admitting: Internal Medicine

## 2024-02-05 ENCOUNTER — Telehealth: Payer: Self-pay | Admitting: Internal Medicine

## 2024-02-05 VITALS — BP 112/75 | HR 91 | Resp 14 | Ht 67.0 in | Wt 175.0 lb

## 2024-02-05 DIAGNOSIS — M329 Systemic lupus erythematosus, unspecified: Secondary | ICD-10-CM | POA: Diagnosis not present

## 2024-02-05 DIAGNOSIS — I872 Venous insufficiency (chronic) (peripheral): Secondary | ICD-10-CM

## 2024-02-05 DIAGNOSIS — M359 Systemic involvement of connective tissue, unspecified: Secondary | ICD-10-CM

## 2024-02-05 DIAGNOSIS — R21 Rash and other nonspecific skin eruption: Secondary | ICD-10-CM

## 2024-02-05 DIAGNOSIS — Z79899 Other long term (current) drug therapy: Secondary | ICD-10-CM

## 2024-02-05 NOTE — Telephone Encounter (Signed)
 Pt was in the office asking if she can receive a call from [facility] about some questions she has about her infusion in July. Pt will be in [place] city the date of her infusion and would like to know if she could go somewhere there. Pts number is [phone]

## 2024-02-05 NOTE — Progress Notes (Signed)
 Office Visit Note  Patient: Christy Lowe             Date of Birth: November 17, 1981           MRN: [id number]             PCP: Georgina Quint, MD Referring: Georgina Quint, * Visit Date: 02/05/2024   Subjective:  Follow-up    Discussed the use of AI scribe software for clinical note transcription with the patient, who gave verbal consent to proceed.  History of Present Illness   Christy Lowe is a 43 y.o. female here for follow up for SLE on saphnelo 300 mg IV monthly and HCQ 400 mg daily.  She experiences joint swelling and pain, particularly in the morning, which improves after receiving her regular medication, Saphnelo. However, the relief from this medication seems to be diminishing over time. She also takes hydroxychloroquine, cyclobenzaprine, and gabapentin.  She has ongoing skin issues characterized by blister-like lesions that resemble 'poison ivy blisters' or 'water blisters'. These lesions are not itchy but burn and take weeks to heal. They are located on her back, chest, and face, particularly around the hairline and neck. She has tried various topical treatments without improvement. She was previously misdiagnosed with MS and later prescribed Accutane, which her insurance denied. She has not been able to start this treatment and has not heard back from the prescribing physician's office.  She has swelling in her right leg, which is larger than the left by about two and a half inches by the end of the day. She has tried compression socks and Lasix without significant improvement. The swelling is persistent and does not respond well to these treatments.  She has a history of a hysterectomy and reports that some of her symptoms have improved since the surgery. She has not been sick recently and has not taken antibiotics.     Previous HPI 11/07/23 Christy Lowe is a 43 y.o. female here for follow up for systemic lupus on hydroxychloroquine 400 mg daily,  Saphnelo 300 mg IV monthly.  She presents with persistent itching and the development of water-filled blisters on the face, neck, and chest. The itching is described as severe, with the patient noting that it feels like they want to "scratch their skin off." The itching and blistering are temporarily relieved following their regular infusions, but symptoms return in the week leading up to the next scheduled infusion.   The patient also reports cold, tingling sensations in their toes, and severe heel pain when in contact with the bed. They note that these symptoms also improve following their infusions but return as the effects of the infusion wear off.   In addition to these symptoms, the patient has been experiencing joint stiffness, which also worsens in the week leading up to their infusion. They have a history of transient ischemic attacks (TIAs), but have not experienced one recently.   The patient has been on a regimen of gabapentin and hydroxyzine to manage their symptoms. They report that the gabapentin has been helpful in managing the itching, and they continue to take hydroxyzine at night to prevent severe itching episodes.   The patient has a history of high D-dimer levels and has been advised to take a daily aspirin. They also have hemangiomas throughout their body on imaging. Despite these findings, extensive testing has not revealed any blood clots.   The patient's symptoms and their trajectory have led to multiple diagnoses, including  MS and lupus. However, the patient expresses frustration with the lack of a definitive diagnosis and treatment plan. They are seeking a solution to alleviate their persistent itching and blistering, as well as their other symptoms. She was recommended to have screening for antiphospholipid syndrome per neurologist, who otherwise found age advanced degenerative change on MRI but no inflammatory lesions.     Previous HPI 08/08/2023 Christy Lowe is a 43  y.o. female here for follow up for systemic lupus on hydroxychloroquine 400 mg daily, Saphnelo 300 mg IV monthly.  Last treatment infusion was on August 28 still noticing a improvement immediately afterwards but with some decline currently has active symptoms again only 2 weeks later.  She did have a recent work trip to [place].  Experienced some increased leg swelling after plane flight despite use of compressive sock and taking Lasix.  Still has the active rash with itching and bumps that appear fluid-filled and subsequently unroofed by scratching along the jawline and sides of the neck.  Has experienced a few more painful ulcers most often on the roof of the mouth.   She saw Dr. Reche Dixon for evaluation August 20. He did not see evidence consistent for active cutaneous lupus.  Recommended neurology consultation for suspected MS. previously saw local neurology with negative antibody testing and normal nerve conduction study had not recommended any continued treatment there was treated for several years previously.  Current appointment for this now scheduled in January.   Previous HPI 05/15/2023 Christy Lowe is a 43 y.o. female here for follow up for systemic lupus on hydroxychloroquine 400 mg daily, Saphnelo 300 mg IV monthly.  No new severe disease flareup.  She is seeing definite benefit with her infusions but notices some waning efficacy especially in the last week between dosing this was noticeable she thinks the last 4 treatments.  Also having issue with persistent skin problems legs are very itchy without visible rashes.  She is trying multiple medications for this including Allegra and the hydroxyzine with neurologist partially helpful.  Had a recent sinus infection but with prolonged course had x-ray concerning for left upper lung infiltrate so was treated with a Z-Pak.  No other infections or antibiotics since her last visit.   Previous HPI 02/19/23 Christy Lowe is a 43 y.o. female here for  follow up for SLE on hydroxychloroquine 400 mg daily and Saphnelo 300 mg IV monthly.  Symptoms with arthritis, rashes, mucosal ulcers, pedal edema.  Since her last visit is recently started to do a lot worse with biggest complaint of increased swelling.  She has been noticing discoloration and puffiness especially in the fingers on both hands.  Also somewhat more leg swelling.  She has tried taking the Lasix and not seeing a large increase in your output nor any improvement in the leg swelling.  She has been having what feels like UTIs about monthly.  Predominantly urinary urgency and frequency no other associated symptoms.  Skin rashes have been ongoing around the neck and jaw area about the same as before.  Does not see discrete joint swelling so much as the overall puffiness but does have increased pain and stiffness in both hands. Sometimes going numb and particularly has trouble gripping tightly cannot seem to exert a normal amount of force.    Previous HPI 11/07/22 Christy Ping Weltman is a 43 y.o. female here for follow up for SLE on HCQ 400 mg daily and saphnelo 300 mg IV monthly. Symptoms including arthritis, rashes,  mucosal ulcers and pedal edema.  She is noticing a good benefit with the Saphnelo infusions but describes pain in her joints and generalized sensitivity gets worse again by at least 1 week before each next treatment is due.  Noticing a few new areas of swelling of her hands and skin discoloration on her hands the rashes around her head and neck are about the same.  She had recent evaluation at [facility] with no superficial veins amenable to procedure intervention identified.  The swelling is becoming faster to develop and somewhat more extensive she notices mild discoloration around the lower shin and ankle described a bluish hue when very swollen.  She quit her job due to these problems knowing that the stress of working continuous 12-hour shifts was worsening symptoms and having to spend some many  months in [place] or travel throughout the country was very problematic for maintaining medication access.   Previous HPI 08/09/22 Christy Ping Osentoski is a 43 y.o. female here for follow up for lupus on HCQ 400 mg daily and saphnelo 300 mg IV monthly.  Her joint symptoms have remained pretty well controlled after completely tapering off the prednisone.  She is noticing a difference in symptoms after the Saphnelo infusion versus 4 weeks later proceeding her next dose.  Continues to have very little appetite but not seeing any weight reduction.  Skin itching especially on her upper legs is a persistent issue she is using topical emollients with partial benefit.  More recently she is also experiencing an increase in urinary frequency both daytime and increased nocturia.  No visible blood in urine or dysuria.  She is currently anticipating an upcoming plastic surgery needing adjustments for perioperative drug management.   Previous HPI 05/01/2022 Christy Ping Sontag is a 43 y.o. female here for follow up for lupus on HCQ 400 mg daily started saphnelo infusion and prednisone tapering down to 2.5 mg daily now. So far she is noticing some improvement with overall severity of skin redness swelling and itching but definitely not resolved.  Face and chest continue having the most erythema.  She has noticed some bruising without any specific cause.  She is noticing some swelling or otherwise thickening in the joints of her right hand and around the right wrist.   Previous HPI 04/06/2022 Christy Ping Schappert is a 43 y.o. female here for follow up for lupus on HCQ 400 mg daily started saphnelo infusion last month and prednisone tapering down to 2.5 mg daily now. She felt a large improvement after her first treatment with decreased joint pains and decreased abdominal pain. She is tapering off the prednisone without severe problem. She has noticed oral ulcers on the top of her mouth for several days and is having a rash with  small papules on both sides of her neck and lower face. Appetite is decreased but not losing any weight. She feels a lot of stress after dealing with work incident.   Previous HPI 03/06/2022 Christy Ping Polivka is a 43 y.o. female here for follow up for lupus on HCQ 400 mg daily and prednisone tapering down to 10 mg daily after increase to 30 last month from flare up with mouth ulcers and diffuse rashes. She had several complications with high dose prednisone increased weight, mood irritability, and wound dehiscence af hysterectomy cuff site. We discussed plan to start saphenlo infusion as a replacement for benlysta treatment which did not improve symptoms or lab abnormalities much after about 6 months treatment.  She has  some improvement in the mouth and lip ulcers she is not sure whether starting the valacyclovir about 4 days ago has had significant effect.  She completed the course of oral antibiotics abdominal pain is partially better. She is scheduled for first saphnelo infusion Thursday and has OBGYN followup later this week.     Previous HPI 12/13/21 Christy Ping Chern is a 43 y.o. female here for follow up for systemic lupus with ongoing joint pains and skin rash on HCQ 400 mg daily, Benlysta 200 mg [place] weekly, and prednisone 10 mg daily. Since our last visit she went for hysterectomy this was uneventful and has recovered well. She was off the benlysta for about 1 month in total around the surgery. She has not felt much improvement so far still having a lot of fatigue, skin rash and flushing, joint pain in several areas with left wrist swelling. Headaches are about the same as before but now having recurrent episodes of vertigo. She is now taking hydroxyzine twice on some days due to persistent itching, and feels excessively hot most of the time. She Is back to taking 10 mg of prednisone when trying to drop this to 5 mg felt extreme fatigue and back pain that felt like bruising and stiffness all over her  body.   Previous HPI 11/17/20 Christy Ping Dewald is a 43 y.o. female here for evaluation of positive ANA. She is noticing multiple symptoms including generalized alopecia, oral dryness, left sided neck swelling, joint pain and stiffness, position tremor, and episodic skin rashes. She has had multiple ongoing problems since many years ago. She was hospitalized for viral meningitis at age 76 and feels she has been susceptible to infections ever since this time or before, with numerous cases of bronchitis and has had some lung nodules on CT imaging during these repeat episodes. About 7 years ago she suffered from a RLE DVT provoked after right knee arthroscopy and developed right sided face and arm numbness. Workup indicated she had a TIA also noted to have PFO. She takes ASA since that time and has taken anticoagulation in perioperative periods. Workup with MRI and LP studies were apparently also indicative for MS. She was treated with multiple rounds of steroids for months at a time and felt a lot of mood disturbance from these. She was never started on other immunomodulatory or immunosuppressive treatments.   Review of Systems  Constitutional:  Negative for fatigue.  HENT:  Positive for mouth sores. Negative for mouth dryness.   Eyes:  Negative for dryness.  Respiratory:  Negative for shortness of breath.   Cardiovascular:  Negative for chest pain and palpitations.  Gastrointestinal:  Negative for blood in stool, constipation and diarrhea.  Endocrine: Negative for increased urination.  Genitourinary:  Negative for involuntary urination.  Musculoskeletal:  Positive for joint pain, joint pain, joint swelling, myalgias, morning stiffness and myalgias. Negative for gait problem, muscle weakness and muscle tenderness.  Skin:  Positive for rash and sensitivity to sunlight. Negative for color change and hair loss.  Allergic/Immunologic: Positive for susceptible to infections.  Neurological:  Negative  for dizziness and headaches.  Hematological:  Negative for swollen glands.  Psychiatric/Behavioral:  Positive for sleep disturbance. Negative for depressed mood. The patient is not nervous/anxious.     PMFS History:  Patient Active Problem List   Diagnosis Date Noted   Vitamin D deficiency 11/07/2023   Localized swelling of right lower leg 11/01/2022   Chronic venous insufficiency of lower extremity 11/01/2022  Hyperglycemia 05/23/2022   Obesity without serious comorbidity 05/23/2022   Rash and other nonspecific skin eruption 04/06/2022   Leukocytosis 03/06/2022   Redness 11/14/2021   Blurry vision 11/07/2021   Hot flash not due to menopause 11/04/2021   S/P laparoscopic hysterectomy 10/31/2021   Urinary urgency 09/09/2021   Itching 09/09/2021   High risk medication use 07/25/2021   Finger joint swelling, left 04/01/2021   Systemic lupus erythematosus (HCC) 04/01/2021   Fibromyalgia 04/01/2021   Alopecia 11/17/2020   Other fatigue 10/12/2020   Migraine without aura    History of DVT (deep vein thrombosis)    History of endometriosis    Right-sided muscle weakness 05/15/2019   Abnormal finding on MRI of brain 05/15/2019   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   Tremor 12/24/2017    Past Medical History:  Diagnosis Date   aub    Bruises easily 10/24/2021   Complication of anesthesia 10/15/2020   hypotension   Fibromyalgia    GERD (gastroesophageal reflux disease)    Heart murmur  PFO    off 81 mg aspirin since feb 2022 per pcp  no cardiologist   Hemangioma    History of DVT (deep vein thrombosis)    right le 7 yrs ago, right leg slight swollen   History of endometriosis    History of hiatal hernia    Hypotension    with 10-15-2020 endoscopy   Long-term corticosteroid use 02/20/2022   Corticosteroid   Migraine without aura    Systemic lupus erythematosus (HCC)    TIA    ages 77 and 47   Vascular insufficiency    Viral meningitis    age 17    Family History   Problem Relation Age of Onset   Alcohol abuse Mother    Cancer Father        Lung and Colon   Heart attack Father    COPD Father    Diabetes Maternal Grandmother    Cervical cancer Maternal Grandmother    Fibromyalgia Maternal Grandmother    Breast cancer Maternal Grandmother        ? age of dx   Prostate cancer Maternal Grandfather    Heart attack Maternal Grandfather    ALS Paternal Grandmother    Prostate cancer Paternal Grandfather    Heart attack Paternal Grandfather    Esophageal cancer Paternal Grandfather    Breast cancer Paternal Aunt        63's   Ovarian cancer Paternal Aunt        59's   Breast cancer Other    Lung cancer Maternal Uncle    Colon cancer Neg Hx    Stomach cancer Neg Hx    Rectal cancer Neg Hx    Past Surgical History:  Procedure Laterality Date   ABDOMINAL HYSTERECTOMY     ANTERIOR CRUCIATE LIGAMENT REPAIR Left 10/2018   CYSTOSCOPY  10/31/2021   Procedure: CYSTOSCOPY;  Surgeon: Romualdo Bolk, MD;  Location: [facility];  Service: Gynecology;;   PELVIC LAPAROSCOPY     for endometriosis yrs ago per pt on 10-24-2021   right knee lateral release and meniscal repair  2011   TONSILLECTOMY     age 59, adenoids removed also   TOTAL LAPAROSCOPIC HYSTERECTOMY WITH SALPINGECTOMY Bilateral 10/31/2021   Procedure: TOTAL LAPAROSCOPIC HYSTERECTOMY WITH SALPINGECTOMY;  Surgeon: Romualdo Bolk, MD;  Location: [facility];  Service: Gynecology;  Laterality: Bilateral;   UMBILICAL HERNIA REPAIR     yrs  ago   UPPER GI ENDOSCOPY     with esophagus stretching 10-15-2020, 03-11-2020, 05-09-2019,  2013 with being polyp removed   Social History   Social History Narrative   Not on file   Immunization History  Administered Date(s) Administered   PFIZER(Purple Top)SARS-COV-2 Vaccination 06/27/2020, 07/28/2020   PPD Test 01/23/2019     Objective: Vital Signs: BP 112/75 (BP Location: Left Arm, Patient Position: Sitting,  Cuff Size: Large)   Pulse 91   Resp 14   Ht 5\' 7"  (1.702 m)   Wt 175 lb (79.4 kg)   LMP 10/10/2021   BMI 27.41 kg/m    Physical Exam Eyes:     Conjunctiva/sclera: Conjunctivae normal.  Cardiovascular:     Rate and Rhythm: Normal rate and regular rhythm.  Pulmonary:     Effort: Pulmonary effort is normal.     Breath sounds: Normal breath sounds.  Lymphadenopathy:     Cervical: No cervical adenopathy.  Skin:    General: Skin is warm and dry.     Comments: Scattered along mandible and on upper neck rash, raised clear filled bumps, some excoriations of varying age R>L lower leg swelling without pitting  Neurological:     Mental Status: She is alert.  Psychiatric:        Mood and Affect: Mood normal.      Musculoskeletal Exam:  Shoulders full ROM no tenderness or swelling Elbows full ROM no tenderness or swelling Wrists full ROM no tenderness or swelling Fingers full ROM no tenderness or swelling Knees full ROM no tenderness or swelling     Investigation: No additional findings.  Imaging: No results found.  Recent Labs: Lab Results  Component Value Date   WBC 5.1 11/07/2023   HGB 13.5 11/07/2023   PLT 247 11/07/2023   NA 138 11/07/2023   K 4.4 11/07/2023   CL 104 11/07/2023   CO2 26 11/07/2023   GLUCOSE 63 (L) 11/07/2023   BUN 15 11/07/2023   CREATININE 0.86 11/07/2023   BILITOT 0.3 11/07/2023   ALKPHOS 47 10/26/2021   AST 22 11/07/2023   ALT 16 11/07/2023   PROT 6.7 11/07/2023   ALBUMIN 4.9 10/26/2021   CALCIUM 9.5 11/07/2023   GFRAA >60 03/10/2020   QFTBGOLDPLUS NEGATIVE 05/15/2023    Speciality Comments: PLQ Eye Exam [facility] 01/10/2024 WNL Follow-up: 12 months Appt 12/01/2023 per patient  Procedures:  No procedures performed Allergies: Cephalexin, Penicillins, Amitiza [lubiprostone], and Sulfa antibiotics   Assessment / Plan:     Visit Diagnoses: Systemic lupus erythematosus, unspecified SLE type, unspecified organ  involvement status (HCC) - Plan: Anti-DNA antibody, double-stranded, Sedimentation rate, C3 and C4 Intermittent joint swelling and pain, particularly in the hands, suggests an autoimmune process, possibly related to lupus. Managed with hydroxychloroquine, cyclobenzaprine, and gabapentin, but swelling and rashes persist. Notable variation in symptoms between infusions as well. - Continue hydroxychloroquine 400 mg daily - Continue flexeril 10 mg and gabapentin for MSK pain  - Consider adding doxycycline or minocycline for additional anti-inflammatory effects.  High risk medication use - Plan: CBC with Differential/Platelet, COMPLETE METABOLIC PANEL WITH GFR Discussed risks of adding low dose oral tetracycline for ongoing skin inflammation. Especially photosensitivity. Also risk for skin pigmentation effects. -Checking CBC and cMP for medication monitoring  Lymphedema Chronic swelling in the right leg, unresponsive to diuretics, suggests lymphedema rather than a vascular issue. - Continue pneumatic compression devices. - Avoid compression socks if she exacerbates swelling. - Consider referral to a lymphedema specialist if symptoms  persist.  Acne Excoriee(?) Persistent skin condition with blister-like lesions may be related to nerve irritation or an autoimmune process. Concern for MS but ruled out repeatedly so far. Differential diagnosis includes apparently acne excoriee, hidradenitis suppurativa, and cutaneous lupus. - Prescribe doxycycline or minocycline for anti-inflammatory effects. - Monitor for signs of drug-induced lupus. - Educate on increased photosensitivity with tetracycline antibiotics and advise on sun protection measures.    Orders: Orders Placed This Encounter  Procedures   Anti-DNA antibody, double-stranded   Sedimentation rate   C3 and C4   CBC with Differential/Platelet   COMPLETE METABOLIC PANEL WITH GFR   No orders of the defined types were placed in this  encounter.    Follow-Up Instructions: Return in about 3 months (around 05/07/2024).   Fuller Plan, MD  Note - This record has been created using [employer].  Chart creation errors have been sought, but may not always  have been located. Such creation errors do not reflect on  the standard of medical care.

## 2024-02-06 LAB — CBC WITH DIFFERENTIAL/PLATELET
Absolute Lymphocytes: 1832 {cells}/uL (ref 850–3900)
Absolute Monocytes: 385 {cells}/uL (ref 200–950)
Basophils Absolute: 39 {cells}/uL (ref 0–200)
Basophils Relative: 0.7 %
Eosinophils Absolute: 99 {cells}/uL (ref 15–500)
Eosinophils Relative: 1.8 %
HCT: 41.3 % (ref 35.0–45.0)
Hemoglobin: 14 g/dL (ref 11.7–15.5)
MCH: 30.1 pg (ref 27.0–33.0)
MCHC: 33.9 g/dL (ref 32.0–36.0)
MCV: 88.8 fL (ref 80.0–100.0)
MPV: 9.6 fL (ref 7.5–12.5)
Monocytes Relative: 7 %
Neutro Abs: 3146 {cells}/uL (ref 1500–7800)
Neutrophils Relative %: 57.2 %
Platelets: 243 10*3/uL (ref 140–400)
RBC: 4.65 10*6/uL (ref 3.80–5.10)
RDW: 12.4 % (ref 11.0–15.0)
Total Lymphocyte: 33.3 %
WBC: 5.5 10*3/uL (ref 3.8–10.8)

## 2024-02-06 LAB — COMPLETE METABOLIC PANEL WITH GFR
AG Ratio: 2.1 (calc) (ref 1.0–2.5)
ALT: 13 U/L (ref 6–29)
AST: 20 U/L (ref 10–30)
Albumin: 4.8 g/dL (ref 3.6–5.1)
Alkaline phosphatase (APISO): 51 U/L (ref 31–125)
BUN: 15 mg/dL (ref 7–25)
CO2: 27 mmol/L (ref 20–32)
Calcium: 9.9 mg/dL (ref 8.6–10.2)
Chloride: 104 mmol/L (ref 98–110)
Creat: 0.92 mg/dL (ref 0.50–0.99)
Globulin: 2.3 g/dL (ref 1.9–3.7)
Glucose, Bld: 89 mg/dL (ref 65–99)
Potassium: 4.5 mmol/L (ref 3.5–5.3)
Sodium: 139 mmol/L (ref 135–146)
Total Bilirubin: 0.4 mg/dL (ref 0.2–1.2)
Total Protein: 7.1 g/dL (ref 6.1–8.1)
eGFR: 81 mL/min/{1.73_m2} (ref >=60)

## 2024-02-06 LAB — C3 AND C4
C3 Complement: 125 mg/dL (ref 83–193)
C4 Complement: 19 mg/dL (ref 15–57)

## 2024-02-06 LAB — SEDIMENTATION RATE: Sed Rate: 6 mm/h (ref 0–20)

## 2024-02-06 LAB — ANTI-DNA ANTIBODY, DOUBLE-STRANDED: ds DNA Ab: 14 [IU]/mL — ABNORMAL HIGH

## 2024-02-11 ENCOUNTER — Telehealth: Payer: Self-pay | Admitting: Pharmacist

## 2024-02-11 DIAGNOSIS — M9902 Segmental and somatic dysfunction of thoracic region: Secondary | ICD-10-CM | POA: Diagnosis not present

## 2024-02-11 DIAGNOSIS — M62830 Muscle spasm of back: Secondary | ICD-10-CM | POA: Diagnosis not present

## 2024-02-11 DIAGNOSIS — M9901 Segmental and somatic dysfunction of cervical region: Secondary | ICD-10-CM | POA: Diagnosis not present

## 2024-02-11 DIAGNOSIS — M9903 Segmental and somatic dysfunction of lumbar region: Secondary | ICD-10-CM | POA: Diagnosis not present

## 2024-02-11 MED ORDER — HYDROXYCHLOROQUINE SULFATE 200 MG PO TABS: 400.0000 mg | ORAL_TABLET | Freq: Every day | ORAL | 1 refills | Status: DC

## 2024-02-11 NOTE — Telephone Encounter (Signed)
 Spoke with patient - she will be in [place] from 05/20/24 through 06/15/2024. She will need Saphnelo infusion completed while. I spoke with [facility] that cannot take referral from out-of-state providers. Another infusion center [facility] states that they can orders.  Will plan to place referral in May 2025 to prevent any authorization overlap issues with current site of care.  She also inquired about a 90-day supply of rx's being sent. I advised that this is how we've sent the prescriptions for cyclobenzaprine and hydroxychloroquine but pharmacy is dispensing the 30-day supply perhaps d/t insurance restriction. She will f/u with pharmacy  Chesley Mires, PharmD, MPH, BCPS, CPP Clinical Pharmacist (Rheumatology and Pulmonology)

## 2024-02-11 NOTE — Telephone Encounter (Signed)
 I was speaking with patient about her Saphnelo infusion and she was requesting any update on the antibiotic that was going to be prescribed (Minocycline?). Routing to Dr. Laren Everts, PharmD, MPH, BCPS, CPP Clinical Pharmacist (Rheumatology and Pulmonology)

## 2024-02-18 ENCOUNTER — Encounter: Payer: Self-pay | Admitting: Internal Medicine

## 2024-02-18 DIAGNOSIS — R21 Rash and other nonspecific skin eruption: Secondary | ICD-10-CM

## 2024-02-18 DIAGNOSIS — M329 Systemic lupus erythematosus, unspecified: Secondary | ICD-10-CM

## 2024-02-18 NOTE — Telephone Encounter (Signed)
 After reviewing the patient's chart, advised the patient a 3 month supply of hydroxychloroquine with 1 refill was sent in on 02/11/2024. Patient verbalized understanding. Patient inquired if she could get a refill of Cyclobenzaprine sent in, patient states the prescription is for one tablet daily but sometimes she takes two tablets daily and runs out. Patient also inquired about the minocycline as well. Patient states if the cyclobenzaprine and minocycline can be sent in, she would like them sent to the [facility] on [address] in [place]. Please advise.

## 2024-02-25 ENCOUNTER — Other Ambulatory Visit: Payer: Self-pay | Admitting: Internal Medicine

## 2024-02-25 ENCOUNTER — Telehealth: Payer: Self-pay | Admitting: Internal Medicine

## 2024-02-25 DIAGNOSIS — Z86718 Personal history of other venous thrombosis and embolism: Secondary | ICD-10-CM

## 2024-02-25 DIAGNOSIS — M329 Systemic lupus erythematosus, unspecified: Secondary | ICD-10-CM

## 2024-02-25 MED ORDER — CYCLOBENZAPRINE HCL 10 MG PO TABS: ORAL_TABLET | ORAL | 0 refills | Status: DC

## 2024-02-25 MED ORDER — MINOCYCLINE HCL 100 MG PO CAPS: 100.0000 mg | ORAL_CAPSULE | Freq: Two times a day (BID) | ORAL | 2 refills | Status: DC

## 2024-02-25 NOTE — Telephone Encounter (Signed)
 Patient contacted the office requesting a referral to [facility] .  Patient request referral for pain *urgent and with no ultrasound*  Patient's preferred area for referral is: [facility]    Pt stated it has to say urgent and with no ultrasound

## 2024-02-25 NOTE — Telephone Encounter (Signed)
 Referral Placed

## 2024-02-25 NOTE — Telephone Encounter (Signed)
 Contacted the patient and patient stated the referral is for her worsening leg swelling.

## 2024-02-25 NOTE — Telephone Encounter (Signed)
 I have sent prescriptions for the flexeril and minocycline to [facility]. The minocycline was sent for only 30 days at a time until we see if it is helping and well tolerated. She should make sure to be diligent with skin protection because this drug combination can make skin more sensitive to UV radiation.

## 2024-02-25 NOTE — Telephone Encounter (Signed)
 Okay to place referral. I assume this is with regards to her worsening R>L leg swelling we discussed at last visit that she reports was getting worse.

## 2024-02-25 NOTE — Telephone Encounter (Signed)
 Patient advised Dr. Dimple Casey has sent prescriptions for the flexeril and minocycline [facility]. The minocycline was sent for only 30 days at a time until we see if it is helping and well tolerated. She should make sure to be diligent with skin protection because this drug combination can make skin more sensitive to UV radiation. Patient verbalized understanding.

## 2024-02-26 NOTE — Telephone Encounter (Signed)
 Prescription was filled yesterday for TAKE 1 TABLET BY MOUTH AT BEDTIME AS NEEDED FOR MUSCLE SPASMS.   Pharmacy states the patient stopped by yesterday and states she is supposed to take 2 tablets daily.   Please advise if the patient should be taking 1 tablet daily or 2 tablets daily.

## 2024-02-27 DIAGNOSIS — I83891 Varicose veins of right lower extremities with other complications: Secondary | ICD-10-CM | POA: Diagnosis not present

## 2024-02-27 DIAGNOSIS — R600 Localized edema: Secondary | ICD-10-CM | POA: Diagnosis not present

## 2024-02-27 DIAGNOSIS — I87391 Chronic venous hypertension (idiopathic) with other complications of right lower extremity: Secondary | ICD-10-CM | POA: Diagnosis not present

## 2024-02-27 DIAGNOSIS — L299 Pruritus, unspecified: Secondary | ICD-10-CM | POA: Diagnosis not present

## 2024-03-03 ENCOUNTER — Telehealth: Payer: Self-pay | Admitting: Pharmacist

## 2024-03-03 NOTE — Telephone Encounter (Signed)
 Received fax from [facility] regarding Saphnelo IV [phone]) infusion that patient received on 03/03/2024. Dose: 300mg  Labs were drawn.  Patient tolerated infusion without complications.  Changes/concerns since last visit: none  Next Saphnelo infusion scheduled for 03/31/2024

## 2024-03-06 ENCOUNTER — Other Ambulatory Visit: Payer: Self-pay | Admitting: *Deleted

## 2024-03-06 DIAGNOSIS — R2241 Localized swelling, mass and lump, right lower limb: Secondary | ICD-10-CM

## 2024-03-17 ENCOUNTER — Ambulatory Visit (HOSPITAL_COMMUNITY)
Admission: RE | Admit: 2024-03-17 | Discharge: 2024-03-17 | Disposition: A | Discharge status: Home / Self Care | Source: Ambulatory Visit | Attending: Surgery | Admitting: Surgery

## 2024-03-17 DIAGNOSIS — R2241 Localized swelling, mass and lump, right lower limb: Secondary | ICD-10-CM | POA: Insufficient documentation

## 2024-03-23 ENCOUNTER — Ambulatory Visit: Admitting: Obstetrics and Gynecology

## 2024-03-31 DIAGNOSIS — M329 Systemic lupus erythematosus, unspecified: Secondary | ICD-10-CM | POA: Diagnosis not present

## 2024-04-01 ENCOUNTER — Telehealth: Payer: Self-pay | Admitting: Pharmacist

## 2024-04-01 NOTE — Telephone Encounter (Signed)
 Received fax from [facility] regarding Saphnelo  [phone]) infusion that patient received on 03/31/2024. Dose: 300mg  Labs were not drawn.  Patient tolerated infusion without complications.  Changes/concerns since last visit: none  Next SAPHNELO  infusion scheduled for 6/2/20254  Geraldene Kleine, PharmD, MPH, BCPS, CPP Clinical Pharmacist (Rheumatology and Pulmonology)

## 2024-04-08 ENCOUNTER — Ambulatory Visit

## 2024-04-24 NOTE — Progress Notes (Signed)
 Office Visit Note  Patient: Christy Lowe             Date of Birth: 1981/05/26           MRN: [id number]             PCP: Elvira Hammersmith, MD Referring: Elvira Hammersmith, * Visit Date: 05/07/2024   Subjective:  Follow-up (Patient states her refills have been weird. Patient states she would like to talk to Devki today as well. )   History of Present Illness:   Discussed the use of AI scribe software for clinical note transcription with the patient, who gave verbal consent to proceed.  History of Present Illness   Christy Lowe is a 43 y.o. female here for follow up for SLE on saphnelo  300 mg IV monthly and HCQ 400 mg daily.  She presents with concerns about medication side effects and persistent symptoms.  She has a persistent rash on her face and neck, attributed to sun exposure, causing small blisters. She has been taking minocycline , initially prescribed at one pill twice a day, but due to queasiness, she is only taking one pill at night. Despite the reduced dosage, there is improvement in the rash especially around the edges of the face and sides of neck.  Her current lupus medication, Saphnelo , is not working as effectively as before. She experienced better results when the infusion was administered more slowly during a previous session. Recently, she has been experiencing pain earlier in the month than usual, which typically occurs closer to her infusion date.  Some may be seeing a very good response between 2 to 3 weeks.  She describes a new symptom of her left eyelid getting 'stuck' intermittently without associated pain or irritation. This occurs randomly and is not related to the time of day or fatigue.  She experiences right leg swelling, particularly in the morning, which worsens throughout the day. She recently underwent a CT scan to check for any blockages, as previous ultrasounds were inconclusive.  She experiences pain in her hand, particularly when  gripping, and notes a grinding sensation.  Previous HPI 02/05/2024 Christy Lowe is a 43 y.o. female here for follow up for SLE on saphnelo  300 mg IV monthly and HCQ 400 mg daily.   She experiences joint swelling and pain, particularly in the morning, which improves after receiving her regular medication, Saphnelo . However, the relief from this medication seems to be diminishing over time. She also takes hydroxychloroquine , cyclobenzaprine , and gabapentin .   She has ongoing skin issues characterized by blister-like lesions that resemble 'poison ivy blisters' or 'water blisters'. These lesions are not itchy but burn and take weeks to heal. They are located on her back, chest, and face, particularly around the hairline and neck. She has tried various topical treatments without improvement. She was previously misdiagnosed with MS and later prescribed Accutane, which her insurance denied. She has not been able to start this treatment and has not heard back from the prescribing physician's office.   She has swelling in her right leg, which is larger than the left by about two and a half inches by the end of the day. She has tried compression socks and Lasix  without significant improvement. The swelling is persistent and does not respond well to these treatments.   She has a history of a hysterectomy and reports that some of her symptoms have improved since the surgery. She has not been sick recently and has not taken  antibiotics.        Previous HPI 11/07/23 Christy Lowe is a 43 y.o. female here for follow up for systemic lupus on hydroxychloroquine  400 mg daily, Saphnelo  300 mg IV monthly.  She presents with persistent itching and the development of water-filled blisters on the face, neck, and chest. The itching is described as severe, with the patient noting that it feels like they want to scratch their skin off. The itching and blistering are temporarily relieved following their regular  infusions, but symptoms return in the week leading up to the next scheduled infusion.   The patient also reports cold, tingling sensations in their toes, and severe heel pain when in contact with the bed. They note that these symptoms also improve following their infusions but return as the effects of the infusion wear off.   In addition to these symptoms, the patient has been experiencing joint stiffness, which also worsens in the week leading up to their infusion. They have a history of transient ischemic attacks (TIAs), but have not experienced one recently.   The patient has been on a regimen of gabapentin  and hydroxyzine  to manage their symptoms. They report that the gabapentin  has been helpful in managing the itching, and they continue to take hydroxyzine  at night to prevent severe itching episodes.   The patient has a history of high D-dimer levels and has been advised to take a daily aspirin. They also have hemangiomas throughout their body on imaging. Despite these findings, extensive testing has not revealed any blood clots.   The patient's symptoms and their trajectory have led to multiple diagnoses, including MS and lupus. However, the patient expresses frustration with the lack of a definitive diagnosis and treatment plan. They are seeking a solution to alleviate their persistent itching and blistering, as well as their other symptoms. She was recommended to have screening for antiphospholipid syndrome per neurologist, who otherwise found age advanced degenerative change on MRI but no inflammatory lesions.     Previous HPI 08/08/2023 Christy Lowe is a 43 y.o. female here for follow up for systemic lupus on hydroxychloroquine  400 mg daily, Saphnelo  300 mg IV monthly.  Last treatment infusion was on August 28 still noticing a improvement immediately afterwards but with some decline currently has active symptoms again only 2 weeks later.  She did have a recent work trip to [place].   Experienced some increased leg swelling after plane flight despite use of compressive sock and taking Lasix .  Still has the active rash with itching and bumps that appear fluid-filled and subsequently unroofed by scratching along the jawline and sides of the neck.  Has experienced a few more painful ulcers most often on the roof of the mouth.   She saw Dr. Jorizzo for evaluation August 20. He did not see evidence consistent for active cutaneous lupus.  Recommended neurology consultation for suspected MS. previously saw local neurology with negative antibody testing and normal nerve conduction study had not recommended any continued treatment there was treated for several years previously.  Current appointment for this now scheduled in January.   Previous HPI 05/15/2023 Christy Hoar Denley is a 43 y.o. female here for follow up for systemic lupus on hydroxychloroquine  400 mg daily, Saphnelo  300 mg IV monthly.  No new severe disease flareup.  She is seeing definite benefit with her infusions but notices some waning efficacy especially in the last week between dosing this was noticeable she thinks the last 4 treatments.  Also having issue with  persistent skin problems legs are very itchy without visible rashes.  She is trying multiple medications for this including Allegra and the hydroxyzine  with neurologist partially helpful.  Had a recent sinus infection but with prolonged course had x-ray concerning for left upper lung infiltrate so was treated with a Z-Pak.  No other infections or antibiotics since her last visit.   Previous HPI 02/19/23 Christy Hoar Game is a 43 y.o. female here for follow up for SLE on hydroxychloroquine  400 mg daily and Saphnelo  300 mg IV monthly.  Symptoms with arthritis, rashes, mucosal ulcers, pedal edema.  Since her last visit is recently started to do a lot worse with biggest complaint of increased swelling.  She has been noticing discoloration and puffiness especially in the fingers  on both hands.  Also somewhat more leg swelling.  She has tried taking the Lasix  and not seeing a large increase in your output nor any improvement in the leg swelling.  She has been having what feels like UTIs about monthly.  Predominantly urinary urgency and frequency no other associated symptoms.  Skin rashes have been ongoing around the neck and jaw area about the same as before.  Does not see discrete joint swelling so much as the overall puffiness but does have increased pain and stiffness in both hands. Sometimes going numb and particularly has trouble gripping tightly cannot seem to exert a normal amount of force.    Previous HPI 11/07/22 Christy Hoar Cozine is a 43 y.o. female here for follow up for SLE on HCQ 400 mg daily and saphnelo  300 mg IV monthly. Symptoms including arthritis, rashes, mucosal ulcers and pedal edema.  She is noticing a good benefit with the Saphnelo  infusions but describes pain in her joints and generalized sensitivity gets worse again by at least 1 week before each next treatment is due.  Noticing a few new areas of swelling of her hands and skin discoloration on her hands the rashes around her head and neck are about the same.  She had recent evaluation at [facility] with no superficial veins amenable to procedure intervention identified.  The swelling is becoming faster to develop and somewhat more extensive she notices mild discoloration around the lower shin and ankle described a bluish hue when very swollen.  She quit her job due to these problems knowing that the stress of working continuous 12-hour shifts was worsening symptoms and having to spend some many months in [place]  or travel throughout the country was very problematic for maintaining medication access.   Previous HPI 08/09/22 Christy Hoar Franssen is a 43 y.o. female here for follow up for lupus on HCQ 400 mg daily and saphnelo  300 mg IV monthly.  Her joint symptoms have remained pretty well controlled after completely  tapering off the prednisone .  She is noticing a difference in symptoms after the Saphnelo  infusion versus 4 weeks later proceeding her next dose.  Continues to have very little appetite but not seeing any weight reduction.  Skin itching especially on her upper legs is a persistent issue she is using topical emollients with partial benefit.  More recently she is also experiencing an increase in urinary frequency both daytime and increased nocturia.  No visible blood in urine or dysuria.  She is currently anticipating an upcoming plastic surgery needing adjustments for perioperative drug management.   Previous HPI 05/01/2022 Christy Hoar Schubring is a 43 y.o. female here for follow up for lupus on HCQ 400 mg daily started saphnelo  infusion and prednisone   tapering down to 2.5 mg daily now. So far she is noticing some improvement with overall severity of skin redness swelling and itching but definitely not resolved.  Face and chest continue having the most erythema.  She has noticed some bruising without any specific cause.  She is noticing some swelling or otherwise thickening in the joints of her right hand and around the right wrist.   Previous HPI 04/06/2022 Christy Hoar Strohmeier is a 43 y.o. female here for follow up for lupus on HCQ 400 mg daily started saphnelo  infusion last month and prednisone  tapering down to 2.5 mg daily now. She felt a large improvement after her first treatment with decreased joint pains and decreased abdominal pain. She is tapering off the prednisone  without severe problem. She has noticed oral ulcers on the top of her mouth for several days and is having a rash with small papules on both sides of her neck and lower face. Appetite is decreased but not losing any weight. She feels a lot of stress after dealing with work incident.   Previous HPI 03/06/2022 Christy Hoar Scrima is a 43 y.o. female here for follow up for lupus on HCQ 400 mg daily and prednisone  tapering down to 10 mg daily after  increase to 30 last month from flare up with mouth ulcers and diffuse rashes. She had several complications with high dose prednisone  increased weight, mood irritability, and wound dehiscence af hysterectomy cuff site. We discussed plan to start saphenlo infusion as a replacement for benlysta  treatment which did not improve symptoms or lab abnormalities much after about 6 months treatment.  She has some improvement in the mouth and lip ulcers she is not sure whether starting the valacyclovir  about 4 days ago has had significant effect.  She completed the course of oral antibiotics abdominal pain is partially better. She is scheduled for first saphnelo  infusion Thursday and has OBGYN followup later this week.     Previous HPI 12/13/21 Christy Hoar Ikard is a 43 y.o. female here for follow up for systemic lupus with ongoing joint pains and skin rash on HCQ 400 mg daily, Benlysta  200 mg [place] weekly, and prednisone  10 mg daily. Since our last visit she went for hysterectomy this was uneventful and has recovered well. She was off the benlysta  for about 1 month in total around the surgery. She has not felt much improvement so far still having a lot of fatigue, skin rash and flushing, joint pain in several areas with left wrist swelling. Headaches are about the same as before but now having recurrent episodes of vertigo. She is now taking hydroxyzine  twice on some days due to persistent itching, and feels excessively hot most of the time. She Is back to taking 10 mg of prednisone  when trying to drop this to 5 mg felt extreme fatigue and back pain that felt like bruising and stiffness all over her body.   Previous HPI 11/17/20 Christy Hoar Sharman is a 43 y.o. female here for evaluation of positive ANA. She is noticing multiple symptoms including generalized alopecia, oral dryness, left sided neck swelling, joint pain and stiffness, position tremor, and episodic skin rashes. She has had multiple ongoing problems since  many years ago. She was hospitalized for viral meningitis at age 61 and feels she has been susceptible to infections ever since this time or before, with numerous cases of bronchitis and has had some lung nodules on CT imaging during these repeat episodes. About 7 years ago she suffered from  a RLE DVT provoked after right knee arthroscopy and developed right sided face and arm numbness. Workup indicated she had a TIA also noted to have PFO. She takes ASA since that time and has taken anticoagulation in perioperative periods. Workup with MRI and LP studies were apparently also indicative for MS. She was treated with multiple rounds of steroids for months at a time and felt a lot of mood disturbance from these. She was never started on other immunomodulatory or immunosuppressive treatments.   Review of Systems  Constitutional:  Positive for fatigue.  HENT:  Positive for mouth sores. Negative for mouth dryness.   Eyes:  Negative for dryness.  Respiratory:  Negative for shortness of breath.   Cardiovascular:  Negative for chest pain and palpitations.  Gastrointestinal:  Negative for blood in stool, constipation and diarrhea.  Endocrine: Negative for increased urination.  Genitourinary:  Negative for involuntary urination.  Musculoskeletal:  Positive for joint pain, joint pain, joint swelling, myalgias, morning stiffness and myalgias. Negative for gait problem, muscle weakness and muscle tenderness.  Skin:  Positive for rash and sensitivity to sunlight. Negative for color change and hair loss.  Allergic/Immunologic: Positive for susceptible to infections.  Neurological:  Negative for dizziness and headaches.  Hematological:  Negative for swollen glands.  Psychiatric/Behavioral:  Negative for depressed mood and sleep disturbance. The patient is not nervous/anxious.     PMFS History:  Patient Active Problem List   Diagnosis Date Noted   Vitamin D  deficiency 11/07/2023   Localized swelling of right  lower leg 11/01/2022   Chronic venous insufficiency of lower extremity 11/01/2022   Hyperglycemia 05/23/2022   Obesity without serious comorbidity 05/23/2022   Rash and other nonspecific skin eruption 04/06/2022   Leukocytosis 03/06/2022   Redness 11/14/2021   Blurry vision 11/07/2021   Hot flash not due to menopause 11/04/2021   S/P laparoscopic hysterectomy 10/31/2021   Urinary urgency 09/09/2021   Itching 09/09/2021   High risk medication use 07/25/2021   Finger joint swelling, left 04/01/2021   Systemic lupus erythematosus (HCC) 04/01/2021   Fibromyalgia 04/01/2021   Alopecia 11/17/2020   Other fatigue 10/12/2020   Migraine without aura    History of DVT (deep vein thrombosis)    History of endometriosis    Right-sided muscle weakness 05/15/2019   Abnormal finding on MRI of brain 05/15/2019   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   Tremor 12/24/2017    Past Medical History:  Diagnosis Date   aub    Bruises easily 10/24/2021   Complication of anesthesia 10/15/2020   hypotension   Fibromyalgia    GERD (gastroesophageal reflux disease)    Heart murmur  PFO    off 81 mg aspirin since feb 2022 per pcp  no cardiologist   Hemangioma    History of DVT (deep vein thrombosis)    right le 7 yrs ago, right leg slight swollen   History of endometriosis    History of hiatal hernia    Hypotension    with 10-15-2020 endoscopy   Long-term corticosteroid use 02/20/2022   Corticosteroid   Migraine without aura    Systemic lupus erythematosus (HCC)    TIA    ages 48 and 29   Vascular insufficiency    Viral meningitis    age 43    Family History  Problem Relation Age of Onset   Alcohol abuse Mother    Cancer Father        Lung and Colon   Heart attack Father  COPD Father    Diabetes Maternal Grandmother    Cervical cancer Maternal Grandmother    Fibromyalgia Maternal Grandmother    Breast cancer Maternal Grandmother        ? age of dx   Prostate cancer Maternal  Grandfather    Heart attack Maternal Grandfather    ALS Paternal Grandmother    Prostate cancer Paternal Grandfather    Heart attack Paternal Grandfather    Esophageal cancer Paternal Grandfather    Breast cancer Paternal Aunt        39's   Ovarian cancer Paternal Aunt        27's   Breast cancer Other    Lung cancer Maternal Uncle    Colon cancer Neg Hx    Stomach cancer Neg Hx    Rectal cancer Neg Hx    Past Surgical History:  Procedure Laterality Date   ABDOMINAL HYSTERECTOMY     ANTERIOR CRUCIATE LIGAMENT REPAIR Left 10/2018   CYSTOSCOPY  10/31/2021   Procedure: CYSTOSCOPY;  Surgeon: Wanita Gutta, MD;  Location: [facility];  Service: Gynecology;;   PELVIC LAPAROSCOPY     for endometriosis yrs ago per pt on 10-24-2021   right knee lateral release and meniscal repair  2011   TONSILLECTOMY     age 83, adenoids removed also   TOTAL LAPAROSCOPIC HYSTERECTOMY WITH SALPINGECTOMY Bilateral 10/31/2021   Procedure: TOTAL LAPAROSCOPIC HYSTERECTOMY WITH SALPINGECTOMY;  Surgeon: Wanita Gutta, MD;  Location: [facility];  Service: Gynecology;  Laterality: Bilateral;   UMBILICAL HERNIA REPAIR     yrs ago   UPPER GI ENDOSCOPY     with esophagus stretching 10-15-2020, 03-11-2020, 05-09-2019,  2013 with being polyp removed   Social History   Social History Narrative   Not on file   Immunization History  Administered Date(s) Administered   PFIZER(Purple Top)SARS-COV-2 Vaccination 06/27/2020, 07/28/2020   PPD Test 01/23/2019     Objective: Vital Signs: BP 118/79 (BP Location: Right Arm, Patient Position: Sitting, Cuff Size: Normal)   Pulse 76   Resp 14   Ht 5' 7 (1.702 m)   Wt 170 lb (77.1 kg)   LMP 10/10/2021   BMI 26.63 kg/m    Physical Exam Eyes:     Conjunctiva/sclera: Conjunctivae normal.  Cardiovascular:     Rate and Rhythm: Normal rate and regular rhythm.  Pulmonary:     Effort: Pulmonary effort is normal.     Breath  sounds: Normal breath sounds.  Lymphadenopathy:     Cervical: No cervical adenopathy.  Skin:    General: Skin is warm and dry.     Comments: Scattered rash on center of cheeks most along the base of the jawline with a few unroofed excoriated papules Right leg circumference slightly greater than left, no pitting edema or overlying skin rash No digital pitting  Neurological:     Mental Status: She is alert.  Psychiatric:        Mood and Affect: Mood normal.      Musculoskeletal Exam:  Shoulders full ROM no tenderness or swelling Elbows full ROM no tenderness or swelling Wrists full ROM no tenderness or swelling Fingers full ROM, right second MCP tenderness to pressure, nontender cyst or other soft tissue swelling on dorsum of second DIP Knees full ROM no tenderness or swelling Ankles full ROM no tenderness or swelling  Investigation: No additional findings.  Imaging: No results found.  Recent Labs: Lab Results  Component Value Date  WBC 5.5 02/05/2024   HGB 14.0 02/05/2024   PLT 243 02/05/2024   NA 139 02/05/2024   K 4.5 02/05/2024   CL 104 02/05/2024   CO2 27 02/05/2024   GLUCOSE 89 02/05/2024   BUN 15 02/05/2024   CREATININE 0.92 02/05/2024   BILITOT 0.4 02/05/2024   ALKPHOS 47 10/26/2021   AST 20 02/05/2024   ALT 13 02/05/2024   PROT 7.1 02/05/2024   ALBUMIN 4.9 10/26/2021   CALCIUM 9.9 02/05/2024   GFRAA >60 03/10/2020   QFTBGOLDPLUS NEGATIVE 05/15/2023    Speciality Comments: PLQ Eye Exam  Ophthalmology 01/10/2024 WNL Follow-up: 12 months Appt 12/01/2023 per patient  Procedures:  No procedures performed Allergies: Cephalexin, Penicillins, Lubiprostone, and Sulfa antibiotics   Assessment / Plan:     Visit Diagnoses: Systemic lupus erythematosus, unspecified SLE type, unspecified organ involvement status (HCC) - flexeril  10 mg and gabapentin  for MSK pain - Plan: cyclobenzaprine  (FLEXERIL ) 10 MG tablet, Anti-DNA antibody,  double-stranded Suboptimal response to Saphnelo  infusions with decreased efficacy and earlier onset of pain.  Hydroxychloroquine  seems to be partially effective although optimal weight based dosing is now slightly below 400 mg after some additional patient weight loss.  Previous Benlysta  trial was ineffective.  - Continue Saphnelo  infusions 300 mg IV monthly - Continue hydroxychloroquine  400 mg daily x5days half dose 2 days/week - Continue flexeril  10 mg and gabapentin  for MSK pain  - Continue minocycline  100 mg currently tolerated at once daily  High risk medication use - hydroxychloroquine  400 mg daily, PLQ Eye Exam [facility] Ophthalmology 01/10/2024 WNL - Plan: CBC with Differential/Platelet, Comprehensive metabolic panel with GFR, QuantiFERON-TB Gold Plus No serious interval infections.  May be having some increased photosensitivity related to minocycline  she is diligent with skin UV protection. - Checking CBC and CMP for medication monitoring continue long-term use of Saphnelo  hydroxychloroquine  and minocycline  - Checking QuantiFERON for medication monitoring on Saphnelo   Chronic venous insufficiency of lower extremity - Consider referral to a lymphedema specialist if symptoms persist. Chronic leg swelling with episodic purple discoloration of toes. CT scan results pending for vascular obstruction evaluation. Potential causes include post-thrombotic syndrome and endothelial dysfunction related to lupus. - Await CT scan results. - Continue compression therapy as tolerated.  Photosensitivity due to minocycline  Photosensitivity with facial and neck rash and blistering, likely exacerbated by sun exposure. Improvement noted with minocycline  once daily at night. Discussed minocycline  pharmacokinetics and potential skin darkening due to UV exposure.  Joint pain and swelling Intermittent joint pain and swelling, particularly in the hand, with a small cystic swelling. Potential overuse or  osteoarthritis discussed as contributing factors rather than autoimmune causes. - Consider further evaluation if symptoms worsen.    Orders: Orders Placed This Encounter  Procedures   Anti-DNA antibody, double-stranded   CBC with Differential/Platelet   Comprehensive metabolic panel with GFR   QuantiFERON-TB Gold Plus   Meds ordered this encounter  Medications   cyclobenzaprine  (FLEXERIL ) 10 MG tablet    Sig: Take 1 tablet (10 mg total) by mouth 2 (two) times daily as needed.    Dispense:  60 tablet    Refill:  0   minocycline  (MINOCIN ) 100 MG capsule    Sig: Take 1 capsule (100 mg total) by mouth 2 (two) times daily.    Dispense:  60 capsule    Refill:  0     Follow-Up Instructions: Return in about 3 months (around 08/07/2024) for SLE on saphnelo /HCQ f/u 3mos.   Matt Song, MD  Note -  This record has been created using [employer].  Chart creation errors have been sought, but may not always  have been located. Such creation errors do not reflect on  the standard of medical care.

## 2024-04-28 DIAGNOSIS — M329 Systemic lupus erythematosus, unspecified: Secondary | ICD-10-CM | POA: Diagnosis not present

## 2024-04-28 NOTE — Progress Notes (Signed)
 [facility] [place]  ASSESSMENT / PLAN: 43 y.o. female with chronic bilateral lower extremity swelling and discomfort after remote history of DVT. 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 Will check CT venogram of abdomen and pelvis to see if she has iliac disease to explain lower extremity swelling. Counseled patient that there is low likelihood we have an intervention to help her symptoms. Continue compression, elevation, etc.   CHIEF COMPLAINT: leg swelling  HISTORY OF PRESENT ILLNESS: Christy Lowe is a 43 y.o. female who returns to clinic for evaluation of leg swelling.  Patient reports continued leg swelling which is very bothersome to her.  We reviewed her noninvasive testing in detail.  I agree with PA assessment from prior visit.  I do not think there is much we can do to improve symptom management.  I shared this with the patient who is understanding.  The only area not evaluated to date is her pelvis.  Given the unclear anatomic configuration of her DVT in the past, I think it is reasonable to proceed with a CT venogram to see if there is any iliac vein occlusion in hopes that we can intervene to improve her symptoms.  Past Medical History:  Diagnosis Date   aub    Bruises easily 10/24/2021   Complication of anesthesia 10/15/2020   hypotension   Fibromyalgia    GERD (gastroesophageal reflux disease)    Heart murmur  PFO    off 81 mg aspirin since feb 2022 per pcp  no cardiologist   Hemangioma    History of DVT (deep vein thrombosis)    right le 7 yrs ago, right leg slight swollen   History of endometriosis    History of hiatal hernia    Hypotension    with 10-15-2020 endoscopy   Long-term corticosteroid use 02/20/2022   Corticosteroid   Migraine without aura    Systemic lupus erythematosus (HCC)    TIA    ages 62 and 70   Vascular insufficiency    Viral meningitis    age 36    Past Surgical History:  Procedure  Laterality Date   ABDOMINAL HYSTERECTOMY     ANTERIOR CRUCIATE LIGAMENT REPAIR Left 10/2018   CYSTOSCOPY  10/31/2021   Procedure: CYSTOSCOPY;  Surgeon: Wanita Gutta, MD;  Location: [facility];  Service: Gynecology;;   PELVIC LAPAROSCOPY     for endometriosis yrs ago per pt on 10-24-2021   right knee lateral release and meniscal repair  2011   TONSILLECTOMY     age 51, adenoids removed also   TOTAL LAPAROSCOPIC HYSTERECTOMY WITH SALPINGECTOMY Bilateral 10/31/2021   Procedure: TOTAL LAPAROSCOPIC HYSTERECTOMY WITH SALPINGECTOMY;  Surgeon: Wanita Gutta, MD;  Location: [facility];  Service: Gynecology;  Laterality: Bilateral;   UMBILICAL HERNIA REPAIR     yrs ago   UPPER GI ENDOSCOPY     with esophagus stretching 10-15-2020, 03-11-2020, 05-09-2019,  2013 with being polyp removed    Family History  Problem Relation Age of Onset   Alcohol abuse Mother    Cancer Father        Lung and Colon   Heart attack Father    COPD Father    Diabetes Maternal Grandmother    Cervical cancer Maternal Grandmother    Fibromyalgia Maternal Grandmother    Breast cancer Maternal Grandmother        ? age of dx   Prostate cancer Maternal Grandfather  Heart attack Maternal Grandfather    ALS Paternal Grandmother    Prostate cancer Paternal Grandfather    Heart attack Paternal Grandfather    Esophageal cancer Paternal Grandfather    Breast cancer Paternal Aunt        37's   Ovarian cancer Paternal Aunt        59's   Breast cancer Other    Lung cancer Maternal Uncle    Colon cancer Neg Hx    Stomach cancer Neg Hx    Rectal cancer Neg Hx     Social History   Socioeconomic History   Marital status: Single    Spouse name: Not on file   Number of children: Not on file   Years of education: Not on file   Highest education level: Not on file  Occupational History   Not on file  Tobacco Use   Smoking status: Never    Passive exposure: Never    Smokeless tobacco: Never  Vaping Use   Vaping status: Never Used  Substance and Sexual Activity   Alcohol use: Yes    Alcohol/week: 2.0 standard drinks of alcohol    Types: 2 Standard drinks or equivalent per week    Comment: occassionally   Drug use: Never   Sexual activity: Yes    Birth control/protection: Surgical    Comment: partner with vasectomy  Other Topics Concern   Not on file  Social History Narrative   Not on file   Social Drivers of Health   Financial Resource Strain: Not on file  Food Insecurity: Not on file  Transportation Needs: Not on file  Physical Activity: Not on file  Stress: Not on file  Social Connections: Not on file  Intimate Partner Violence: Not on file    Allergies  Allergen Reactions   Cephalexin Hives and Shortness Of Breath   Penicillins Hives and Anaphylaxis    SOB As child    Amitiza [Lubiprostone]     N/v   Sulfa Antibiotics Other (See Comments)    As child Doesn't remember     Current Outpatient Medications  Medication Sig Dispense Refill   Anifrolumab -fnia 300 MG/2ML SOLN Inject 300 mg into the vein every 28 (twenty-eight) days.     cyclobenzaprine  (FLEXERIL ) 10 MG tablet TAKE 1 TABLET BY MOUTH AT BEDTIME AS NEEDED FOR MUSCLE SPASMS. 180 tablet 0   furosemide  (LASIX ) 20 MG tablet TAKE 1 TABLET BY MOUTH EVERY DAY AS NEEDED 90 tablet 0   gabapentin  (NEURONTIN ) 300 MG capsule Take by mouth.     hydroxychloroquine  (PLAQUENIL ) 200 MG tablet Take 2 tablets (400 mg total) by mouth daily. 180 tablet 1   hydrOXYzine  (ATARAX ) 25 MG tablet TAKE 1 TABLET AT NIGHT OR UP TO 2 TIMES DAILY AS NEEDED FOR ITCHING 180 tablet 0   minocycline  (MINOCIN ) 100 MG capsule Take 1 capsule (100 mg total) by mouth 2 (two) times daily. 60 capsule 2   No current facility-administered medications for this visit.    PHYSICAL EXAM Vitals:   04/29/24 1330  BP: 120/82  Pulse: 84  Temp: 98.5 F (36.9 C)  TempSrc: Temporal  SpO2: 97%  Weight: 171 lb 9.6  oz (77.8 kg)  Height: 5\' 7"  (1.702 m)   Well-appearing middle-aged woman in no distress Regular rate and rhythm Unlabored breathing Palpable dorsalis pedis pulses bilaterally Edema of bilateral lower extremities, right worse than left.  Subjectively better today than typical per patient report.  PERTINENT LABORATORY AND RADIOLOGIC DATA  Most  recent CBC    Latest Ref Rng & Units 02/05/2024   11:23 AM 11/07/2023   11:32 AM 08/08/2023   11:06 AM  CBC  WBC 3.8 - 10.8 Thousand/uL 5.5  5.1  6.2   Hemoglobin 11.7 - 15.5 g/dL 16.1  09.6  04.5   Hematocrit 35.0 - 45.0 % 41.3  40.5  40.9   Platelets 140 - 400 Thousand/uL 243  247  272      Most recent CMP    Latest Ref Rng & Units 02/05/2024   11:23 AM 11/07/2023   11:32 AM 08/08/2023   11:06 AM  CMP  Glucose 65 - 99 mg/dL 89  63  78   BUN 7 - 25 mg/dL 15  15  13    Creatinine 0.50 - 0.99 mg/dL 4.09  8.11  9.14   Sodium 135 - 146 mmol/L 139  138  139   Potassium 3.5 - 5.3 mmol/L 4.5  4.4  4.2   Chloride 98 - 110 mmol/L 104  104  104   CO2 20 - 32 mmol/L 27  26  27    Calcium 8.6 - 10.2 mg/dL 9.9  9.5  9.4   Total Protein 6.1 - 8.1 g/dL 7.1  6.7  7.0   Total Bilirubin 0.2 - 1.2 mg/dL 0.4  0.3  0.5   AST 10 - 30 U/L 20  22  18    ALT 6 - 29 U/L 13  16  12      Renal function CrCl cannot be calculated (Patient's most recent lab result is older than the maximum 21 days allowed.).  Hgb A1c MFr Bld (%)  Date Value  05/23/2022 5.5    LDL Cholesterol (Calc)  Date Value Ref Range Status  04/04/2019 120 (H) mg/dL (calc) Final    Comment:    Reference range: <100 . Desirable range <100 mg/dL for primary prevention;   <70 mg/dL for patients with CHD or diabetic patients  with > or = 2 CHD risk factors. Aaron Aas LDL-C is now calculated using the Martin-Hopkins  calculation, which is a validated novel method providing  better accuracy than the Friedewald equation in the  estimation of LDL-C.  Melinda Sprawls et al. Erroll Heard. 7829;562(13): 2061-2068   (http://education.[facility].com/faq/FAQ164)     Heber Little. Edgardo Goodwill, MD FACS [facility] Phone Number: [phone] 04/29/2024 4:41 PM   Total time spent on preparing this encounter including chart review, data review, collecting history, examining the patient, and coordinating care: 40 minutes.   Portions of this report may have been transcribed using voice recognition software.  Every effort has been made to ensure accuracy; however, inadvertent computerized transcription errors may still be present.

## 2024-04-29 ENCOUNTER — Encounter: Payer: Self-pay | Admitting: Vascular Surgery

## 2024-04-29 ENCOUNTER — Ambulatory Visit: Attending: Vascular Surgery | Admitting: Vascular Surgery

## 2024-04-29 ENCOUNTER — Telehealth: Payer: Self-pay | Admitting: Pharmacist

## 2024-04-29 VITALS — BP 120/82 | HR 84 | Temp 98.5°F | Ht 67.0 in | Wt 171.6 lb

## 2024-04-29 DIAGNOSIS — I872 Venous insufficiency (chronic) (peripheral): Secondary | ICD-10-CM

## 2024-04-29 DIAGNOSIS — I87009 Postthrombotic syndrome without complications of unspecified extremity: Secondary | ICD-10-CM

## 2024-04-29 NOTE — Telephone Encounter (Signed)
 Received fax from [facility] regarding Saphnelo  IV [phone])  infusion that patient received on 04/28/2024. Dose: 300mg  Labs were not drawn.  Patient tolerated infusion without complications.  Changes/concerns since last visit: pt states her infusion is not lasting more than 1.5 weeks after appointment  Next Saphnelo  infusion scheduled for 05/26/2024. MyChart message sent to patient if she will still be travelling to [place] from 05/20/24 through 06/15/2024  Geraldene Kleine, PharmD, MPH, BCPS, CPP Clinical Pharmacist (Rheumatology and Pulmonology)

## 2024-05-07 ENCOUNTER — Telehealth: Payer: Self-pay | Admitting: Pharmacist

## 2024-05-07 ENCOUNTER — Ambulatory Visit
Admission: RE | Admit: 2024-05-07 | Discharge: 2024-05-07 | Disposition: A | Discharge status: Home / Self Care | Source: Ambulatory Visit | Attending: Vascular Surgery | Admitting: Vascular Surgery

## 2024-05-07 ENCOUNTER — Ambulatory Visit: Attending: Internal Medicine | Admitting: Internal Medicine

## 2024-05-07 ENCOUNTER — Encounter: Payer: Self-pay | Admitting: Internal Medicine

## 2024-05-07 VITALS — BP 118/79 | HR 76 | Resp 14 | Ht 67.0 in | Wt 170.0 lb

## 2024-05-07 DIAGNOSIS — R21 Rash and other nonspecific skin eruption: Secondary | ICD-10-CM

## 2024-05-07 DIAGNOSIS — I872 Venous insufficiency (chronic) (peripheral): Secondary | ICD-10-CM

## 2024-05-07 DIAGNOSIS — M329 Systemic lupus erythematosus, unspecified: Secondary | ICD-10-CM

## 2024-05-07 DIAGNOSIS — Z79899 Other long term (current) drug therapy: Secondary | ICD-10-CM

## 2024-05-07 DIAGNOSIS — I87009 Postthrombotic syndrome without complications of unspecified extremity: Secondary | ICD-10-CM

## 2024-05-07 MED ORDER — IOPAMIDOL (ISOVUE-370) INJECTION 76%
125.0000 mL | Freq: Once | INTRAVENOUS | Status: AC | PRN
  Administered 2024-05-07: 125 mL via INTRAVENOUS

## 2024-05-07 MED ORDER — MINOCYCLINE HCL 100 MG PO CAPS: 100.0000 mg | ORAL_CAPSULE | Freq: Two times a day (BID) | ORAL | 0 refills | Status: DC

## 2024-05-07 MED ORDER — CYCLOBENZAPRINE HCL 10 MG PO TABS: 10.0000 mg | ORAL_TABLET | Freq: Two times a day (BID) | ORAL | 0 refills | Status: DC | PRN

## 2024-05-07 NOTE — Telephone Encounter (Signed)
 Patient confirmed via MyChart that she will be in [place] for 05/26/2024 infusion

## 2024-05-07 NOTE — Telephone Encounter (Signed)
 Patient confirmed via MyChart that she will be in [place] for 05/26/2024 infusion. Saphnelo  orders faxed to [facility]  Fax: [phone] Phone: [phone]  Geraldene Kleine, PharmD, MPH, BCPS, CPP Clinical Pharmacist (Rheumatology and Pulmonology)

## 2024-05-08 NOTE — Telephone Encounter (Addendum)
 Received fax from [facility]. Referral for Saphnelo  infusion has been received

## 2024-05-09 NOTE — Telephone Encounter (Signed)
 Pre-certification form for Saphnelo  ([id number]) faxed to [facility] with clinicals for new site of care  Phone: [phone] Fax: [phone]  Geraldene Kleine, PharmD, MPH, BCPS, CPP Clinical Pharmacist (Rheumatology and Pulmonology)

## 2024-05-11 LAB — CBC WITH DIFFERENTIAL/PLATELET
Absolute Lymphocytes: 1638 {cells}/uL (ref 850–3900)
Absolute Monocytes: 399 {cells}/uL (ref 200–950)
Basophils Absolute: 42 {cells}/uL (ref 0–200)
Basophils Relative: 1 %
Eosinophils Absolute: 71 {cells}/uL (ref 15–500)
Eosinophils Relative: 1.7 %
HCT: 44.1 % (ref 35.0–45.0)
Hemoglobin: 14.3 g/dL (ref 11.7–15.5)
MCH: 29.9 pg (ref 27.0–33.0)
MCHC: 32.4 g/dL (ref 32.0–36.0)
MCV: 92.3 fL (ref 80.0–100.0)
MPV: 9.5 fL (ref 7.5–12.5)
Monocytes Relative: 9.5 %
Neutro Abs: 2050 {cells}/uL (ref 1500–7800)
Neutrophils Relative %: 48.8 %
Platelets: 254 10*3/uL (ref 140–400)
RBC: 4.78 10*6/uL (ref 3.80–5.10)
RDW: 12.4 % (ref 11.0–15.0)
Total Lymphocyte: 39 %
WBC: 4.2 10*3/uL (ref 3.8–10.8)

## 2024-05-11 LAB — COMPREHENSIVE METABOLIC PANEL WITH GFR
AG Ratio: 1.9 (calc) (ref 1.0–2.5)
ALT: 13 U/L (ref 6–29)
AST: 22 U/L (ref 10–30)
Albumin: 4.6 g/dL (ref 3.6–5.1)
Alkaline phosphatase (APISO): 49 U/L (ref 31–125)
BUN: 15 mg/dL (ref 7–25)
CO2: 27 mmol/L (ref 20–32)
Calcium: 9.9 mg/dL (ref 8.6–10.2)
Chloride: 100 mmol/L (ref 98–110)
Creat: 0.84 mg/dL (ref 0.50–0.99)
Globulin: 2.4 g/dL (ref 1.9–3.7)
Glucose, Bld: 81 mg/dL (ref 65–99)
Potassium: 4.4 mmol/L (ref 3.5–5.3)
Sodium: 138 mmol/L (ref 135–146)
Total Bilirubin: 0.5 mg/dL (ref 0.2–1.2)
Total Protein: 7 g/dL (ref 6.1–8.1)
eGFR: 90 mL/min/{1.73_m2} (ref >=60)

## 2024-05-11 LAB — QUANTIFERON-TB GOLD PLUS
Mitogen-NIL: >10 [IU]/mL
NIL: 0.03 [IU]/mL
QuantiFERON-TB Gold Plus: NEGATIVE
TB1-NIL: 0 [IU]/mL
TB2-NIL: 0.01 [IU]/mL

## 2024-05-11 LAB — ANTI-DNA ANTIBODY, DOUBLE-STRANDED: ds DNA Ab: 13 [IU]/mL — ABNORMAL HIGH

## 2024-05-12 ENCOUNTER — Ambulatory Visit: Payer: Self-pay | Admitting: Internal Medicine

## 2024-05-12 NOTE — Telephone Encounter (Signed)
 Referral to [facility] placed for one-time infusion ideally on 05/31/24 @ [place] location. Patient ok with switching to [facility] location for future infusions thereafter  [place] Fax: [phone] [facility] Fax: [phone]  Geraldene Kleine, PharmD, MPH, BCPS, CPP Clinical Pharmacist (Rheumatology and Pulmonology)

## 2024-05-12 NOTE — Telephone Encounter (Signed)
 Received call from patient with insurance on line. 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  Patient states she is willing to receive infusion on 05/31/2024 in [place]. [facility] in [place] is open on Saturday 05/31/2024 and [place] locations are not. Spoke with patient and she is in agreement to receive at [facility] in [facility], PharmD, MPH, BCPS, CPP Clinical Pharmacist (Rheumatology and Pulmonology)

## 2024-05-12 NOTE — Telephone Encounter (Signed)
 Received fax from [place] that [phone] Saphnelo  infusions have been denied because the rendering provider is out of network. Please contact insurance to determine if there are any in-network infusion sites in [place]  [place], [place]  Christy Lowe, PharmD, MPH, BCPS, CPP Clinical Pharmacist (Rheumatology and Pulmonology)

## 2024-05-12 NOTE — Progress Notes (Signed)
 Lab results look good, no problems with the new medication. dsDNA remains low positive at 13 about the same as before. Blood count and metabolic panel are normal.

## 2024-05-13 NOTE — Telephone Encounter (Signed)
 Referral to [facility] placed for one-time infusion ideally on 05/31/24 @ [place] location. Patient ok with switching to [facility] location for future infusions thereafter  [place] Fax: [phone] [facility] Fax: [phone]  Geraldene Kleine, PharmD, MPH, BCPS, CPP Clinical Pharmacist (Rheumatology and Pulmonology)

## 2024-05-13 NOTE — Telephone Encounter (Signed)
 Pre-certification form for Saphnelo  [phone]) faxed to [facility] with clinicals for [facility]   Phone: [phone] Fax: [phone]   Geraldene Kleine, PharmD, MPH, BCPS, CPP Clinical Pharmacist (Rheumatology and Pulmonology)

## 2024-05-14 NOTE — Telephone Encounter (Signed)
 Received fax from [place]. Saphnelo  [id number] is APPROVED from 05/13/2024 through 05/12/2025 at [facility]  Authorization letter faxed to [facility]  Patient notified via MyChart  Geraldene Kleine, PharmD, MPH, BCPS, CPP Clinical Pharmacist (Rheumatology and Pulmonology)

## 2024-05-15 NOTE — Telephone Encounter (Signed)
 Received fax from [facility] that patient is scheduled for Saphnelo  infusion at [facility] location on 05/31/24 @ 11am  Geraldene Kleine, PharmD, MPH, BCPS, CPP Clinical Pharmacist (Rheumatology and Pulmonology)

## 2024-05-31 DIAGNOSIS — M329 Systemic lupus erythematosus, unspecified: Secondary | ICD-10-CM | POA: Diagnosis not present

## 2024-06-08 ENCOUNTER — Other Ambulatory Visit: Payer: Self-pay | Admitting: Internal Medicine

## 2024-06-08 DIAGNOSIS — M329 Systemic lupus erythematosus, unspecified: Secondary | ICD-10-CM

## 2024-06-08 NOTE — Telephone Encounter (Signed)
 Last Fill: 05/07/2024  Next Visit: 08/06/2024  Last Visit: 611/2025  Dx: Systemic lupus erythematosus, unspecified SLE type   Current Dose per office note on 05/07/2024:   cyclobenzaprine  (FLEXERIL ) 10 MG tablet       Sig: Take 1 tablet (10 mg total) by mouth 2 (two) times daily as needed.    Okay to refill Flexeril ?

## 2024-06-17 DIAGNOSIS — Z8673 Personal history of transient ischemic attack (TIA), and cerebral infarction without residual deficits: Secondary | ICD-10-CM | POA: Diagnosis not present

## 2024-06-17 DIAGNOSIS — Q2112 Patent foramen ovale: Secondary | ICD-10-CM | POA: Diagnosis not present

## 2024-06-18 ENCOUNTER — Ambulatory Visit: Attending: Vascular Surgery | Admitting: Vascular Surgery

## 2024-06-18 DIAGNOSIS — I87009 Postthrombotic syndrome without complications of unspecified extremity: Secondary | ICD-10-CM

## 2024-06-18 NOTE — Progress Notes (Signed)
 Discussed CT results with patient via telephone. Unfortunately, no clear explanation for right sided lower extremity swelling.  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  I explained to her that an intervention would not likely help her symptoms.  She is understanding.  She can follow-up with me as needed.  Debby SAILOR. Magda, MD [facility] Phone Number: [phone] 06/18/2024 12:10 PM

## 2024-06-27 DIAGNOSIS — M329 Systemic lupus erythematosus, unspecified: Secondary | ICD-10-CM | POA: Diagnosis not present

## 2024-07-12 ENCOUNTER — Other Ambulatory Visit: Payer: Self-pay | Admitting: Internal Medicine

## 2024-07-12 DIAGNOSIS — R21 Rash and other nonspecific skin eruption: Secondary | ICD-10-CM

## 2024-07-14 NOTE — Telephone Encounter (Signed)
 Last Fill: 05/07/2024 (30 day supply)  Labs: 05/07/2024 Lab results look good, no problems with the new medication. dsDNA remains low positive at 13 about the same as before. Blood count and metabolic panel are normal.   Next Visit: 08/06/2024  Last Visit: 05/07/2024  DX: Systemic lupus erythematosus, unspecified SLE type, unspecified organ involvement status   Current Dose per office note 05/07/2024: minocycline  100 mg currently tolerated at once daily   Okay to refill Minocycline ?

## 2024-07-23 NOTE — Progress Notes (Signed)
 Office Visit Note  Patient: Christy Lowe             Date of Birth: Jun 03, 1981           MRN: [id number]             PCP: Purcell Emil Schanz, MD Referring: Purcell Emil Schanz, * Visit Date: 08/06/2024   Subjective:  Lupus    Discussed the use of AI scribe software for clinical note transcription with the patient, who gave verbal consent to proceed.  History of Present Illness   Christy Lowe is a 43 y.o. female here for follow up for SLE on saphnelo  300 mg IV monthly and HCQ 400 mg daily.    She has ongoing skin issues, including a rash on her hip, leg, and arm, described as blistery but not itchy. Urgent care determined it was not shingles and recommended a mixture of calamine lotion and bacitracin, which helped dry up the rash. She also has persistent skin issues on her fingers, which she picks at, leading to burning sensations.  She experiences recurrent eye infections characterized by bright red eyes and significant discharge. Moxifloxacin drops clear up the symptoms within a day. The infections often occur while traveling, and she suspects an allergic reaction to redness relief drops may have contributed. She keeps moxifloxacin drops readily available to manage these episodes.  She is currently on minocycline  100 mg once daily, which was initially effective but its current efficacy is uncertain. She also receives Saphnelo  infusions, which she feels may not be as effective if administered too quickly. She has noticed skin darkening, which she attributes to sun exposure and possibly medication side effects.  She reports swelling in her leg and pain in her finger joints, particularly the right hand's second and fourth MCP joints. Persistent swelling in one leg has been evaluated by a vascular doctor without a clear diagnosis. She experiences pain in her finger joints and persistent leg swelling.  She uses hydroxyzine  to manage itching, which has been effective. She has  a persistent bruise under her arm that has been present for three months and is painful when pressure is applied. No sinus congestion, cold, or flu-like symptoms. No significant swelling of lymph nodes.       Previous HPI 05/07/2024 Christy Lowe is a 43 y.o. female here for follow up for SLE on saphnelo  300 mg IV monthly and HCQ 400 mg daily.  She presents with concerns about medication side effects and persistent symptoms.   She has a persistent rash on her face and neck, attributed to sun exposure, causing small blisters. She has been taking minocycline , initially prescribed at one pill twice a day, but due to queasiness, she is only taking one pill at night. Despite the reduced dosage, there is improvement in the rash especially around the edges of the face and sides of neck.   Her current lupus medication, Saphnelo , is not working as effectively as before. She experienced better results when the infusion was administered more slowly during a previous session. Recently, she has been experiencing pain earlier in the month than usual, which typically occurs closer to her infusion date.  Some may be seeing a very good response between 2 to 3 weeks.   She describes a new symptom of her left eyelid getting 'stuck' intermittently without associated pain or irritation. This occurs randomly and is not related to the time of day or fatigue.   She experiences right leg swelling, particularly  in the morning, which worsens throughout the day. She recently underwent a CT scan to check for any blockages, as previous ultrasounds were inconclusive.   She experiences pain in her hand, particularly when gripping, and notes a grinding sensation.   Previous HPI 02/05/2024 Christy Lowe is a 43 y.o. female here for follow up for SLE on saphnelo  300 mg IV monthly and HCQ 400 mg daily.   She experiences joint swelling and pain, particularly in the morning, which improves after receiving her regular  medication, Saphnelo . However, the relief from this medication seems to be diminishing over time. She also takes hydroxychloroquine , cyclobenzaprine , and gabapentin .   She has ongoing skin issues characterized by blister-like lesions that resemble 'poison ivy blisters' or 'water blisters'. These lesions are not itchy but burn and take weeks to heal. They are located on her back, chest, and face, particularly around the hairline and neck. She has tried various topical treatments without improvement. She was previously misdiagnosed with MS and later prescribed Accutane, which her insurance denied. She has not been able to start this treatment and has not heard back from the prescribing physician's office.   She has swelling in her right leg, which is larger than the left by about two and a half inches by the end of the day. She has tried compression socks and Lasix  without significant improvement. The swelling is persistent and does not respond well to these treatments.   She has a history of a hysterectomy and reports that some of her symptoms have improved since the surgery. She has not been sick recently and has not taken antibiotics.        Previous HPI 11/07/23 Christy Lowe is a 43 y.o. female here for follow up for systemic lupus on hydroxychloroquine  400 mg daily, Saphnelo  300 mg IV monthly.  She presents with persistent itching and the development of water-filled blisters on the face, neck, and chest. The itching is described as severe, with the patient noting that it feels like they want to scratch their skin off. The itching and blistering are temporarily relieved following their regular infusions, but symptoms return in the week leading up to the next scheduled infusion.   The patient also reports cold, tingling sensations in their toes, and severe heel pain when in contact with the bed. They note that these symptoms also improve following their infusions but return as the effects of  the infusion wear off.   In addition to these symptoms, the patient has been experiencing joint stiffness, which also worsens in the week leading up to their infusion. They have a history of transient ischemic attacks (TIAs), but have not experienced one recently.   The patient has been on a regimen of gabapentin  and hydroxyzine  to manage their symptoms. They report that the gabapentin  has been helpful in managing the itching, and they continue to take hydroxyzine  at night to prevent severe itching episodes.   The patient has a history of high D-dimer levels and has been advised to take a daily aspirin. They also have hemangiomas throughout their body on imaging. Despite these findings, extensive testing has not revealed any blood clots.   The patient's symptoms and their trajectory have led to multiple diagnoses, including MS and lupus. However, the patient expresses frustration with the lack of a definitive diagnosis and treatment plan. They are seeking a solution to alleviate their persistent itching and blistering, as well as their other symptoms. She was recommended to have screening for antiphospholipid  syndrome per neurologist, who otherwise found age advanced degenerative change on MRI but no inflammatory lesions.     Previous HPI 08/08/2023 Christy Lowe is a 43 y.o. female here for follow up for systemic lupus on hydroxychloroquine  400 mg daily, Saphnelo  300 mg IV monthly.  Last treatment infusion was on August 28 still noticing a improvement immediately afterwards but with some decline currently has active symptoms again only 2 weeks later.  She did have a recent work trip to [place].  Experienced some increased leg swelling after plane flight despite use of compressive sock and taking Lasix .  Still has the active rash with itching and bumps that appear fluid-filled and subsequently unroofed by scratching along the jawline and sides of the neck.  Has experienced a few more painful ulcers  most often on the roof of the mouth.   She saw Dr. Jorizzo for evaluation August 20. He did not see evidence consistent for active cutaneous lupus.  Recommended neurology consultation for suspected MS. previously saw local neurology with negative antibody testing and normal nerve conduction study had not recommended any continued treatment there was treated for several years previously.  Current appointment for this now scheduled in January.   Previous HPI 05/15/2023 Christy Lowe is a 43 y.o. female here for follow up for systemic lupus on hydroxychloroquine  400 mg daily, Saphnelo  300 mg IV monthly.  No new severe disease flareup.  She is seeing definite benefit with her infusions but notices some waning efficacy especially in the last week between dosing this was noticeable she thinks the last 4 treatments.  Also having issue with persistent skin problems legs are very itchy without visible rashes.  She is trying multiple medications for this including Allegra and the hydroxyzine  with neurologist partially helpful.  Had a recent sinus infection but with prolonged course had x-ray concerning for left upper lung infiltrate so was treated with a Z-Pak.  No other infections or antibiotics since her last visit.   Previous HPI 02/19/23 Christy Lowe is a 43 y.o. female here for follow up for SLE on hydroxychloroquine  400 mg daily and Saphnelo  300 mg IV monthly.  Symptoms with arthritis, rashes, mucosal ulcers, pedal edema.  Since her last visit is recently started to do a lot worse with biggest complaint of increased swelling.  She has been noticing discoloration and puffiness especially in the fingers on both hands.  Also somewhat more leg swelling.  She has tried taking the Lasix  and not seeing a large increase in your output nor any improvement in the leg swelling.  She has been having what feels like UTIs about monthly.  Predominantly urinary urgency and frequency no other associated symptoms.  Skin  rashes have been ongoing around the neck and jaw area about the same as before.  Does not see discrete joint swelling so much as the overall puffiness but does have increased pain and stiffness in both hands. Sometimes going numb and particularly has trouble gripping tightly cannot seem to exert a normal amount of force.    Previous HPI 11/07/22 Christy Lowe is a 43 y.o. female here for follow up for SLE on HCQ 400 mg daily and saphnelo  300 mg IV monthly. Symptoms including arthritis, rashes, mucosal ulcers and pedal edema.  She is noticing a good benefit with the Saphnelo  infusions but describes pain in her joints and generalized sensitivity gets worse again by at least 1 week before each next treatment is due.  Noticing a few new  areas of swelling of her hands and skin discoloration on her hands the rashes around her head and neck are about the same.  She had recent evaluation at [facility] with no superficial veins amenable to procedure intervention identified.  The swelling is becoming faster to develop and somewhat more extensive she notices mild discoloration around the lower shin and ankle described a bluish hue when very swollen.  She quit her job due to these problems knowing that the stress of working continuous 12-hour shifts was worsening symptoms and having to spend some many months in [place]  or travel throughout the country was very problematic for maintaining medication access.   Previous HPI 08/09/22 Christy Lowe is a 43 y.o. female here for follow up for lupus on HCQ 400 mg daily and saphnelo  300 mg IV monthly.  Her joint symptoms have remained pretty well controlled after completely tapering off the prednisone .  She is noticing a difference in symptoms after the Saphnelo  infusion versus 4 weeks later proceeding her next dose.  Continues to have very little appetite but not seeing any weight reduction.  Skin itching especially on her upper legs is a persistent issue she is using topical  emollients with partial benefit.  More recently she is also experiencing an increase in urinary frequency both daytime and increased nocturia.  No visible blood in urine or dysuria.  She is currently anticipating an upcoming plastic surgery needing adjustments for perioperative drug management.   Previous HPI 05/01/2022 Christy Lowe is a 43 y.o. female here for follow up for lupus on HCQ 400 mg daily started saphnelo  infusion and prednisone  tapering down to 2.5 mg daily now. So far she is noticing some improvement with overall severity of skin redness swelling and itching but definitely not resolved.  Face and chest continue having the most erythema.  She has noticed some bruising without any specific cause.  She is noticing some swelling or otherwise thickening in the joints of her right hand and around the right wrist.   Previous HPI 04/06/2022 Christy Lowe is a 43 y.o. female here for follow up for lupus on HCQ 400 mg daily started saphnelo  infusion last month and prednisone  tapering down to 2.5 mg daily now. She felt a large improvement after her first treatment with decreased joint pains and decreased abdominal pain. She is tapering off the prednisone  without severe problem. She has noticed oral ulcers on the top of her mouth for several days and is having a rash with small papules on both sides of her neck and lower face. Appetite is decreased but not losing any weight. She feels a lot of stress after dealing with work incident.   Previous HPI 03/06/2022 Christy Lowe is a 43 y.o. female here for follow up for lupus on HCQ 400 mg daily and prednisone  tapering down to 10 mg daily after increase to 30 last month from flare up with mouth ulcers and diffuse rashes. She had several complications with high dose prednisone  increased weight, mood irritability, and wound dehiscence af hysterectomy cuff site. We discussed plan to start saphenlo infusion as a replacement for benlysta  treatment which  did not improve symptoms or lab abnormalities much after about 6 months treatment.  She has some improvement in the mouth and lip ulcers she is not sure whether starting the valacyclovir  about 4 days ago has had significant effect.  She completed the course of oral antibiotics abdominal pain is partially better. She is scheduled for first saphnelo  infusion  Thursday and has OBGYN followup later this week.     Previous HPI 12/13/21 Christy Lowe is a 43 y.o. female here for follow up for systemic lupus with ongoing joint pains and skin rash on HCQ 400 mg daily, Benlysta  200 mg [place] weekly, and prednisone  10 mg daily. Since our last visit she went for hysterectomy this was uneventful and has recovered well. She was off the benlysta  for about 1 month in total around the surgery. She has not felt much improvement so far still having a lot of fatigue, skin rash and flushing, joint pain in several areas with left wrist swelling. Headaches are about the same as before but now having recurrent episodes of vertigo. She is now taking hydroxyzine  twice on some days due to persistent itching, and feels excessively hot most of the time. She Is back to taking 10 mg of prednisone  when trying to drop this to 5 mg felt extreme fatigue and back pain that felt like bruising and stiffness all over her body.   Previous HPI 11/17/20 Christy Lowe is a 43 y.o. female here for evaluation of positive ANA. She is noticing multiple symptoms including generalized alopecia, oral dryness, left sided neck swelling, joint pain and stiffness, position tremor, and episodic skin rashes. She has had multiple ongoing problems since many years ago. She was hospitalized for viral meningitis at age 45 and feels she has been susceptible to infections ever since this time or before, with numerous cases of bronchitis and has had some lung nodules on CT imaging during these repeat episodes. About 7 years ago she suffered from a RLE DVT provoked  after right knee arthroscopy and developed right sided face and arm numbness. Workup indicated she had a TIA also noted to have PFO. She takes ASA since that time and has taken anticoagulation in perioperative periods. Workup with MRI and LP studies were apparently also indicative for MS. She was treated with multiple rounds of steroids for months at a time and felt a lot of mood disturbance from these. She was never started on other immunomodulatory or immunosuppressive treatments.   Review of Systems  Constitutional:  Negative for fatigue.  HENT:  Positive for mouth sores. Negative for mouth dryness.   Eyes:  Negative for dryness.  Respiratory:  Negative for shortness of breath.   Cardiovascular:  Negative for chest pain and palpitations.  Gastrointestinal:  Negative for blood in stool, constipation and diarrhea.  Endocrine: Negative for increased urination.  Genitourinary:  Negative for involuntary urination.  Musculoskeletal:  Positive for joint pain, joint pain, joint swelling, myalgias, morning stiffness and myalgias. Negative for gait problem, muscle weakness and muscle tenderness.  Skin:  Positive for color change, rash and sensitivity to sunlight. Negative for hair loss.  Allergic/Immunologic: Positive for susceptible to infections.  Neurological:  Negative for dizziness and headaches.  Hematological:  Negative for swollen glands.  Psychiatric/Behavioral:  Negative for depressed mood and sleep disturbance. The patient is nervous/anxious.     PMFS History:  Patient Active Problem List   Diagnosis Date Noted   Vitamin D  deficiency 11/07/2023   Localized swelling of right lower leg 11/01/2022   Chronic venous insufficiency of lower extremity 11/01/2022   Hyperglycemia 05/23/2022   Obesity without serious comorbidity 05/23/2022   Rash and other nonspecific skin eruption 04/06/2022   Leukocytosis 03/06/2022   Redness 11/14/2021   Blurry vision 11/07/2021   Hot flash not due to  menopause 11/04/2021   S/P laparoscopic hysterectomy 10/31/2021  Urinary urgency 09/09/2021   Itching 09/09/2021   High risk medication use 07/25/2021   Finger joint swelling, left 04/01/2021   Systemic lupus erythematosus (HCC) 04/01/2021   Fibromyalgia 04/01/2021   Alopecia 11/17/2020   Other fatigue 10/12/2020   Migraine without aura    History of DVT (deep vein thrombosis)    History of endometriosis    Right-sided muscle weakness 05/15/2019   Abnormal finding on MRI of brain 05/15/2019   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   Tremor 12/24/2017    Past Medical History:  Diagnosis Date   aub    Bruises easily 10/24/2021   Complication of anesthesia 10/15/2020   hypotension   Fibromyalgia    GERD (gastroesophageal reflux disease)    Heart murmur  PFO    off 81 mg aspirin since feb 2022 per pcp  no cardiologist   Hemangioma    History of DVT (deep vein thrombosis)    right le 7 yrs ago, right leg slight swollen   History of endometriosis    History of hiatal hernia    Hypotension    with 10-15-2020 endoscopy   Long-term corticosteroid use 02/20/2022   Corticosteroid   Migraine without aura    PFO (patent foramen ovale)    Systemic lupus erythematosus (HCC)    TIA    ages 19 and 18   Vascular insufficiency    Viral meningitis    age 15    Family History  Problem Relation Age of Onset   Alcohol abuse Mother    Cancer Father        Lung and Colon   Heart attack Father    COPD Father    Diabetes Maternal Grandmother    Cervical cancer Maternal Grandmother    Fibromyalgia Maternal Grandmother    Breast cancer Maternal Grandmother        ? age of dx   Prostate cancer Maternal Grandfather    Heart attack Maternal Grandfather    ALS Paternal Grandmother    Prostate cancer Paternal Grandfather    Heart attack Paternal Grandfather    Esophageal cancer Paternal Grandfather    Breast cancer Paternal Aunt        36's   Ovarian cancer Paternal Aunt        48's    Breast cancer Other    Lung cancer Maternal Uncle    Colon cancer Neg Hx    Stomach cancer Neg Hx    Rectal cancer Neg Hx    Past Surgical History:  Procedure Laterality Date   ABDOMINAL HYSTERECTOMY     ANTERIOR CRUCIATE LIGAMENT REPAIR Left 10/2018   CYSTOSCOPY  10/31/2021   Procedure: CYSTOSCOPY;  Surgeon: Jannis Kate Norris, MD;  Location: [facility];  Service: Gynecology;;   PELVIC LAPAROSCOPY     for endometriosis yrs ago per pt on 10-24-2021   right knee lateral release and meniscal repair  2011   TONSILLECTOMY     age 89, adenoids removed also   TOTAL LAPAROSCOPIC HYSTERECTOMY WITH SALPINGECTOMY Bilateral 10/31/2021   Procedure: TOTAL LAPAROSCOPIC HYSTERECTOMY WITH SALPINGECTOMY;  Surgeon: Jannis Kate Norris, MD;  Location: [facility];  Service: Gynecology;  Laterality: Bilateral;   UMBILICAL HERNIA REPAIR     yrs ago   UPPER GI ENDOSCOPY     with esophagus stretching 10-15-2020, 03-11-2020, 05-09-2019,  2013 with being polyp removed   Social History   Social History Narrative   Not on file   Immunization History  Administered Date(s) Administered   PFIZER(Purple Top)SARS-COV-2 Vaccination 06/27/2020, 07/28/2020   PPD Test 01/23/2019     Objective: Vital Signs: BP 129/77   Pulse 89   Temp 98.5 F (36.9 C)   Resp 14   Ht 5' 7 (1.702 m)   Wt 171 lb 9.6 oz (77.8 kg)   LMP 10/10/2021   BMI 26.88 kg/m    Physical Exam Eyes:     Conjunctiva/sclera: Conjunctivae normal.  Cardiovascular:     Rate and Rhythm: Normal rate and regular rhythm.  Pulmonary:     Effort: Pulmonary effort is normal.     Breath sounds: Normal breath sounds.  Lymphadenopathy:     Cervical: No cervical adenopathy.  Skin:    General: Skin is warm and dry.     Findings: Rash present.     Comments: Scattered rash along the base of the jawline with a few unroofed excoriated papules No pitting edema or overlying skin rash No digital pitting    Neurological:     Mental Status: She is alert.  Psychiatric:        Mood and Affect: Mood normal.      Musculoskeletal Exam:  Shoulders full ROM no tenderness or swelling Elbows full ROM no tenderness or swelling Wrists full ROM no tenderness or swelling Fingers full ROM, right second MCP tenderness to pressure Knees full ROM no tenderness or swelling Ankles full ROM no tenderness or swelling  Investigation: No additional findings.  Imaging: No results found.  Recent Labs: Lab Results  Component Value Date   WBC 5.1 08/06/2024   HGB 14.0 08/06/2024   PLT 246 08/06/2024   NA 137 08/06/2024   K 4.4 08/06/2024   CL 103 08/06/2024   CO2 26 08/06/2024   GLUCOSE 87 08/06/2024   BUN 15 08/06/2024   CREATININE 1.01 (H) 08/06/2024   BILITOT 0.4 08/06/2024   ALKPHOS 47 10/26/2021   AST 22 08/06/2024   ALT 16 08/06/2024   PROT 7.4 08/06/2024   ALBUMIN 4.9 10/26/2021   CALCIUM 9.9 08/06/2024   GFRAA >60 03/10/2020   QFTBGOLDPLUS NEGATIVE 05/07/2024    Speciality Comments: PLQ Eye Exam [facility] 01/10/2024 WNL Follow-up: 12 months Appt 12/01/2023 per patient  Patient requests 90 day prescriptions  Procedures:  No procedures performed Allergies: Cephalexin, Penicillins, Sulfa antibiotics, and Lubiprostone   Assessment / Plan:     Visit Diagnoses: Systemic lupus erythematosus, unspecified SLE type, unspecified organ involvement status (HCC) - Plan: hydroxychloroquine  (PLAQUENIL ) 200 MG tablet, cyclobenzaprine  (FLEXERIL ) 10 MG tablet, Anti-DNA antibody, double-stranded Chronic dermatitis with unusual distribution and antibiotic responsiveness. Differential includes atopic dermatitis and immune involvement. Minocycline  may cause skin darkening; benefit uncertain. Saphnelo 's effectiveness may be waning. - Discontinue minocycline  and monitor skin condition. - Check immunoglobulin titers. - Continue Saphnelo  infusions 300 mg IV monthly - Continue  hydroxychloroquine  400 mg daily x5days half dose 2 days/week - Continue flexeril  10 mg and gabapentin  for MSK pain   High risk medication use - hydroxychloroquine  400 mg daily, PLQ Eye Exam [facility] 01/10/2024 WNL - Plan: CBC with Differential/Platelet, Comprehensive metabolic panel with GFR, IgG, IgA, IgM - Checking CBC and CMP for medication monitoring continue long-term use of Saphnelo  hydroxychloroquine  and minocycline  - Checking QuantiFERON for medication monitoring on Saphnelo   Recurrent conjunctivitis Recurrent conjunctivitis with rapid resolution using moxifloxacin, suggesting bacterial involvement despite possible allergic etiology. - Use moxifloxacin drops as needed.  Chronic venous insufficiency of right lower extremity Chronic right leg swelling with no clear etiology despite  evaluation. No tissue damage or circulation issues observed.        Orders: Orders Placed This Encounter  Procedures   CBC with Differential/Platelet   Comprehensive metabolic panel with GFR   IgG, IgA, IgM   Anti-DNA antibody, double-stranded   Meds ordered this encounter  Medications   hydroxychloroquine  (PLAQUENIL ) 200 MG tablet    Sig: Take 2 tablets (400 mg total) by mouth daily.    Dispense:  180 tablet    Refill:  0   cyclobenzaprine  (FLEXERIL ) 10 MG tablet    Sig: Take 1 tablet (10 mg total) by mouth 2 (two) times daily as needed for muscle spasms.    Dispense:  180 tablet    Refill:  0     Follow-Up Instructions: Return in about 3 months (around 11/05/2024) for SLE on SAPH/HCQ f/u 3mos.   Lonni LELON Ester, MD  Note - This record has been created using [employer].  Chart creation errors have been sought, but may not always  have been located. Such creation errors do not reflect on  the standard of medical care.

## 2024-07-28 ENCOUNTER — Other Ambulatory Visit: Payer: Self-pay | Admitting: Internal Medicine

## 2024-07-28 DIAGNOSIS — L299 Pruritus, unspecified: Secondary | ICD-10-CM

## 2024-07-29 DIAGNOSIS — M329 Systemic lupus erythematosus, unspecified: Secondary | ICD-10-CM | POA: Diagnosis not present

## 2024-07-29 NOTE — Telephone Encounter (Signed)
 Last Fill: 10/11/2023  Next Visit: 08/06/2024  Last Visit: 05/07/2024  Dx: Systemic lupus erythematosus, unspecified SLE type, unspecified organ involvement status   Current Dose per office note on 05/07/2024: not discussed  Okay to refill Hydroxyzine ?

## 2024-07-30 ENCOUNTER — Telehealth: Payer: Self-pay | Admitting: Pharmacist

## 2024-07-30 NOTE — Telephone Encounter (Signed)
 Received fax from [facility] regarding Saphnelo  IV (G9508) infusion that patient received on 07/29/2024. Dose: 300mg  Labs were not drawn.  Patient tolerated infusion without complications.  Changes/concerns since last visit: none  Next Saphnelo  infusion scheduled for 08/26/2024  Sherry Pennant, PharmD, MPH, BCPS, CPP Clinical Pharmacist (Rheumatology and Pulmonology)

## 2024-08-06 ENCOUNTER — Encounter: Payer: Self-pay | Admitting: Internal Medicine

## 2024-08-06 ENCOUNTER — Ambulatory Visit: Attending: Internal Medicine | Admitting: Internal Medicine

## 2024-08-06 VITALS — BP 129/77 | HR 89 | Temp 98.5°F | Resp 14 | Ht 67.0 in | Wt 171.6 lb

## 2024-08-06 DIAGNOSIS — M329 Systemic lupus erythematosus, unspecified: Secondary | ICD-10-CM | POA: Diagnosis not present

## 2024-08-06 DIAGNOSIS — I872 Venous insufficiency (chronic) (peripheral): Secondary | ICD-10-CM

## 2024-08-06 DIAGNOSIS — M359 Systemic involvement of connective tissue, unspecified: Secondary | ICD-10-CM | POA: Diagnosis not present

## 2024-08-06 DIAGNOSIS — Z79899 Other long term (current) drug therapy: Secondary | ICD-10-CM | POA: Diagnosis not present

## 2024-08-06 MED ORDER — HYDROXYCHLOROQUINE SULFATE 200 MG PO TABS: 400.0000 mg | ORAL_TABLET | Freq: Every day | ORAL | 0 refills | Status: DC

## 2024-08-06 MED ORDER — CYCLOBENZAPRINE HCL 10 MG PO TABS: 10.0000 mg | ORAL_TABLET | Freq: Two times a day (BID) | ORAL | 0 refills | Status: AC | PRN

## 2024-08-07 LAB — CBC WITH DIFFERENTIAL/PLATELET
Absolute Lymphocytes: 1795 {cells}/uL (ref 850–3900)
Absolute Monocytes: 495 {cells}/uL (ref 200–950)
Basophils Absolute: 41 {cells}/uL (ref 0–200)
Basophils Relative: 0.8 %
Eosinophils Absolute: 117 {cells}/uL (ref 15–500)
Eosinophils Relative: 2.3 %
HCT: 42.5 % (ref 35.0–45.0)
Hemoglobin: 14 g/dL (ref 11.7–15.5)
MCH: 30.2 pg (ref 27.0–33.0)
MCHC: 32.9 g/dL (ref 32.0–36.0)
MCV: 91.6 fL (ref 80.0–100.0)
MPV: 10.2 fL (ref 7.5–12.5)
Monocytes Relative: 9.7 %
Neutro Abs: 2652 {cells}/uL (ref 1500–7800)
Neutrophils Relative %: 52 %
Platelets: 246 Thousand/uL (ref 140–400)
RBC: 4.64 Million/uL (ref 3.80–5.10)
RDW: 12.6 % (ref 11.0–15.0)
Total Lymphocyte: 35.2 %
WBC: 5.1 Thousand/uL (ref 3.8–10.8)

## 2024-08-07 LAB — COMPREHENSIVE METABOLIC PANEL WITH GFR
AG Ratio: 1.7 (calc) (ref 1.0–2.5)
ALT: 16 U/L (ref 6–29)
AST: 22 U/L (ref 10–30)
Albumin: 4.7 g/dL (ref 3.6–5.1)
Alkaline phosphatase (APISO): 56 U/L (ref 31–125)
BUN/Creatinine Ratio: 15 (calc) (ref 6–22)
BUN: 15 mg/dL (ref 7–25)
CO2: 26 mmol/L (ref 20–32)
Calcium: 9.9 mg/dL (ref 8.6–10.2)
Chloride: 103 mmol/L (ref 98–110)
Creat: 1.01 mg/dL — ABNORMAL HIGH (ref 0.50–0.99)
Globulin: 2.7 g/dL (ref 1.9–3.7)
Glucose, Bld: 87 mg/dL (ref 65–99)
Potassium: 4.4 mmol/L (ref 3.5–5.3)
Sodium: 137 mmol/L (ref 135–146)
Total Bilirubin: 0.4 mg/dL (ref 0.2–1.2)
Total Protein: 7.4 g/dL (ref 6.1–8.1)
eGFR: 72 mL/min/1.73m2 (ref >=60)

## 2024-08-07 LAB — ANTI-DNA ANTIBODY, DOUBLE-STRANDED: ds DNA Ab: 13 [IU]/mL — ABNORMAL HIGH

## 2024-08-07 LAB — IGG, IGA, IGM
IgG (Immunoglobin G), Serum: 1192 mg/dL (ref 600–1640)
IgM, Serum: 179 mg/dL (ref 50–300)
Immunoglobulin A: 94 mg/dL (ref 47–310)

## 2024-08-15 DIAGNOSIS — R29818 Other symptoms and signs involving the nervous system: Secondary | ICD-10-CM | POA: Diagnosis not present

## 2024-08-15 DIAGNOSIS — L299 Pruritus, unspecified: Secondary | ICD-10-CM | POA: Diagnosis not present

## 2024-08-18 ENCOUNTER — Telehealth (HOSPITAL_BASED_OUTPATIENT_CLINIC_OR_DEPARTMENT_OTHER): Payer: Self-pay

## 2024-08-18 ENCOUNTER — Telehealth: Payer: Self-pay | Admitting: *Deleted

## 2024-08-18 ENCOUNTER — Telehealth (HOSPITAL_BASED_OUTPATIENT_CLINIC_OR_DEPARTMENT_OTHER): Admitting: Certified Nurse Midwife

## 2024-08-18 NOTE — Telephone Encounter (Signed)
 Call returned to patient. Patient reports large golf ball size lump in perineum deep in the tissue. Noticed 2 days ago. States she is on immunosuppressant, was advised by rheumatology to f/u with GYN.    Lump is uncomfortable. Denies redness, discharge or odor. No difficulty with voiding or bowel movements. Tried warm bath x4 on 08/19/24. Patient is requesting to schedule on Thursday or Friday this week.   Offered OV on 9/24, patient declined. OV scheduled on 9/25 with Dr. Nikki at 1030. Advised to continue warm soaks, contact office if any new symptoms develop or symptoms get worse. Advised I will provide update to Dr. Nikki and f/u with any additional recommendations. Patient agreeable.  Hx of hysterectomy.   Last OV 09/06/23 -JJ  Routing to Dr. Nikki for final review.   Encounter closed.

## 2024-08-18 NOTE — Telephone Encounter (Signed)
 RN called to patient to get more information about her needing to be seen. She reports that a couple of days ago she noticed a large mass located below her vagina, in the perineum area. She reports that it is located inside and is hard to see so she is unable to send a picture via mychart. She reports that feels very hard to the touch and is the size of a golf ball. Patient is currently in [place]  and will get back in to town Wednesday afternoon. Patient scheduled to see Arland Roller, NCM, on 08/21/2024 at 10:15 am. Patient agreeable with no further questions at this time.   Morna LOISE Quale, RN

## 2024-08-19 ENCOUNTER — Encounter: Payer: Self-pay | Admitting: Internal Medicine

## 2024-08-20 NOTE — Telephone Encounter (Signed)
 Contacted the patient's pharmacy and they states they are working on her hydroxychloroquine  now. Attempted to contact the patient and left a message that the pharmacy is filling her medication and to contact them. Advised if she has any other questions she can give us  a call.

## 2024-08-20 NOTE — Telephone Encounter (Signed)
 Attempted to contact patient to confirm how she is taking medication since a 30day supply was dispensed on 06/28/2024, [facility] for patient to call the office

## 2024-08-21 ENCOUNTER — Ambulatory Visit (HOSPITAL_BASED_OUTPATIENT_CLINIC_OR_DEPARTMENT_OTHER): Admitting: Certified Nurse Midwife

## 2024-08-21 ENCOUNTER — Encounter: Payer: Self-pay | Admitting: Obstetrics and Gynecology

## 2024-08-21 ENCOUNTER — Ambulatory Visit: Admitting: Obstetrics and Gynecology

## 2024-08-21 VITALS — BP 118/82 | HR 90

## 2024-08-21 DIAGNOSIS — N751 Abscess of Bartholin's gland: Secondary | ICD-10-CM

## 2024-08-21 MED ORDER — DOXYCYCLINE HYCLATE 100 MG PO CAPS: 100.0000 mg | ORAL_CAPSULE | Freq: Two times a day (BID) | ORAL | 0 refills | Status: AC

## 2024-08-21 NOTE — Progress Notes (Signed)
 GYNECOLOGY  VISIT   HPI: 43 y.o.   Single  Caucasian female   G0P0000 with Patient's last menstrual period was 10/10/2021.   here for: Lump in perineum. Causing her pain, can move it around, first noticed it about 2 months after her hysterectomy.  She took some expired Doxycycline  she had on hand for a few days.   Has pain.   No bleeding.   Using heat to treat the area.    No splinting to have bowel movements.  Had total laparoscopic hysterectomy with salpingectomy 10/31/21.   States she has healing issues post op.    Hx lupus.    Negative HSV testing of right labia majora ulceration 10/31/21.  GYNECOLOGIC HISTORY: Patient's last menstrual period was 10/10/2021. Contraception:  Vasectomy  Menopausal hormone therapy:  n/a Last 2 paps:  08/17/21 neg HR HPV neg, 08/26/19 neg HR HPV neg History of abnormal Pap or positive HPV:  no Mammogram:  05/16/22 Breast Density Cat C, BIRADS Cat 1 neg         OB History     Gravida  0   Para  0   Term  0   Preterm  0   AB  0   Living  0      SAB  0   IAB  0   Ectopic  0   Multiple  0   Live Births  0              Patient Active Problem List   Diagnosis Date Noted   Vitamin D  deficiency 11/07/2023   Localized swelling of right lower leg 11/01/2022   Chronic venous insufficiency of lower extremity 11/01/2022   Hyperglycemia 05/23/2022   Obesity without serious comorbidity 05/23/2022   Rash and other nonspecific skin eruption 04/06/2022   Leukocytosis 03/06/2022   Redness 11/14/2021   Blurry vision 11/07/2021   Hot flash not due to menopause 11/04/2021   S/P laparoscopic hysterectomy 10/31/2021   Urinary urgency 09/09/2021   Itching 09/09/2021   High risk medication use 07/25/2021   Finger joint swelling, left 04/01/2021   Systemic lupus erythematosus (HCC) 04/01/2021   Fibromyalgia 04/01/2021   Alopecia 11/17/2020   Other fatigue 10/12/2020   Migraine without aura    History of DVT (deep vein  thrombosis)    History of endometriosis    Right-sided muscle weakness 05/15/2019   Abnormal finding on MRI of brain 05/15/2019   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   Tremor 12/24/2017    Past Medical History:  Diagnosis Date   aub    Bruises easily 10/24/2021   Complication of anesthesia 10/15/2020   hypotension   Fibromyalgia    GERD (gastroesophageal reflux disease)    Heart murmur  PFO    off 81 mg aspirin since feb 2022 per pcp  no cardiologist   Hemangioma    History of DVT (deep vein thrombosis)    right le 7 yrs ago, right leg slight swollen   History of endometriosis    History of hiatal hernia    Hypotension    with 10-15-2020 endoscopy   Long-term corticosteroid use 02/20/2022   Corticosteroid   Migraine without aura    PFO (patent foramen ovale)    Systemic lupus erythematosus (HCC)    TIA    ages 39 and 28   Vascular insufficiency    Viral meningitis    age 22    Past Surgical History:  Procedure Laterality Date   ABDOMINAL  HYSTERECTOMY     ANTERIOR CRUCIATE LIGAMENT REPAIR Left 10/2018   CYSTOSCOPY  10/31/2021   Procedure: CYSTOSCOPY;  Surgeon: Jannis Kate Norris, MD;  Location: [facility];  Service: Gynecology;;   PELVIC LAPAROSCOPY     for endometriosis yrs ago per pt on 10-24-2021   right knee lateral release and meniscal repair  2011   TONSILLECTOMY     age 51, adenoids removed also   TOTAL LAPAROSCOPIC HYSTERECTOMY WITH SALPINGECTOMY Bilateral 10/31/2021   Procedure: TOTAL LAPAROSCOPIC HYSTERECTOMY WITH SALPINGECTOMY;  Surgeon: Jannis Kate Norris, MD;  Location: [facility];  Service: Gynecology;  Laterality: Bilateral;   UMBILICAL HERNIA REPAIR     yrs ago   UPPER GI ENDOSCOPY     with esophagus stretching 10-15-2020, 03-11-2020, 05-09-2019,  2013 with being polyp removed    Current Outpatient Medications  Medication Sig Dispense Refill   Anifrolumab -fnia 300 MG/2ML SOLN Inject 300 mg into the vein every 28  (twenty-eight) days.     cyclobenzaprine  (FLEXERIL ) 10 MG tablet Take 1 tablet (10 mg total) by mouth 2 (two) times daily as needed for muscle spasms. 180 tablet 0   gabapentin  (NEURONTIN ) 300 MG capsule Take by mouth.     hydroxychloroquine  (PLAQUENIL ) 200 MG tablet Take 2 tablets (400 mg total) by mouth daily. 180 tablet 0   hydrOXYzine  (ATARAX ) 25 MG tablet TAKE 1 TABLET AT NIGHT OR UP TO 2 TIMES DAILY AS NEEDED FOR ITCHING 180 tablet 0   moxifloxacin (VIGAMOX) 0.5 % ophthalmic solution INSTILL 1 DROP INTO AFFECTED EYE 3 TIMES A DAY X 7 DAYS     No current facility-administered medications for this visit.     ALLERGIES: Cephalexin, Penicillins, Sulfa antibiotics, and Lubiprostone  Family History  Problem Relation Age of Onset   Alcohol abuse Mother    Cancer Father        Lung and Colon   Heart attack Father    COPD Father    Diabetes Maternal Grandmother    Cervical cancer Maternal Grandmother    Fibromyalgia Maternal Grandmother    Breast cancer Maternal Grandmother        ? age of dx   Prostate cancer Maternal Grandfather    Heart attack Maternal Grandfather    ALS Paternal Grandmother    Prostate cancer Paternal Grandfather    Heart attack Paternal Grandfather    Esophageal cancer Paternal Grandfather    Breast cancer Paternal Aunt        58's   Ovarian cancer Paternal Aunt        72's   Breast cancer Other    Lung cancer Maternal Uncle    Colon cancer Neg Hx    Stomach cancer Neg Hx    Rectal cancer Neg Hx     Social History   Socioeconomic History   Marital status: Single    Spouse name: Not on file   Number of children: Not on file   Years of education: Not on file   Highest education level: Not on file  Occupational History   Not on file  Tobacco Use   Smoking status: Never    Passive exposure: Never   Smokeless tobacco: Never  Vaping Use   Vaping status: Never Used  Substance and Sexual Activity   Alcohol use: Yes    Alcohol/week: 2.0 standard  drinks of alcohol    Types: 2 Standard drinks or equivalent per week    Comment: occassionally   Drug use: Never  Sexual activity: Yes    Birth control/protection: Surgical    Comment: partner with vasectomy  Other Topics Concern   Not on file  Social History Narrative   Not on file   Social Drivers of Health   Financial Resource Strain: Not on file  Food Insecurity: Not on file  Transportation Needs: Not on file  Physical Activity: Not on file  Stress: Not on file  Social Connections: Not on file  Intimate Partner Violence: Not on file    Review of Systems  All other systems reviewed and are negative.   PHYSICAL EXAMINATION:   BP 118/82 (BP Location: Left Arm, Patient Position: Sitting)   Pulse 90   LMP 10/10/2021   SpO2 98%     General appearance: alert, cooperative and appears stated age  Pelvic: External genitalia:  left Bartholin's gland 3.5 cm and painful.   Erythema of skin overlying gland.               Urethra:  normal appearing urethra with no masses, tenderness or lesions         Left Bartholin gland abscess I and D Consent and time out done.  Sterile prep with Hibiclens.  Local 1% lidocaine , lot #[id number], lot Feb 2028. Incision made with scalpel.  Large amount of mucus and pus drained.  Wound culture obtained. No word catheter available for placement.  No complications.  Minimal EBL.   Chaperone was present for exam:  Kari HERO, CMA  ASSESSMENT:  Left Bartholin gland abscess.  On immunosuppression medication.   PLAN:  FU wound culture.  Doxycycline  100 mg po bid x 7 days.  Return to the office in 4 days for recheck.   She will communicate with her medical team prescribing her immunosuppressant medication as she may need to delay her next dosage.  20 min  total time was spent for this patient encounter, including preparation, face-to-face counseling with the patient, coordination of care, and documentation of the encounter in addition to doing  the I and D of her Bartholin gland abscess.  Prescription medication given.

## 2024-08-21 NOTE — Patient Instructions (Signed)
 Pocket of Fluid at the [facility] of the Labia (Bartholin's Cyst) Incision and Drainage: What to Expect  A Bartholin's cyst is a pocket of fluid that forms because of a blockage along the tube (duct) of the Bartholin's gland. Bartholin's glands are two pea-sized glands in the labia. The labia are folds of skin on the sides of the vaginal opening. These glands make fluid to moisten the outside of the vagina during sex. If the cyst gets infected with germs (bacteria), it's called a Bartholin's abscess. Incision and drainage is a surgery to open and drain a cyst or abscess. You may need this surgery if: You have a large cyst that interferes with sex. You have a painful cyst. You have a cyst that becomes infected and turns into an abscess. Tell your health care provider about: Any allergies you have. All medicines you take. These include vitamins, herbs, eye drops, and creams. Any problems you or family members have had with anesthesia. Any bleeding problems you have. Any surgeries you've had. Any medical conditions you have. Whether you're pregnant or may be pregnant. What are the risks? Your health care provider will talk with you about risks. These may include: The cyst coming back. Bleeding. Infection of a cyst. Infection spreading from an abscess. Poor wound healing. What happens before? Medicines Ask about changing or stopping: Any medicines you take. Any vitamins, herbs, or supplements you take. Do not take aspirin or ibuprofen  unless you're told to. General instructions Eat and drink as told. For your safety, you may: Need to wash your skin with a soap that kills germs. Get antibiotics. Have your procedure site marked. Have hair removed at the procedure site. If you'll be going home right after the procedure, plan to have a responsible adult: Drive you home from the hospital or clinic. You won't be allowed to drive. Stay with you for the time you're told. What happens during the  incision and drainage? An IV will be put into a vein in your hand or arm. You may be given: A sedative to help you relax. Anesthesia to keep you from feeling pain. A cut will be made over the cyst or abscess. The cyst or abscess will be drained and the space, called a cavity, will be flushed out. To help prevent the cyst from coming back, a soft tube called a catheter may be put into the cyst or abscess space. This catheter is left in place for about 4 weeks. If you don't have a catheter put in, the edges of the cut will be stitched with absorbable stitches to create a small opening for drainage. These steps may vary. Ask what you can expect. What happens after? You'll be watched closely until you leave. This includes checking your pain level, blood pressure, heart rate, and breathing rate. If you were given a sedative, do not drive or use machines until you're told it's safe. A sedative can make you sleepy. After your procedure, it's common to have mild pain and light discharge from your vagina. This information is not intended to replace advice given to you by your health care provider. Make sure you discuss any questions you have with your health care provider. Document Revised: 08/27/2023 Document Reviewed: 08/27/2023 Elsevier Patient Education  2025 [employer].

## 2024-08-22 ENCOUNTER — Telehealth: Payer: Self-pay | Admitting: *Deleted

## 2024-08-22 ENCOUNTER — Other Ambulatory Visit: Payer: Self-pay | Admitting: Medical Genetics

## 2024-08-22 NOTE — Telephone Encounter (Signed)
 Patient contacted the office and left message stating she placed on antibiotics for 5 days. Patient states she has an infusion scheduled for Tuesday. Patient would like to know is she can keep her appointment or if she needs to postpone. Please advise.

## 2024-08-24 ENCOUNTER — Ambulatory Visit: Payer: Self-pay | Admitting: Obstetrics and Gynecology

## 2024-08-24 LAB — WOUND CULTURE
MICRO NUMBER:: 17016832
SPECIMEN QUALITY:: ADEQUATE

## 2024-08-25 ENCOUNTER — Ambulatory Visit: Payer: Self-pay | Admitting: Internal Medicine

## 2024-08-25 ENCOUNTER — Ambulatory Visit: Admitting: Obstetrics and Gynecology

## 2024-08-25 ENCOUNTER — Encounter: Payer: Self-pay | Admitting: Obstetrics and Gynecology

## 2024-08-25 VITALS — BP 118/82 | HR 94

## 2024-08-25 DIAGNOSIS — N751 Abscess of Bartholin's gland: Secondary | ICD-10-CM

## 2024-08-25 NOTE — Telephone Encounter (Signed)
 Thanks, I agree with the recommendation.

## 2024-08-25 NOTE — Telephone Encounter (Signed)
 Patient reached back out again - transferred to me.  I have advised her to hold Saphnelo  until she has completed entire antibiotic course and URTI has resolved. She is concerned about her lupus flaring. I advised that Saphnelo  has been reported to contribute to respiratory infections therefore it is best practice to reschedule infusion. She completed abx course on Wednesday. She will plan to reschedule infusion to this upcoming Friday.  Sherry Pennant, PharmD, MPH, BCPS, CPP Clinical Pharmacist [facility])

## 2024-08-25 NOTE — Progress Notes (Signed)
 GYNECOLOGY  VISIT   HPI: 43 y.o.   Single  Caucasian female   G0P0000 with Patient's last menstrual period was 10/10/2021.   here for: Follow up - Bartholin's Gland Abscess.  Patient had incision and drainage of left Bartholin gland abscess on 08/21/24.  She was prescribed Doxycycline .   Not much drainage over the weekend.    E Coli noted in her wound cx.   Doxycycline  was not tested on sensitivities.  She has allergies to sulfa, Keflex, and PCN.  GYNECOLOGIC HISTORY: Patient's last menstrual period was 10/10/2021. Contraception:  Vasectomy  Menopausal hormone therapy:  n/a Last 2 paps:  08/17/21 neg HR HPV neg, 08/26/19 neg HR HPV neg History of abnormal Pap or positive HPV:  no Mammogram:  05/16/22 Breast Density Cat C, BIRADS Cat 1 neg        OB History     Gravida  0   Para  0   Term  0   Preterm  0   AB  0   Living  0      SAB  0   IAB  0   Ectopic  0   Multiple  0   Live Births  0              Patient Active Problem List   Diagnosis Date Noted   Vitamin D  deficiency 11/07/2023   Localized swelling of right lower leg 11/01/2022   Chronic venous insufficiency of lower extremity 11/01/2022   Hyperglycemia 05/23/2022   Obesity without serious comorbidity 05/23/2022   Rash and other nonspecific skin eruption 04/06/2022   Leukocytosis 03/06/2022   Redness 11/14/2021   Blurry vision 11/07/2021   Hot flash not due to menopause 11/04/2021   S/P laparoscopic hysterectomy 10/31/2021   Urinary urgency 09/09/2021   Itching 09/09/2021   High risk medication use 07/25/2021   Finger joint swelling, left 04/01/2021   Systemic lupus erythematosus (HCC) 04/01/2021   Fibromyalgia 04/01/2021   Alopecia 11/17/2020   Other fatigue 10/12/2020   Migraine without aura    History of DVT (deep vein thrombosis)    History of endometriosis    Right-sided muscle weakness 05/15/2019   Abnormal finding on MRI of brain 05/15/2019   High risk HPV infection  01/22/2018   Tremor 12/24/2017    Past Medical History:  Diagnosis Date   aub    Bruises easily 10/24/2021   Complication of anesthesia 10/15/2020   hypotension   Fibromyalgia    GERD (gastroesophageal reflux disease)    Heart murmur  PFO    off 81 mg aspirin since feb 2022 per pcp  no cardiologist   Hemangioma    History of DVT (deep vein thrombosis)    right le 7 yrs ago, right leg slight swollen   History of endometriosis    History of hiatal hernia    Hypotension    with 10-15-2020 endoscopy   Long-term corticosteroid use 02/20/2022   Corticosteroid   Migraine without aura    PFO (patent foramen ovale)    Systemic lupus erythematosus (HCC)    TIA    ages 66 and 37   Vascular insufficiency    Viral meningitis    age 89    Past Surgical History:  Procedure Laterality Date   ABDOMINAL HYSTERECTOMY     ANTERIOR CRUCIATE LIGAMENT REPAIR Left 10/2018   CYSTOSCOPY  10/31/2021   Procedure: CYSTOSCOPY;  Surgeon: Jannis Kate Norris, MD;  Location: [facility];  Service:  Gynecology;;   PELVIC LAPAROSCOPY     for endometriosis yrs ago per pt on 10-24-2021   right knee lateral release and meniscal repair  2011   TONSILLECTOMY     age 41, adenoids removed also   TOTAL LAPAROSCOPIC HYSTERECTOMY WITH SALPINGECTOMY Bilateral 10/31/2021   Procedure: TOTAL LAPAROSCOPIC HYSTERECTOMY WITH SALPINGECTOMY;  Surgeon: Jannis Kate Norris, MD;  Location: [facility];  Service: Gynecology;  Laterality: Bilateral;   UMBILICAL HERNIA REPAIR     yrs ago   UPPER GI ENDOSCOPY     with esophagus stretching 10-15-2020, 03-11-2020, 05-09-2019,  2013 with being polyp removed    Current Outpatient Medications  Medication Sig Dispense Refill   Anifrolumab -fnia 300 MG/2ML SOLN Inject 300 mg into the vein every 28 (twenty-eight) days.     cyclobenzaprine  (FLEXERIL ) 10 MG tablet Take 1 tablet (10 mg total) by mouth 2 (two) times daily as needed for muscle spasms. 180  tablet 0   doxycycline  (VIBRAMYCIN ) 100 MG capsule Take 1 capsule (100 mg total) by mouth 2 (two) times daily. Take BID for 7 days.  Take with food as can cause GI distress. 14 capsule 0   gabapentin  (NEURONTIN ) 300 MG capsule Take by mouth.     hydroxychloroquine  (PLAQUENIL ) 200 MG tablet Take 2 tablets (400 mg total) by mouth daily. 180 tablet 0   hydrOXYzine  (ATARAX ) 25 MG tablet TAKE 1 TABLET AT NIGHT OR UP TO 2 TIMES DAILY AS NEEDED FOR ITCHING 180 tablet 0   moxifloxacin (VIGAMOX) 0.5 % ophthalmic solution INSTILL 1 DROP INTO AFFECTED EYE 3 TIMES A DAY X 7 DAYS     No current facility-administered medications for this visit.     ALLERGIES: Cephalexin, Penicillins, Sulfa antibiotics, and Lubiprostone  Family History  Problem Relation Age of Onset   Alcohol abuse Mother    Cancer Father        Lung and Colon   Heart attack Father    COPD Father    Diabetes Maternal Grandmother    Cervical cancer Maternal Grandmother    Fibromyalgia Maternal Grandmother    Breast cancer Maternal Grandmother        ? age of dx   Prostate cancer Maternal Grandfather    Heart attack Maternal Grandfather    ALS Paternal Grandmother    Prostate cancer Paternal Grandfather    Heart attack Paternal Grandfather    Esophageal cancer Paternal Grandfather    Breast cancer Paternal Aunt        45's   Ovarian cancer Paternal Aunt        58's   Breast cancer Other    Lung cancer Maternal Uncle    Colon cancer Neg Hx    Stomach cancer Neg Hx    Rectal cancer Neg Hx     Social History   Socioeconomic History   Marital status: Single    Spouse name: Not on file   Number of children: Not on file   Years of education: Not on file   Highest education level: Not on file  Occupational History   Not on file  Tobacco Use   Smoking status: Never    Passive exposure: Never   Smokeless tobacco: Never  Vaping Use   Vaping status: Never Used  Substance and Sexual Activity   Alcohol use: Yes     Alcohol/week: 2.0 standard drinks of alcohol    Types: 2 Standard drinks or equivalent per week    Comment: occassionally   Drug use:  Never   Sexual activity: Yes    Birth control/protection: Surgical    Comment: partner with vasectomy  Other Topics Concern   Not on file  Social History Narrative   Not on file   Social Drivers of Health   Financial Resource Strain: Not on file  Food Insecurity: Not on file  Transportation Needs: Not on file  Physical Activity: Not on file  Stress: Not on file  Social Connections: Not on file  Intimate Partner Violence: Not on file    Review of Systems  All other systems reviewed and are negative.   PHYSICAL EXAMINATION:   BP 118/82 (BP Location: Left Arm, Patient Position: Sitting)   Pulse 94   LMP 10/10/2021   SpO2 96%     General appearance: alert, cooperative and appears stated age Left Bartholin gland 2.5 cm.  Opening in the skin closed.   Procedure:  Incision and Drainage of left Bartholin's Gland Abscess Consent and time out done.  Sterile prep with Hibiclens.  Local 1% lidocaine , lot #[id number], exp Feb 2028. Incision made with scalpel.  Large amount of bloody pus drained.  Loculations broken up with hemostat.  Word catheter placed (lot [id number], exp 01/24/29) and filled with 2 cc normal saline.   No complications.  Tolerated well.  Minimal EBL.   Chaperone was present for exam:  Kari HERO, CMA  ASSESSMENT:  Left Bartholin gland abscess.   PLAN:  Continue course of Doxycycline .  FU in 3 days for reassessment of Word catheter and reduced volume of fluid or removal.

## 2024-08-26 ENCOUNTER — Telehealth: Payer: Self-pay | Admitting: *Deleted

## 2024-08-26 NOTE — Telephone Encounter (Signed)
 [facility]. [place] to [facility] Clinical (supporting Bobie FORBES Cathlyn JAYSON Nikki, MD) (Selected Message)     08/25/24  9:52 PM The catheter seems to really burn, as well as a constant feeling of have to go to the bathroom. All normal? WOUND CULTURE Cathlyn JAYSON Nikki Bobie FORBES, MD to [place] [facility]     08/24/24  8:54 PM Hi Aniesa,    Your wound culture showed E Coli bacterial infection, which is common in this region of the body.   I hope you are feeling better, and I look forward to seeing you for your recheck appointment.    Bobie Nikki, MD    Last read by Eleanor MOTE Cerezo at 9:52PM on 08/25/2024. WOUND CULTURE

## 2024-08-26 NOTE — Telephone Encounter (Signed)
 Message left to return call to Triage Nurse at [phone], option #4.

## 2024-08-28 ENCOUNTER — Ambulatory Visit: Admitting: Obstetrics and Gynecology

## 2024-08-28 ENCOUNTER — Encounter: Payer: Self-pay | Admitting: Obstetrics and Gynecology

## 2024-08-28 VITALS — BP 116/82 | HR 78

## 2024-08-28 DIAGNOSIS — N751 Abscess of Bartholin's gland: Secondary | ICD-10-CM

## 2024-08-28 NOTE — Progress Notes (Signed)
 GYNECOLOGY  VISIT   HPI: 43 y.o.   Single  Caucasian female   G0P0000 with Patient's last menstrual period was 10/10/2021.   here for: Follow up - Bartholin's Gland Abscess & word catheter       Very little drainage.  Feels vaginal irritation following her immunosuppression medication.   GYNECOLOGIC HISTORY: Patient's last menstrual period was 10/10/2021. Contraception:  Vasectomy  Menopausal hormone therapy:  n/a Last 2 paps:  08/17/21 neg HR HPV neg, 08/26/19 neg HR HPV neg  History of abnormal Pap or positive HPV:  no Mammogram:  05/16/22 Breast Density Cat C, BIRADS Cat 1 neg        OB History     Gravida  0   Para  0   Term  0   Preterm  0   AB  0   Living  0      SAB  0   IAB  0   Ectopic  0   Multiple  0   Live Births  0              Patient Active Problem List   Diagnosis Date Noted   Vitamin D  deficiency 11/07/2023   Localized swelling of right lower leg 11/01/2022   Chronic venous insufficiency of lower extremity 11/01/2022   Hyperglycemia 05/23/2022   Obesity without serious comorbidity 05/23/2022   Rash and other nonspecific skin eruption 04/06/2022   Leukocytosis 03/06/2022   Redness 11/14/2021   Blurry vision 11/07/2021   Hot flash not due to menopause 11/04/2021   S/P laparoscopic hysterectomy 10/31/2021   Urinary urgency 09/09/2021   Itching 09/09/2021   High risk medication use 07/25/2021   Finger joint swelling, left 04/01/2021   Systemic lupus erythematosus (HCC) 04/01/2021   Fibromyalgia 04/01/2021   Alopecia 11/17/2020   Other fatigue 10/12/2020   Migraine without aura    History of DVT (deep vein thrombosis)    History of endometriosis    Right-sided muscle weakness 05/15/2019   Abnormal finding on MRI of brain 05/15/2019   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   Tremor 12/24/2017    Past Medical History:  Diagnosis Date   aub    Bruises easily 10/24/2021   Complication of anesthesia 10/15/2020   hypotension    Fibromyalgia    GERD (gastroesophageal reflux disease)    Heart murmur  PFO    off 81 mg aspirin since feb 2022 per pcp  no cardiologist   Hemangioma    History of DVT (deep vein thrombosis)    right le 7 yrs ago, right leg slight swollen   History of endometriosis    History of hiatal hernia    Hypotension    with 10-15-2020 endoscopy   Long-term corticosteroid use 02/20/2022   Corticosteroid   Migraine without aura    PFO (patent foramen ovale)    Systemic lupus erythematosus (HCC)    TIA    ages 63 and 49   Vascular insufficiency    Viral meningitis    age 17    Past Surgical History:  Procedure Laterality Date   ABDOMINAL HYSTERECTOMY     ANTERIOR CRUCIATE LIGAMENT REPAIR Left 10/2018   CYSTOSCOPY  10/31/2021   Procedure: CYSTOSCOPY;  Surgeon: Jannis Kate Norris, MD;  Location: [facility];  Service: Gynecology;;   PELVIC LAPAROSCOPY     for endometriosis yrs ago per pt on 10-24-2021   right knee lateral release and meniscal repair  2011   TONSILLECTOMY  age 9, adenoids removed also   TOTAL LAPAROSCOPIC HYSTERECTOMY WITH SALPINGECTOMY Bilateral 10/31/2021   Procedure: TOTAL LAPAROSCOPIC HYSTERECTOMY WITH SALPINGECTOMY;  Surgeon: Jannis Kate Norris, MD;  Location: [facility];  Service: Gynecology;  Laterality: Bilateral;   UMBILICAL HERNIA REPAIR     yrs ago   UPPER GI ENDOSCOPY     with esophagus stretching 10-15-2020, 03-11-2020, 05-09-2019,  2013 with being polyp removed    Current Outpatient Medications  Medication Sig Dispense Refill   Anifrolumab -fnia 300 MG/2ML SOLN Inject 300 mg into the vein every 28 (twenty-eight) days.     cyclobenzaprine  (FLEXERIL ) 10 MG tablet Take 1 tablet (10 mg total) by mouth 2 (two) times daily as needed for muscle spasms. 180 tablet 0   doxycycline  (VIBRAMYCIN ) 100 MG capsule Take 1 capsule (100 mg total) by mouth 2 (two) times daily. Take BID for 7 days.  Take with food as can cause GI distress.  14 capsule 0   gabapentin  (NEURONTIN ) 300 MG capsule Take by mouth.     hydroxychloroquine  (PLAQUENIL ) 200 MG tablet Take 2 tablets (400 mg total) by mouth daily. 180 tablet 0   hydrOXYzine  (ATARAX ) 25 MG tablet TAKE 1 TABLET AT NIGHT OR UP TO 2 TIMES DAILY AS NEEDED FOR ITCHING 180 tablet 0   moxifloxacin (VIGAMOX) 0.5 % ophthalmic solution INSTILL 1 DROP INTO AFFECTED EYE 3 TIMES A DAY X 7 DAYS     No current facility-administered medications for this visit.     ALLERGIES: Cephalexin, Penicillins, Sulfa antibiotics, and Lubiprostone  Family History  Problem Relation Age of Onset   Alcohol abuse Mother    Cancer Father        Lung and Colon   Heart attack Father    COPD Father    Diabetes Maternal Grandmother    Cervical cancer Maternal Grandmother    Fibromyalgia Maternal Grandmother    Breast cancer Maternal Grandmother        ? age of dx   Prostate cancer Maternal Grandfather    Heart attack Maternal Grandfather    ALS Paternal Grandmother    Prostate cancer Paternal Grandfather    Heart attack Paternal Grandfather    Esophageal cancer Paternal Grandfather    Breast cancer Paternal Aunt        67's   Ovarian cancer Paternal Aunt        7's   Breast cancer Other    Lung cancer Maternal Uncle    Colon cancer Neg Hx    Stomach cancer Neg Hx    Rectal cancer Neg Hx     Social History   Socioeconomic History   Marital status: Single    Spouse name: Not on file   Number of children: Not on file   Years of education: Not on file   Highest education level: Not on file  Occupational History   Not on file  Tobacco Use   Smoking status: Never    Passive exposure: Never   Smokeless tobacco: Never  Vaping Use   Vaping status: Never Used  Substance and Sexual Activity   Alcohol use: Yes    Alcohol/week: 2.0 standard drinks of alcohol    Types: 2 Standard drinks or equivalent per week    Comment: occassionally   Drug use: Never   Sexual activity: Yes    Birth  control/protection: Surgical    Comment: partner with vasectomy  Other Topics Concern   Not on file  Social History Narrative   Not  on file   Social Drivers of Health   Financial Resource Strain: Not on file  Food Insecurity: Not on file  Transportation Needs: Not on file  Physical Activity: Not on file  Stress: Not on file  Social Connections: Not on file  Intimate Partner Violence: Not on file    Review of Systems  All other systems reviewed and are negative.   PHYSICAL EXAMINATION:   LMP 10/10/2021     General appearance: alert, cooperative and appears stated age   Pelvic: External genitalia:  Word catheter in left Bartholin gland, removed.  Very little mucus noted.    Chaperone was present for exam:  Kari HERO, CMA  ASSESSMENT:  Left Bartholin gland abscess, treated.  Status post I and D x 2 with placement of Word catheter with second I and D.  Status post course of Doxycycline .   PLAN:  Call for any recurrence of the abscess.  Use soap and water to cleanse the area.  Consider wearing loose clothing.  Consider OTC monistat, but not currently due to the Bartholin gland opening.  Fu for annual exam and prn.   14 min  total time was spent for this patient encounter, including preparation, face-to-face counseling with the patient, coordination of care, and documentation of the encounter.

## 2024-08-28 NOTE — Progress Notes (Signed)
 GYNECOLOGY  VISIT   HPI: 43 y.o.   Single  Caucasian female   G0P0000 with Patient's last menstrual period was 10/10/2021.   here for: Follow up - Bartholin's Gland Abscess & word catheter   Gland is still draining.  Some stinging of the area.   Finishing Doxycycline  tomorrow.       GYNECOLOGIC HISTORY: Patient's last menstrual period was 10/10/2021. Contraception:  Vasectomy Menopausal hormone therapy:  n/a Last 2 paps:  08/17/21 neg HR HPV neg, 08/26/19 neg HR HPV neg   History of abnormal Pap or positive HPV:  no Mammogram:  05/16/22 Breast Density Cat C, BIRADS Cat 1 neg          OB History     Gravida  0   Para  0   Term  0   Preterm  0   AB  0   Living  0      SAB  0   IAB  0   Ectopic  0   Multiple  0   Live Births  0              Patient Active Problem List   Diagnosis Date Noted   Vitamin D  deficiency 11/07/2023   Localized swelling of right lower leg 11/01/2022   Chronic venous insufficiency of lower extremity 11/01/2022   Hyperglycemia 05/23/2022   Obesity without serious comorbidity 05/23/2022   Rash and other nonspecific skin eruption 04/06/2022   Leukocytosis 03/06/2022   Redness 11/14/2021   Blurry vision 11/07/2021   Hot flash not due to menopause 11/04/2021   S/P laparoscopic hysterectomy 10/31/2021   Urinary urgency 09/09/2021   Itching 09/09/2021   High risk medication use 07/25/2021   Finger joint swelling, left 04/01/2021   Systemic lupus erythematosus (HCC) 04/01/2021   Fibromyalgia 04/01/2021   Alopecia 11/17/2020   Other fatigue 10/12/2020   Migraine without aura    History of DVT (deep vein thrombosis)    History of endometriosis    Right-sided muscle weakness 05/15/2019   Abnormal finding on MRI of brain 05/15/2019   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   Tremor 12/24/2017    Past Medical History:  Diagnosis Date   aub    Bruises easily 10/24/2021   Complication of anesthesia 10/15/2020   hypotension    Fibromyalgia    GERD (gastroesophageal reflux disease)    Heart murmur  PFO    off 81 mg aspirin since feb 2022 per pcp  no cardiologist   Hemangioma    History of DVT (deep vein thrombosis)    right le 7 yrs ago, right leg slight swollen   History of endometriosis    History of hiatal hernia    Hypotension    with 10-15-2020 endoscopy   Long-term corticosteroid use 02/20/2022   Corticosteroid   Migraine without aura    PFO (patent foramen ovale)    Systemic lupus erythematosus (HCC)    TIA    ages 35 and 51   Vascular insufficiency    Viral meningitis    age 44    Past Surgical History:  Procedure Laterality Date   ABDOMINAL HYSTERECTOMY     ANTERIOR CRUCIATE LIGAMENT REPAIR Left 10/2018   CYSTOSCOPY  10/31/2021   Procedure: CYSTOSCOPY;  Surgeon: Jannis Kate Norris, MD;  Location: [facility];  Service: Gynecology;;   PELVIC LAPAROSCOPY     for endometriosis yrs ago per pt on 10-24-2021   right knee lateral release and meniscal  repair  2011   TONSILLECTOMY     age 39, adenoids removed also   TOTAL LAPAROSCOPIC HYSTERECTOMY WITH SALPINGECTOMY Bilateral 10/31/2021   Procedure: TOTAL LAPAROSCOPIC HYSTERECTOMY WITH SALPINGECTOMY;  Surgeon: Jannis Kate Norris, MD;  Location: [facility];  Service: Gynecology;  Laterality: Bilateral;   UMBILICAL HERNIA REPAIR     yrs ago   UPPER GI ENDOSCOPY     with esophagus stretching 10-15-2020, 03-11-2020, 05-09-2019,  2013 with being polyp removed    Current Outpatient Medications  Medication Sig Dispense Refill   Anifrolumab -fnia 300 MG/2ML SOLN Inject 300 mg into the vein every 28 (twenty-eight) days.     cyclobenzaprine  (FLEXERIL ) 10 MG tablet Take 1 tablet (10 mg total) by mouth 2 (two) times daily as needed for muscle spasms. 180 tablet 0   doxycycline  (VIBRAMYCIN ) 100 MG capsule Take 1 capsule (100 mg total) by mouth 2 (two) times daily. Take BID for 7 days.  Take with food as can cause GI distress.  14 capsule 0   gabapentin  (NEURONTIN ) 300 MG capsule Take by mouth.     hydroxychloroquine  (PLAQUENIL ) 200 MG tablet Take 2 tablets (400 mg total) by mouth daily. 180 tablet 0   hydrOXYzine  (ATARAX ) 25 MG tablet TAKE 1 TABLET AT NIGHT OR UP TO 2 TIMES DAILY AS NEEDED FOR ITCHING 180 tablet 0   moxifloxacin (VIGAMOX) 0.5 % ophthalmic solution INSTILL 1 DROP INTO AFFECTED EYE 3 TIMES A DAY X 7 DAYS     No current facility-administered medications for this visit.     ALLERGIES: Cephalexin, Penicillins, Sulfa antibiotics, and Lubiprostone  Family History  Problem Relation Age of Onset   Alcohol abuse Mother    Cancer Father        Lung and Colon   Heart attack Father    COPD Father    Diabetes Maternal Grandmother    Cervical cancer Maternal Grandmother    Fibromyalgia Maternal Grandmother    Breast cancer Maternal Grandmother        ? age of dx   Prostate cancer Maternal Grandfather    Heart attack Maternal Grandfather    ALS Paternal Grandmother    Prostate cancer Paternal Grandfather    Heart attack Paternal Grandfather    Esophageal cancer Paternal Grandfather    Breast cancer Paternal Aunt        48's   Ovarian cancer Paternal Aunt        39's   Breast cancer Other    Lung cancer Maternal Uncle    Colon cancer Neg Hx    Stomach cancer Neg Hx    Rectal cancer Neg Hx     Social History   Socioeconomic History   Marital status: Single    Spouse name: Not on file   Number of children: Not on file   Years of education: Not on file   Highest education level: Not on file  Occupational History   Not on file  Tobacco Use   Smoking status: Never    Passive exposure: Never   Smokeless tobacco: Never  Vaping Use   Vaping status: Never Used  Substance and Sexual Activity   Alcohol use: Yes    Alcohol/week: 2.0 standard drinks of alcohol    Types: 2 Standard drinks or equivalent per week    Comment: occassionally   Drug use: Never   Sexual activity: Yes    Birth  control/protection: Surgical    Comment: partner with vasectomy  Other Topics Concern  Not on file  Social History Narrative   Not on file   Social Drivers of Health   Financial Resource Strain: Not on file  Food Insecurity: Not on file  Transportation Needs: Not on file  Physical Activity: Not on file  Stress: Not on file  Social Connections: Not on file  Intimate Partner Violence: Not on file    Review of Systems  All other systems reviewed and are negative.   PHYSICAL EXAMINATION:   BP 116/82 (BP Location: Left Arm, Patient Position: Sitting)   Pulse 78   LMP 10/10/2021   SpO2 97%     General appearance: alert, cooperative and appears stated age  Pelvic: External genitalia:  Word catheter present in the left Bartholin gland.  Catheter drained and 1 cc sterile normal saline placed.     Chaperone was present for exam:  Kari HERO, CMA  ASSESSMENT:  Left Bartholin gland abscess.  Word catheter present.    PLAN:  Finish Doxycycline . Continue Word catheter.  Follow up in 4 days for catheter removal.  Return also for annual exam.   19 min  total time was spent for this patient encounter, including preparation, face-to-face counseling with the patient, coordination of care, and documentation of the encounter.

## 2024-09-01 ENCOUNTER — Ambulatory Visit: Admitting: Obstetrics and Gynecology

## 2024-09-01 ENCOUNTER — Encounter: Payer: Self-pay | Admitting: Obstetrics and Gynecology

## 2024-09-01 VITALS — BP 116/74 | HR 88

## 2024-09-01 DIAGNOSIS — N751 Abscess of Bartholin's gland: Secondary | ICD-10-CM

## 2024-09-01 NOTE — Patient Instructions (Signed)
 Pocket of Fluid at the Base of the Labia (Bartholin's Cyst): What to Know  A Bartholin's cyst is a pocket of fluid that forms on a Bartholin's gland. Bartholin's glands are small glands in the labia. The labia are folds of skin on the sides of the vaginal opening. This type of cyst causes a bulge or lump near the opening of the vagina. If you have a cyst that's small and not infected, you may be able to take care of it at home. If your cyst gets infected, it may cause pain. Your doctor may need to drain it. If you're older than 41 years old, you should talk with your doctor about whether some of the cyst or area around it should be taken out and checked for problems. What are the causes? The cyst may be caused by a blocked Bartholin's gland. Germs inside of the cyst can cause an infection. In many cases, the cause isn't known. What are the signs or symptoms? Symptoms may include: A bulge or lump near the opening of the vagina. Discomfort or pain that gets worse when you: Have sex. Walk. Sit. Redness, swelling, or fluid draining from the area. These are signs of an abscess. Other worse symptoms may develop based on: The size of your cyst. Whether you have an abscess from infection. How is this treated? You may not need treatment if your cyst isn't causing symptoms. The cyst can go away on its own with hot baths or warm packs. Large cysts or cysts that are infected may be treated with: Antibiotics. A procedure to drain the fluid. Cysts that keep coming back will need to be drained many times. Your doctor may talk with you about surgery to take out the cyst. Follow these instructions at home: Medicines Take your medicines only as told. If you were given antibiotics, take them as told. Do not stop taking them even if you start to feel better. Managing pain and swelling Use heat as told. Use the heat source that your doctor recommends, such as a moist heat pack or a heating pad. Do this as  often as told. Place a towel between your skin and the heat source. Leave the heat on for 20-30 minutes. If your skin turns red, take off the heat right away to prevent burns. The risk of burns is higher if you can't feel pain, heat, or cold. General instructions Try sitz baths to help with pain and swelling. A sitz bath is a warm water bath. The water should only come up to your hips and should cover your butt. You may take sitz baths several times a day. Do not push on or squeeze your cyst. Do not have sex until the cyst has gone away or your wound from cyst drainage has healed. Take these steps to help prevent a cyst from coming back and to prevent other cysts from forming: Take a bath or shower once a day. Clean your vaginal area with mild soap and water. Practice safe sex to prevent STIs. Talk to your doctor about how to protect yourself from STIs. Ask your doctor which forms of birth control are best for you. Contact a doctor if: You have a fever. You have a cyst that gets larger or a cyst that comes back. You have new symptoms. Your symptoms get worse. This information is not intended to replace advice given to you by your health care provider. Make sure you discuss any questions you have with your health care provider.  Document Revised: 08/27/2023 Document Reviewed: 08/27/2023 Elsevier Patient Education  2025 [employer].

## 2024-09-02 ENCOUNTER — Telehealth: Payer: Self-pay | Admitting: Pharmacist

## 2024-09-02 DIAGNOSIS — M329 Systemic lupus erythematosus, unspecified: Secondary | ICD-10-CM | POA: Diagnosis not present

## 2024-09-02 NOTE — Telephone Encounter (Signed)
 Received fax from [facility] regarding Saphnelo  IV [phone]) infusion that patient received on 09/02/24. Dose: 300mg  Labs were drawn.  Patient tolerated infusion without complications.  Changes/concerns since last visit:   Next Saphnelo  infusion scheduled for 09/30/24

## 2024-09-17 ENCOUNTER — Telehealth: Payer: Self-pay

## 2024-09-17 NOTE — Telephone Encounter (Signed)
 Received a fax from [facility] with labs for the patient. Patient had Complements C3 and C4, CMP, Sed Rate, CBC, and dsDNA antibody drawn. Labs have been placed on Dr. Joyce desk for review.

## 2024-09-30 DIAGNOSIS — M329 Systemic lupus erythematosus, unspecified: Secondary | ICD-10-CM | POA: Diagnosis not present

## 2024-10-02 ENCOUNTER — Telehealth: Payer: Self-pay | Admitting: Pharmacist

## 2024-10-02 NOTE — Telephone Encounter (Signed)
 Received fax from [facility] regarding Saphnelo  IV [phone]) infusion that patient received on 09/30/2024. Dose: 300mg  Labs were not drawn.  Patient tolerated infusion without complications.  Changes/concerns since last visit: none  Next Saphnelo  infusion scheduled for 10/28/2024  Sherry Pennant, PharmD, MPH, BCPS, CPP Clinical Pharmacist [facility])

## 2024-10-09 ENCOUNTER — Other Ambulatory Visit: Payer: Self-pay | Admitting: Internal Medicine

## 2024-10-09 DIAGNOSIS — M329 Systemic lupus erythematosus, unspecified: Secondary | ICD-10-CM

## 2024-10-16 ENCOUNTER — Other Ambulatory Visit: Payer: Self-pay | Admitting: Obstetrics and Gynecology

## 2024-10-16 NOTE — Telephone Encounter (Signed)
 Med refill request:   doxycycline  (VIBRAMYCIN ) 100 MG capsule  Start:  08/21/24 Disp:   14 capsules Refills:  0  Last AEX:  08/17/21 Next AEX:  03/23/2025 Last MMG (if hormonal med):  N/A Refill authorized? Please Advise.

## 2024-10-28 ENCOUNTER — Telehealth: Payer: Self-pay | Admitting: Pharmacist

## 2024-10-28 DIAGNOSIS — M329 Systemic lupus erythematosus, unspecified: Secondary | ICD-10-CM | POA: Diagnosis not present

## 2024-10-28 NOTE — Telephone Encounter (Signed)
 Received fax from [facility] [phone]) [facility] regarding Saphnelo  (anifrolumab -fnia) IV (J0491) infusion that patient received on 10/28/24. Provider: Dr. Lonni Ester Diagnosis: systemic lupus erythematosus (SLE) Dose: 300mg  Labs drawn: No;   Patient tolerated infusion without complications.  Changes/concerns since last visit: No  Next Saphnelo  (anifrolumab -fnia) IV (G9508) infusion scheduled for 11/25/2024  Ellee Wawrzyniak, PharmD, MPH, BCPS, CPP Clinical Pharmacist [facility])

## 2024-10-29 DIAGNOSIS — M9901 Segmental and somatic dysfunction of cervical region: Secondary | ICD-10-CM | POA: Diagnosis not present

## 2024-10-29 DIAGNOSIS — M9903 Segmental and somatic dysfunction of lumbar region: Secondary | ICD-10-CM | POA: Diagnosis not present

## 2024-10-29 DIAGNOSIS — M62830 Muscle spasm of back: Secondary | ICD-10-CM | POA: Diagnosis not present

## 2024-10-29 DIAGNOSIS — M9902 Segmental and somatic dysfunction of thoracic region: Secondary | ICD-10-CM | POA: Diagnosis not present

## 2024-11-03 NOTE — Progress Notes (Signed)
 "  Office Visit Note  Patient: Christy Lowe             Date of Birth: 07-08-1981           MRN: [id number]             PCP: Purcell Emil Schanz, MD Referring: Purcell Emil Schanz, MD Visit Date: 11/06/2024   Subjective:  Medication Management (Still having some skin rashes still an the infusions not working as long. Patient also has a bruise/discoloration that's been there for 6months and still having heel pain.)   Discussed the use of AI scribe software for clinical note transcription with the patient, who gave verbal consent to proceed.  History of Present Illness   Christy Lowe is a 43 y.o. female here for follow up for SLE on saphnelo  300 mg IV monthly and HCQ 400 mg daily.     She has experienced an increase in skin rashes, described as raised, itchy bumps that progress to blisters, primarily on her face. She uses hydroxyzine  to manage the itching, which is exacerbated by heat and drying her hair. Relief is found by applying body oil and taking hydroxyzine .  She mentions a persistent bruise-like mark present for six months, which is painful when pressed but does not change in appearance. She is unsure of its cause and notes it does not seem to be a typical bruise.  She has been receiving Saphnelo  infusions for her lupus and reports that it does not seem to be working as well as it did previously. She notes that the infusion process has become quicker over time.  She experiences swelling, particularly in her right leg. She has a history of a vein being compressed on the left side, but the swelling occurs on the right. She also reports her toes turning purple and feeling tingly.  She has been on hydroxychloroquine  and previously tried minocycline , which caused skin discoloration. She is cautious about medications that might cause hair loss, as she experienced significant hair loss with methotrexate in the past.  No new allergies. Itching primarily on her face and  extremities, particularly after showering or when hot. No new pain or itching associated with the persistent bruise-like mark.       Previous HPI 08/06/2024 Christy Lowe is a 44 y.o. female here for follow up for SLE on saphnelo  300 mg IV monthly and HCQ 400 mg daily.     She has ongoing skin issues, including a rash on her hip, leg, and arm, described as blistery but not itchy. Urgent care determined it was not shingles and recommended a mixture of calamine lotion and bacitracin, which helped dry up the rash. She also has persistent skin issues on her fingers, which she picks at, leading to burning sensations.   She experiences recurrent eye infections characterized by bright red eyes and significant discharge. Moxifloxacin drops clear up the symptoms within a day. The infections often occur while traveling, and she suspects an allergic reaction to redness relief drops may have contributed. She keeps moxifloxacin drops readily available to manage these episodes.   She is currently on minocycline  100 mg once daily, which was initially effective but its current efficacy is uncertain. She also receives Saphnelo  infusions, which she feels may not be as effective if administered too quickly. She has noticed skin darkening, which she attributes to sun exposure and possibly medication side effects.   She reports swelling in her leg and pain in her finger joints, particularly  the right hand's second and fourth MCP joints. Persistent swelling in one leg has been evaluated by a vascular doctor without a clear diagnosis. She experiences pain in her finger joints and persistent leg swelling.   She uses hydroxyzine  to manage itching, which has been effective. She has a persistent bruise under her arm that has been present for three months and is painful when pressure is applied. No sinus congestion, cold, or flu-like symptoms. No significant swelling of lymph nodes.         Previous  HPI 05/07/2024 Christy Lowe is a 43 y.o. female here for follow up for SLE on saphnelo  300 mg IV monthly and HCQ 400 mg daily.  She presents with concerns about medication side effects and persistent symptoms.   She has a persistent rash on her face and neck, attributed to sun exposure, causing small blisters. She has been taking minocycline , initially prescribed at one pill twice a day, but due to queasiness, she is only taking one pill at night. Despite the reduced dosage, there is improvement in the rash especially around the edges of the face and sides of neck.   Her current lupus medication, Saphnelo , is not working as effectively as before. She experienced better results when the infusion was administered more slowly during a previous session. Recently, she has been experiencing pain earlier in the month than usual, which typically occurs closer to her infusion date.  Some may be seeing a very good response between 2 to 3 weeks.   She describes a new symptom of her left eyelid getting 'stuck' intermittently without associated pain or irritation. This occurs randomly and is not related to the time of day or fatigue.   She experiences right leg swelling, particularly in the morning, which worsens throughout the day. She recently underwent a CT scan to check for any blockages, as previous ultrasounds were inconclusive.   She experiences pain in her hand, particularly when gripping, and notes a grinding sensation.   Previous HPI 02/05/2024 Christy Lowe is a 44 y.o. female here for follow up for SLE on saphnelo  300 mg IV monthly and HCQ 400 mg daily.   She experiences joint swelling and pain, particularly in the morning, which improves after receiving her regular medication, Saphnelo . However, the relief from this medication seems to be diminishing over time. She also takes hydroxychloroquine , cyclobenzaprine , and gabapentin .   She has ongoing skin issues characterized by blister-like  lesions that resemble 'poison ivy blisters' or 'water blisters'. These lesions are not itchy but burn and take weeks to heal. They are located on her back, chest, and face, particularly around the hairline and neck. She has tried various topical treatments without improvement. She was previously misdiagnosed with MS and later prescribed Accutane, which her insurance denied. She has not been able to start this treatment and has not heard back from the prescribing physician's office.   She has swelling in her right leg, which is larger than the left by about two and a half inches by the end of the day. She has tried compression socks and Lasix  without significant improvement. The swelling is persistent and does not respond well to these treatments.   She has a history of a hysterectomy and reports that some of her symptoms have improved since the surgery. She has not been sick recently and has not taken antibiotics.        Previous HPI 11/07/23 Frieda Arnall Mesquita is a 43 y.o. female here for follow  up for systemic lupus on hydroxychloroquine  400 mg daily, Saphnelo  300 mg IV monthly.  She presents with persistent itching and the development of water-filled blisters on the face, neck, and chest. The itching is described as severe, with the patient noting that it feels like they want to scratch their skin off. The itching and blistering are temporarily relieved following their regular infusions, but symptoms return in the week leading up to the next scheduled infusion.   The patient also reports cold, tingling sensations in their toes, and severe heel pain when in contact with the bed. They note that these symptoms also improve following their infusions but return as the effects of the infusion wear off.   In addition to these symptoms, the patient has been experiencing joint stiffness, which also worsens in the week leading up to their infusion. They have a history of transient ischemic attacks (TIAs),  but have not experienced one recently.   The patient has been on a regimen of gabapentin  and hydroxyzine  to manage their symptoms. They report that the gabapentin  has been helpful in managing the itching, and they continue to take hydroxyzine  at night to prevent severe itching episodes.   The patient has a history of high D-dimer levels and has been advised to take a daily aspirin. They also have hemangiomas throughout their body on imaging. Despite these findings, extensive testing has not revealed any blood clots.   The patient's symptoms and their trajectory have led to multiple diagnoses, including MS and lupus. However, the patient expresses frustration with the lack of a definitive diagnosis and treatment plan. They are seeking a solution to alleviate their persistent itching and blistering, as well as their other symptoms. She was recommended to have screening for antiphospholipid syndrome per neurologist, who otherwise found age advanced degenerative change on MRI but no inflammatory lesions.     Previous HPI 08/08/2023 Christy MOTE Stanek is a 43 y.o. female here for follow up for systemic lupus on hydroxychloroquine  400 mg daily, Saphnelo  300 mg IV monthly.  Last treatment infusion was on August 28 still noticing a improvement immediately afterwards but with some decline currently has active symptoms again only 2 weeks later.  She did have a recent work trip to [place].  Experienced some increased leg swelling after plane flight despite use of compressive sock and taking Lasix .  Still has the active rash with itching and bumps that appear fluid-filled and subsequently unroofed by scratching along the jawline and sides of the neck.  Has experienced a few more painful ulcers most often on the roof of the mouth.   She saw Dr. Jorizzo for evaluation August 20. He did not see evidence consistent for active cutaneous lupus.  Recommended neurology consultation for suspected MS. previously saw local  neurology with negative antibody testing and normal nerve conduction study had not recommended any continued treatment there was treated for several years previously.  Current appointment for this now scheduled in January.   Previous HPI 05/15/2023 Christy MOTE Phimmasone is a 43 y.o. female here for follow up for systemic lupus on hydroxychloroquine  400 mg daily, Saphnelo  300 mg IV monthly.  No new severe disease flareup.  She is seeing definite benefit with her infusions but notices some waning efficacy especially in the last week between dosing this was noticeable she thinks the last 4 treatments.  Also having issue with persistent skin problems legs are very itchy without visible rashes.  She is trying multiple medications for this including Allegra and the  hydroxyzine  with neurologist partially helpful.  Had a recent sinus infection but with prolonged course had x-ray concerning for left upper lung infiltrate so was treated with a Z-Pak.  No other infections or antibiotics since her last visit.   Previous HPI 02/19/23 Christy MOTE Beamer is a 43 y.o. female here for follow up for SLE on hydroxychloroquine  400 mg daily and Saphnelo  300 mg IV monthly.  Symptoms with arthritis, rashes, mucosal ulcers, pedal edema.  Since her last visit is recently started to do a lot worse with biggest complaint of increased swelling.  She has been noticing discoloration and puffiness especially in the fingers on both hands.  Also somewhat more leg swelling.  She has tried taking the Lasix  and not seeing a large increase in your output nor any improvement in the leg swelling.  She has been having what feels like UTIs about monthly.  Predominantly urinary urgency and frequency no other associated symptoms.  Skin rashes have been ongoing around the neck and jaw area about the same as before.  Does not see discrete joint swelling so much as the overall puffiness but does have increased pain and stiffness in both hands. Sometimes going  numb and particularly has trouble gripping tightly cannot seem to exert a normal amount of force.    Previous HPI 11/07/22 Christy MOTE Webb is a 43 y.o. female here for follow up for SLE on HCQ 400 mg daily and saphnelo  300 mg IV monthly. Symptoms including arthritis, rashes, mucosal ulcers and pedal edema.  She is noticing a good benefit with the Saphnelo  infusions but describes pain in her joints and generalized sensitivity gets worse again by at least 1 week before each next treatment is due.  Noticing a few new areas of swelling of her hands and skin discoloration on her hands the rashes around her head and neck are about the same.  She had recent evaluation at [facility] with no superficial veins amenable to procedure intervention identified.  The swelling is becoming faster to develop and somewhat more extensive she notices mild discoloration around the lower shin and ankle described a bluish hue when very swollen.  She quit her job due to these problems knowing that the stress of working continuous 12-hour shifts was worsening symptoms and having to spend some many months in [place]  or travel throughout the country was very problematic for maintaining medication access.   Previous HPI 08/09/22 Christy MOTE Preziosi is a 43 y.o. female here for follow up for lupus on HCQ 400 mg daily and saphnelo  300 mg IV monthly.  Her joint symptoms have remained pretty well controlled after completely tapering off the prednisone .  She is noticing a difference in symptoms after the Saphnelo  infusion versus 4 weeks later proceeding her next dose.  Continues to have very little appetite but not seeing any weight reduction.  Skin itching especially on her upper legs is a persistent issue she is using topical emollients with partial benefit.  More recently she is also experiencing an increase in urinary frequency both daytime and increased nocturia.  No visible blood in urine or dysuria.  She is currently anticipating an upcoming  plastic surgery needing adjustments for perioperative drug management.   Previous HPI 05/01/2022 Christy MOTE Talaga is a 43 y.o. female here for follow up for lupus on HCQ 400 mg daily started saphnelo  infusion and prednisone  tapering down to 2.5 mg daily now. So far she is noticing some improvement with overall severity of skin redness swelling and  itching but definitely not resolved.  Face and chest continue having the most erythema.  She has noticed some bruising without any specific cause.  She is noticing some swelling or otherwise thickening in the joints of her right hand and around the right wrist.   Previous HPI 04/06/2022 Christy MOTE Dutch is a 43 y.o. female here for follow up for lupus on HCQ 400 mg daily started saphnelo  infusion last month and prednisone  tapering down to 2.5 mg daily now. She felt a large improvement after her first treatment with decreased joint pains and decreased abdominal pain. She is tapering off the prednisone  without severe problem. She has noticed oral ulcers on the top of her mouth for several days and is having a rash with small papules on both sides of her neck and lower face. Appetite is decreased but not losing any weight. She feels a lot of stress after dealing with work incident.   Previous HPI 03/06/2022 Christy MOTE Winegarden is a 43 y.o. female here for follow up for lupus on HCQ 400 mg daily and prednisone  tapering down to 10 mg daily after increase to 30 last month from flare up with mouth ulcers and diffuse rashes. She had several complications with high dose prednisone  increased weight, mood irritability, and wound dehiscence af hysterectomy cuff site. We discussed plan to start saphenlo infusion as a replacement for benlysta  treatment which did not improve symptoms or lab abnormalities much after about 6 months treatment.  She has some improvement in the mouth and lip ulcers she is not sure whether starting the valacyclovir  about 4 days ago has had significant  effect.  She completed the course of oral antibiotics abdominal pain is partially better. She is scheduled for first saphnelo  infusion Thursday and has OBGYN followup later this week.     Previous HPI 12/13/21 Christy MOTE Delima is a 43 y.o. female here for follow up for systemic lupus with ongoing joint pains and skin rash on HCQ 400 mg daily, Benlysta  200 mg [place] weekly, and prednisone  10 mg daily. Since our last visit she went for hysterectomy this was uneventful and has recovered well. She was off the benlysta  for about 1 month in total around the surgery. She has not felt much improvement so far still having a lot of fatigue, skin rash and flushing, joint pain in several areas with left wrist swelling. Headaches are about the same as before but now having recurrent episodes of vertigo. She is now taking hydroxyzine  twice on some days due to persistent itching, and feels excessively hot most of the time. She Is back to taking 10 mg of prednisone  when trying to drop this to 5 mg felt extreme fatigue and back pain that felt like bruising and stiffness all over her body.   Previous HPI 11/17/20 Christy MOTE Gougeon is a 43 y.o. female here for evaluation of positive ANA. She is noticing multiple symptoms including generalized alopecia, oral dryness, left sided neck swelling, joint pain and stiffness, position tremor, and episodic skin rashes. She has had multiple ongoing problems since many years ago. She was hospitalized for viral meningitis at age 25 and feels she has been susceptible to infections ever since this time or before, with numerous cases of bronchitis and has had some lung nodules on CT imaging during these repeat episodes. About 7 years ago she suffered from a RLE DVT provoked after right knee arthroscopy and developed right sided face and arm numbness. Workup indicated she had a TIA also  noted to have PFO. She takes ASA since that time and has taken anticoagulation in perioperative  periods. Workup with MRI and LP studies were apparently also indicative for MS. She was treated with multiple rounds of steroids for months at a time and felt a lot of mood disturbance from these. She was never started on other immunomodulatory or immunosuppressive treatments.   Review of Systems  Constitutional:  Positive for fatigue.  HENT:  Positive for mouth sores. Negative for mouth dryness.   Eyes:  Negative for dryness.  Respiratory:  Negative for shortness of breath.   Cardiovascular:  Negative for chest pain and palpitations.  Gastrointestinal:  Negative for blood in stool, constipation and diarrhea.  Endocrine: Negative for increased urination.  Genitourinary:  Negative for involuntary urination.  Musculoskeletal:  Positive for joint pain, joint pain, joint swelling, myalgias, morning stiffness and myalgias. Negative for gait problem, muscle weakness and muscle tenderness.  Skin:  Positive for rash. Negative for color change, hair loss and sensitivity to sunlight.  Allergic/Immunologic: Positive for susceptible to infections.  Neurological:  Negative for dizziness and headaches.  Hematological:  Negative for swollen glands.  Psychiatric/Behavioral:  Negative for depressed mood and sleep disturbance. The patient is not nervous/anxious.     PMFS History:  Patient Active Problem List   Diagnosis Date Noted   Vitamin D  deficiency 11/07/2023   Localized swelling of right lower leg 11/01/2022   Chronic venous insufficiency of lower extremity 11/01/2022   Hyperglycemia 05/23/2022   Obesity without serious comorbidity 05/23/2022   Rash and other nonspecific skin eruption 04/06/2022   Leukocytosis 03/06/2022   Redness 11/14/2021   Blurry vision 11/07/2021   Hot flash not due to menopause 11/04/2021   S/P laparoscopic hysterectomy 10/31/2021   Urinary urgency 09/09/2021   Itching 09/09/2021   High risk medication use 07/25/2021   Finger joint swelling, left 04/01/2021    Systemic lupus erythematosus (HCC) 04/01/2021   Fibromyalgia 04/01/2021   Alopecia 11/17/2020   Other fatigue 10/12/2020   Migraine without aura    History of DVT (deep vein thrombosis)    History of endometriosis    Right-sided muscle weakness 05/15/2019   Abnormal finding on MRI of brain 05/15/2019   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   Tremor 12/24/2017    Past Medical History:  Diagnosis Date   aub    Bruises easily 10/24/2021   Complication of anesthesia 10/15/2020   hypotension   Fibromyalgia    GERD (gastroesophageal reflux disease)    Heart murmur  PFO    off 81 mg aspirin since feb 2022 per pcp  no cardiologist   Hemangioma    History of DVT (deep vein thrombosis)    right le 7 yrs ago, right leg slight swollen   History of endometriosis    History of hiatal hernia    Hypotension    with 10-15-2020 endoscopy   Long-term corticosteroid use 02/20/2022   Corticosteroid   Migraine without aura    PFO (patent foramen ovale)    Systemic lupus erythematosus (HCC)    TIA    ages 58 and 24   Vascular insufficiency    Viral meningitis    age 4    Family History  Problem Relation Age of Onset   Alcohol abuse Mother    Cancer Father        Lung and Colon   Heart attack Father    COPD Father    Diabetes Maternal Grandmother    Cervical cancer  Maternal Grandmother    Fibromyalgia Maternal Grandmother    Breast cancer Maternal Grandmother        ? age of dx   Prostate cancer Maternal Grandfather    Heart attack Maternal Grandfather    ALS Paternal Grandmother    Prostate cancer Paternal Grandfather    Heart attack Paternal Grandfather    Esophageal cancer Paternal Grandfather    Breast cancer Paternal Aunt        3's   Ovarian cancer Paternal Aunt        22's   Breast cancer Other    Lung cancer Maternal Uncle    Colon cancer Neg Hx    Stomach cancer Neg Hx    Rectal cancer Neg Hx    Past Surgical History:  Procedure Laterality Date   ABDOMINAL  HYSTERECTOMY     ANTERIOR CRUCIATE LIGAMENT REPAIR Left 10/2018   CYSTOSCOPY  10/31/2021   Procedure: CYSTOSCOPY;  Surgeon: Jannis Kate Norris, MD;  Location: [facility];  Service: Gynecology;;   PELVIC LAPAROSCOPY     for endometriosis yrs ago per pt on 10-24-2021   right knee lateral release and meniscal repair  2011   TONSILLECTOMY     age 65, adenoids removed also   TOTAL LAPAROSCOPIC HYSTERECTOMY WITH SALPINGECTOMY Bilateral 10/31/2021   Procedure: TOTAL LAPAROSCOPIC HYSTERECTOMY WITH SALPINGECTOMY;  Surgeon: Jannis Kate Norris, MD;  Location: [facility];  Service: Gynecology;  Laterality: Bilateral;   UMBILICAL HERNIA REPAIR     yrs ago   UPPER GI ENDOSCOPY     with esophagus stretching 10-15-2020, 03-11-2020, 05-09-2019,  2013 with being polyp removed   Social History   Social History Narrative   Not on file   Immunization History  Administered Date(s) Administered   PFIZER(Purple Top)SARS-COV-2 Vaccination 06/27/2020, 07/28/2020   PPD Test 01/23/2019     Objective: Vital Signs: BP 111/72   Pulse 84   Temp 98.1 F (36.7 C)   Resp 16   Ht 5' 7 (1.702 m)   Wt 172 lb 9.6 oz (78.3 kg)   LMP 09/23/2021   BMI 27.03 kg/m    Physical Exam Eyes:     Conjunctiva/sclera: Conjunctivae normal.  Cardiovascular:     Rate and Rhythm: Normal rate and regular rhythm.  Pulmonary:     Effort: Pulmonary effort is normal.     Breath sounds: Normal breath sounds.  Lymphadenopathy:     Cervical: No cervical adenopathy.  Skin:    General: Skin is warm and dry.     Findings: Rash present.     Comments: Facial rash worse around periphery and on neck with erythematous papules and some unroofed with excoriations No pitting edema or overlying skin rash No digital pitting    Neurological:     Mental Status: She is alert.  Psychiatric:        Mood and Affect: Mood normal.      Musculoskeletal Exam:  Shoulders full ROM no tenderness or  swelling Elbows full ROM no tenderness or swelling Wrists full ROM no tenderness or swelling Fingers full ROM no tenderness or swelling Knees full ROM tenderness to pressure without palpable effusion Ankles full ROM no tenderness or swelling   Investigation: No additional findings.  Imaging: No results found.  Recent Labs: Lab Results  Component Value Date   WBC 5.1 08/06/2024   HGB 14.0 08/06/2024   PLT 246 08/06/2024   NA 137 08/06/2024   K 4.4 08/06/2024   CL  103 08/06/2024   CO2 26 08/06/2024   GLUCOSE 87 08/06/2024   BUN 15 08/06/2024   CREATININE 1.01 (H) 08/06/2024   BILITOT 0.4 08/06/2024   ALKPHOS 47 10/26/2021   AST 22 08/06/2024   ALT 16 08/06/2024   PROT 7.4 08/06/2024   ALBUMIN 4.9 10/26/2021   CALCIUM 9.9 08/06/2024   GFRAA >60 03/10/2020   QFTBGOLDPLUS NEGATIVE 05/07/2024    Speciality Comments: [facility] 01/10/2024 WNL Follow-up: 12 months Appt 12/01/2023 per patient  Patient requests 90 day prescriptions  Procedures:  No procedures performed Allergies: Cephalexin, Penicillins, Sulfa antibiotics, and Lubiprostone   Assessment / Plan:     Visit Diagnoses: Systemic lupus erythematosus, unspecified SLE type, unspecified organ involvement status (HCC) - Plan: dapsone  25 MG tablet 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 Current Saphnelo  treatment may be losing efficacy. Differential includes neurodermatitis or small fiber neuropathy. Dapsone  considered for skin lupus activity, safe with hydroxychloroquine . - Continue HCQ 400 mg daily - Initiated dapsone  at 25 then 50 mg for skin lupus activity. - Continue hydroxyzine  PRN for pruritus. - flexeril  10 mg BID PRN - Monitor blood counts periodically due to potential bone marrow suppression with dapsone . - Communicate with infusion center to adjust Saphnelo  infusion rate and assess for efficacy changes if this is really having an impact.  High risk medication  use - hydroxychloroquine  400 mg daily, Chronic venous insufficiency of lower extremity Chronic venous insufficiency with persistent right leg swelling. No arterial involvement, reducing risk of severe complications.        Orders: No orders of the defined types were placed in this encounter.  Meds ordered this encounter  Medications   dapsone  25 MG tablet    Sig: Take 1 tablet (25 mg total) by mouth daily for 30 days, THEN 2 tablets (50 mg total) daily.    Dispense:  60 tablet    Refill:  1     Follow-Up Instructions: Return in about 2 months (around 01/07/2025) for SLE on HCQ/SAPH/DAP start f/u 2mos.   Lonni LELON Ester, MD  Note - This record has been created using [employer].  Chart creation errors have been sought, but may not always  have been located. Such creation errors do not reflect on  the standard of medical care. "

## 2024-11-05 DIAGNOSIS — M9902 Segmental and somatic dysfunction of thoracic region: Secondary | ICD-10-CM | POA: Diagnosis not present

## 2024-11-05 DIAGNOSIS — M9903 Segmental and somatic dysfunction of lumbar region: Secondary | ICD-10-CM | POA: Diagnosis not present

## 2024-11-05 DIAGNOSIS — M9901 Segmental and somatic dysfunction of cervical region: Secondary | ICD-10-CM | POA: Diagnosis not present

## 2024-11-05 DIAGNOSIS — M62830 Muscle spasm of back: Secondary | ICD-10-CM | POA: Diagnosis not present

## 2024-11-06 ENCOUNTER — Ambulatory Visit: Attending: Internal Medicine | Admitting: Internal Medicine

## 2024-11-06 ENCOUNTER — Encounter: Payer: Self-pay | Admitting: Internal Medicine

## 2024-11-06 VITALS — BP 111/72 | HR 84 | Temp 98.1°F | Resp 16 | Ht 67.0 in | Wt 172.6 lb

## 2024-11-06 DIAGNOSIS — Z79899 Other long term (current) drug therapy: Secondary | ICD-10-CM

## 2024-11-06 DIAGNOSIS — I872 Venous insufficiency (chronic) (peripheral): Secondary | ICD-10-CM

## 2024-11-06 DIAGNOSIS — M329 Systemic lupus erythematosus, unspecified: Secondary | ICD-10-CM

## 2024-11-06 MED ORDER — DAPSONE 25 MG PO TABS: ORAL_TABLET | ORAL | 1 refills | Status: AC

## 2024-11-07 ENCOUNTER — Other Ambulatory Visit: Payer: Self-pay | Admitting: Internal Medicine

## 2024-11-07 DIAGNOSIS — L299 Pruritus, unspecified: Secondary | ICD-10-CM

## 2024-11-07 DIAGNOSIS — M329 Systemic lupus erythematosus, unspecified: Secondary | ICD-10-CM

## 2024-11-07 NOTE — Telephone Encounter (Signed)
 Last Fill: 08/06/2024 (PLQ) 07/29/2024 (hydroxyzine )  Eye exam: 01/10/2024 WNL   Labs: 08/06/2024 dsDNA remains mildly positive at 13 no change from before. Immunoglobulin levels were normal so immune function should be normal. Blood counts normal. Serum creatinine was slightly increased at 1.01 this is barely abnormal and could be explained by slight dehydration. No specific medication changes needed.   Next Visit: 01/15/2025  Last Visit: 11/06/2024  IK:Dbduzfpr lupus erythematosus, unspecified SLE type, unspecified organ involvement status   Current Dose per office note 11/06/2024: hydroxychloroquine  400 mg daily, hydroxyzine  dose not mentioned  Okay to refill Plaquenil  and hydroxyzine ?

## 2024-11-11 DIAGNOSIS — M9902 Segmental and somatic dysfunction of thoracic region: Secondary | ICD-10-CM | POA: Diagnosis not present

## 2024-11-11 DIAGNOSIS — M9903 Segmental and somatic dysfunction of lumbar region: Secondary | ICD-10-CM | POA: Diagnosis not present

## 2024-11-11 DIAGNOSIS — M62830 Muscle spasm of back: Secondary | ICD-10-CM | POA: Diagnosis not present

## 2024-11-11 DIAGNOSIS — M9901 Segmental and somatic dysfunction of cervical region: Secondary | ICD-10-CM | POA: Diagnosis not present

## 2024-11-18 DIAGNOSIS — M62830 Muscle spasm of back: Secondary | ICD-10-CM | POA: Diagnosis not present

## 2024-11-18 DIAGNOSIS — M9901 Segmental and somatic dysfunction of cervical region: Secondary | ICD-10-CM | POA: Diagnosis not present

## 2024-11-18 DIAGNOSIS — M9903 Segmental and somatic dysfunction of lumbar region: Secondary | ICD-10-CM | POA: Diagnosis not present

## 2024-11-18 DIAGNOSIS — M9902 Segmental and somatic dysfunction of thoracic region: Secondary | ICD-10-CM | POA: Diagnosis not present

## 2024-11-19 ENCOUNTER — Encounter: Payer: Self-pay | Admitting: Internal Medicine

## 2024-11-28 ENCOUNTER — Other Ambulatory Visit: Payer: Self-pay | Admitting: Internal Medicine

## 2024-11-28 DIAGNOSIS — M329 Systemic lupus erythematosus, unspecified: Secondary | ICD-10-CM

## 2024-12-03 ENCOUNTER — Telehealth: Payer: Self-pay | Admitting: Pharmacist

## 2024-12-03 NOTE — Telephone Encounter (Addendum)
 Patient states that when she first received at [facility] her infusion was one hour but now is 30 minutes. She would like all labs removed from infusions as she is seeing Dr. Jeannetta every 3 months  [facility] however unable to reach. Left detailed VM  If they return call, please detail the following 1) Change infusion rate of Saphnelo  to be 1 hour of administration 2) If they cannot see that we drew labs at our office, please remove labs. We do not want labs drawn if they are already updated. Patient is aware that it is our office protocol to have labs on infusion orders and infsuion center should not draw labs if we have updated them at office  Sherry Pennant, PharmD, MPH, BCPS, CPP Clinical Pharmacist   ----- Message from Lonni Jeannetta, MD sent at 12/01/2024  9:56 AM EST ----- Ms. Lonon asks if infusion orders can be adjusted to slower saphnelo  infusion to what sounds like the initial dosing was longer than the subsequent ones. I think this is probably not related to anything but could we try checking on that? Thanks.

## 2024-12-09 ENCOUNTER — Other Ambulatory Visit: Payer: Self-pay | Admitting: Medical Genetics

## 2024-12-09 DIAGNOSIS — Z006 Encounter for examination for normal comparison and control in clinical research program: Secondary | ICD-10-CM

## 2024-12-09 NOTE — Telephone Encounter (Signed)
 [facility] again - unable to reach. Left VM requesting return call to confirm receipt and change in Saphnelo  infusion  Rayen Palen, PharmD, MPH, BCPS, CPP Clinical Pharmacist

## 2024-12-10 NOTE — Telephone Encounter (Signed)
 Form faxed to [facility]  Sherry Pennant, PharmD, MPH, BCPS, CPP Clinical Pharmacist

## 2024-12-10 NOTE — Telephone Encounter (Signed)
 Received letter from [facility]. Saphnelo  is approved from 12/01/2024 to 12/01/2025 for [facility].  Reference # [id number]  Forwarded fax to [facility].  Sherry Pennant, PharmD, MPH, BCPS, CPP Clinical Pharmacist

## 2024-12-10 NOTE — Telephone Encounter (Addendum)
 Received a voicemail from Star at [facility], she stated that they would need a new order and in the special instructions put infuse over 1 hour and it can be faxed to [phone]

## 2024-12-10 NOTE — Telephone Encounter (Signed)
 Printed form for Saphnelo  with updated infusion time and labs

## 2024-12-11 ENCOUNTER — Other Ambulatory Visit: Payer: Self-pay | Admitting: Internal Medicine

## 2024-12-11 DIAGNOSIS — L299 Pruritus, unspecified: Secondary | ICD-10-CM

## 2024-12-11 NOTE — Telephone Encounter (Signed)
 Last Fill: 11/07/2024  Next Visit: 01/05/2025  Last Visit: 11/06/2024  Dx: Systemic lupus erythematosus, unspecified SLE type, unspecified organ involvement status   Current Dose per office note on 11/06/2024: Dose not discussed.  Okay to refill Hydroxyzine ?

## 2024-12-25 ENCOUNTER — Telehealth: Payer: Self-pay | Admitting: Pharmacist

## 2024-12-25 NOTE — Telephone Encounter (Signed)
 Received fax from [facility] regarding Saphnelo  (anifrolumab -fnia) IV (J0491) infusion that patient received on 12/25/2024. Provider: Dr. Lonni RIce Diagnosis: systemic lupus erythematosus (SLE) Dose: 300mg  Labs drawn: No;   Patient tolerated infusion without complications.  Changes/concerns since last visit: No  Next Saphnelo  (anifrolumab -fnia) IV (J0491) infusion  will be scheduled in [place]  Layni Kreamer, PharmD, MPH, BCPS, CPP Clinical Pharmacist

## 2025-01-01 NOTE — Assessment & Plan Note (Signed)
 SABRA

## 2025-01-01 NOTE — Progress Notes (Unsigned)
 "  Office Visit Note  Patient: Christy Lowe             Date of Birth: [birth year]           MRN: [id number]             PCP: Purcell Emil Schanz, MD Referring: Purcell Emil Schanz, MD Visit Date: 01/15/2025   Subjective:  No chief complaint on file.   History of Present Illness: Christy Lowe is a 44 y.o. female here for follow up  for SLE on saphnelo  300 mg IV monthly and HCQ 400 mg daily.      Previous HPI 11/06/2024 Christy Lowe is a 44 y.o. female here for follow up for SLE on saphnelo  300 mg IV monthly and HCQ 400 mg daily.      She has experienced an increase in skin rashes, described as raised, itchy bumps that progress to blisters, primarily on her face. She uses hydroxyzine  to manage the itching, which is exacerbated by heat and drying her hair. Relief is found by applying body oil and taking hydroxyzine .   She mentions a persistent bruise-like mark present for six months, which is painful when pressed but does not change in appearance. She is unsure of its cause and notes it does not seem to be a typical bruise.   She has been receiving Saphnelo  infusions for her lupus and reports that it does not seem to be working as well as it did previously. She notes that the infusion process has become quicker over time.   She experiences swelling, particularly in her right leg. She has a history of a vein being compressed on the left side, but the swelling occurs on the right. She also reports her toes turning purple and feeling tingly.   She has been on hydroxychloroquine  and previously tried minocycline , which caused skin discoloration. She is cautious about medications that might cause hair loss, as she experienced significant hair loss with methotrexate in the past.   No new allergies. Itching primarily on her face and extremities, particularly after showering or when hot. No new pain or itching associated with the persistent bruise-like mark.         Previous  HPI 08/06/2024 Christy Lowe is a 44 y.o. female here for follow up for SLE on saphnelo  300 mg IV monthly and HCQ 400 mg daily.     She has ongoing skin issues, including a rash on her hip, leg, and arm, described as blistery but not itchy. Urgent care determined it was not shingles and recommended a mixture of calamine lotion and bacitracin, which helped dry up the rash. She also has persistent skin issues on her fingers, which she picks at, leading to burning sensations.   She experiences recurrent eye infections characterized by bright red eyes and significant discharge. Moxifloxacin drops clear up the symptoms within a day. The infections often occur while traveling, and she suspects an allergic reaction to redness relief drops may have contributed. She keeps moxifloxacin drops readily available to manage these episodes.   She is currently on minocycline  100 mg once daily, which was initially effective but its current efficacy is uncertain. She also receives Saphnelo  infusions, which she feels may not be as effective if administered too quickly. She has noticed skin darkening, which she attributes to sun exposure and possibly medication side effects.   She reports swelling in her leg and pain in her finger joints, particularly the right hand's second and fourth  MCP joints. Persistent swelling in one leg has been evaluated by a vascular doctor without a clear diagnosis. She experiences pain in her finger joints and persistent leg swelling.   She uses hydroxyzine  to manage itching, which has been effective. She has a persistent bruise under her arm that has been present for three months and is painful when pressure is applied. No sinus congestion, cold, or flu-like symptoms. No significant swelling of lymph nodes.         Previous HPI 05/07/2024 Christy Lowe is a 44 y.o. female here for follow up for SLE on saphnelo  300 mg IV monthly and HCQ 400 mg daily.  She presents with concerns about  medication side effects and persistent symptoms.   She has a persistent rash on her face and neck, attributed to sun exposure, causing small blisters. She has been taking minocycline , initially prescribed at one pill twice a day, but due to queasiness, she is only taking one pill at night. Despite the reduced dosage, there is improvement in the rash especially around the edges of the face and sides of neck.   Her current lupus medication, Saphnelo , is not working as effectively as before. She experienced better results when the infusion was administered more slowly during a previous session. Recently, she has been experiencing pain earlier in the month than usual, which typically occurs closer to her infusion date.  Some may be seeing a very good response between 2 to 3 weeks.   She describes a new symptom of her left eyelid getting 'stuck' intermittently without associated pain or irritation. This occurs randomly and is not related to the time of day or fatigue.   She experiences right leg swelling, particularly in the morning, which worsens throughout the day. She recently underwent a CT scan to check for any blockages, as previous ultrasounds were inconclusive.   She experiences pain in her hand, particularly when gripping, and notes a grinding sensation.   Previous HPI 02/05/2024 Christy Lowe is a 44 y.o. female here for follow up for SLE on saphnelo  300 mg IV monthly and HCQ 400 mg daily.   She experiences joint swelling and pain, particularly in the morning, which improves after receiving her regular medication, Saphnelo . However, the relief from this medication seems to be diminishing over time. She also takes hydroxychloroquine , cyclobenzaprine , and gabapentin .   She has ongoing skin issues characterized by blister-like lesions that resemble 'poison ivy blisters' or 'water blisters'. These lesions are not itchy but burn and take weeks to heal. They are located on her back, chest, and  face, particularly around the hairline and neck. She has tried various topical treatments without improvement. She was previously misdiagnosed with MS and later prescribed Accutane, which her insurance denied. She has not been able to start this treatment and has not heard back from the prescribing physician's office.   She has swelling in her right leg, which is larger than the left by about two and a half inches by the end of the day. She has tried compression socks and Lasix  without significant improvement. The swelling is persistent and does not respond well to these treatments.   She has a history of a hysterectomy and reports that some of her symptoms have improved since the surgery. She has not been sick recently and has not taken antibiotics.        Previous HPI 11/07/23 Christy MOTE Reddy is a 44 y.o. female here for follow up for systemic lupus on hydroxychloroquine   400 mg daily, Saphnelo  300 mg IV monthly.  She presents with persistent itching and the development of water-filled blisters on the face, neck, and chest. The itching is described as severe, with the patient noting that it feels like they want to scratch their skin off. The itching and blistering are temporarily relieved following their regular infusions, but symptoms return in the week leading up to the next scheduled infusion.   The patient also reports cold, tingling sensations in their toes, and severe heel pain when in contact with the bed. They note that these symptoms also improve following their infusions but return as the effects of the infusion wear off.   In addition to these symptoms, the patient has been experiencing joint stiffness, which also worsens in the week leading up to their infusion. They have a history of transient ischemic attacks (TIAs), but have not experienced one recently.   The patient has been on a regimen of gabapentin  and hydroxyzine  to manage their symptoms. They report that the gabapentin  has  been helpful in managing the itching, and they continue to take hydroxyzine  at night to prevent severe itching episodes.   The patient has a history of high D-dimer levels and has been advised to take a daily aspirin. They also have hemangiomas throughout their body on imaging. Despite these findings, extensive testing has not revealed any blood clots.   The patient's symptoms and their trajectory have led to multiple diagnoses, including MS and lupus. However, the patient expresses frustration with the lack of a definitive diagnosis and treatment plan. They are seeking a solution to alleviate their persistent itching and blistering, as well as their other symptoms. She was recommended to have screening for antiphospholipid syndrome per neurologist, who otherwise found age advanced degenerative change on MRI but no inflammatory lesions.     Previous HPI 08/08/2023 Christy MOTE Forcier is a 44 y.o. female here for follow up for systemic lupus on hydroxychloroquine  400 mg daily, Saphnelo  300 mg IV monthly.  Last treatment infusion was on August 28 still noticing a improvement immediately afterwards but with some decline currently has active symptoms again only 2 weeks later.  She did have a recent work trip to [place].  Experienced some increased leg swelling after plane flight despite use of compressive sock and taking Lasix .  Still has the active rash with itching and bumps that appear fluid-filled and subsequently unroofed by scratching along the jawline and sides of the neck.  Has experienced a few more painful ulcers most often on the roof of the mouth.   She saw Dr. Jorizzo for evaluation August 20. He did not see evidence consistent for active cutaneous lupus.  Recommended neurology consultation for suspected MS. previously saw local neurology with negative antibody testing and normal nerve conduction study had not recommended any continued treatment there was treated for several years previously.  Current  appointment for this now scheduled in January.   Previous HPI 05/15/2023 Christy MOTE Housewright is a 44 y.o. female here for follow up for systemic lupus on hydroxychloroquine  400 mg daily, Saphnelo  300 mg IV monthly.  No new severe disease flareup.  She is seeing definite benefit with her infusions but notices some waning efficacy especially in the last week between dosing this was noticeable she thinks the last 4 treatments.  Also having issue with persistent skin problems legs are very itchy without visible rashes.  She is trying multiple medications for this including Allegra and the hydroxyzine  with neurologist partially helpful.  Had a recent sinus infection but with prolonged course had x-ray concerning for left upper lung infiltrate so was treated with a Z-Pak.  No other infections or antibiotics since her last visit.   Previous HPI 02/19/23 Christy MOTE Strom is a 44 y.o. female here for follow up for SLE on hydroxychloroquine  400 mg daily and Saphnelo  300 mg IV monthly.  Symptoms with arthritis, rashes, mucosal ulcers, pedal edema.  Since her last visit is recently started to do a lot worse with biggest complaint of increased swelling.  She has been noticing discoloration and puffiness especially in the fingers on both hands.  Also somewhat more leg swelling.  She has tried taking the Lasix  and not seeing a large increase in your output nor any improvement in the leg swelling.  She has been having what feels like UTIs about monthly.  Predominantly urinary urgency and frequency no other associated symptoms.  Skin rashes have been ongoing around the neck and jaw area about the same as before.  Does not see discrete joint swelling so much as the overall puffiness but does have increased pain and stiffness in both hands. Sometimes going numb and particularly has trouble gripping tightly cannot seem to exert a normal amount of force.    Previous HPI 11/07/22 Christy MOTE Birt is a 44 y.o. female here for  follow up for SLE on HCQ 400 mg daily and saphnelo  300 mg IV monthly. Symptoms including arthritis, rashes, mucosal ulcers and pedal edema.  She is noticing a good benefit with the Saphnelo  infusions but describes pain in her joints and generalized sensitivity gets worse again by at least 1 week before each next treatment is due.  Noticing a few new areas of swelling of her hands and skin discoloration on her hands the rashes around her head and neck are about the same.  She had recent evaluation at [facility] with no superficial veins amenable to procedure intervention identified.  The swelling is becoming faster to develop and somewhat more extensive she notices mild discoloration around the lower shin and ankle described a bluish hue when very swollen.  She quit her job due to these problems knowing that the stress of working continuous 12-hour shifts was worsening symptoms and having to spend some many months in [place]  or travel throughout the country was very problematic for maintaining medication access.   Previous HPI 08/09/22 Christy MOTE Nill is a 44 y.o. female here for follow up for lupus on HCQ 400 mg daily and saphnelo  300 mg IV monthly.  Her joint symptoms have remained pretty well controlled after completely tapering off the prednisone .  She is noticing a difference in symptoms after the Saphnelo  infusion versus 4 weeks later proceeding her next dose.  Continues to have very little appetite but not seeing any weight reduction.  Skin itching especially on her upper legs is a persistent issue she is using topical emollients with partial benefit.  More recently she is also experiencing an increase in urinary frequency both daytime and increased nocturia.  No visible blood in urine or dysuria.  She is currently anticipating an upcoming plastic surgery needing adjustments for perioperative drug management.   Previous HPI 05/01/2022 Christy MOTE Casso is a 44 y.o. female here for follow up for lupus on HCQ  400 mg daily started saphnelo  infusion and prednisone  tapering down to 2.5 mg daily now. So far she is noticing some improvement with overall severity of skin redness swelling and itching but definitely not resolved.  Face and chest continue having the most erythema.  She has noticed some bruising without any specific cause.  She is noticing some swelling or otherwise thickening in the joints of her right hand and around the right wrist.   Previous HPI 04/06/2022 Christy MOTE Risse is a 44 y.o. female here for follow up for lupus on HCQ 400 mg daily started saphnelo  infusion last month and prednisone  tapering down to 2.5 mg daily now. She felt a large improvement after her first treatment with decreased joint pains and decreased abdominal pain. She is tapering off the prednisone  without severe problem. She has noticed oral ulcers on the top of her mouth for several days and is having a rash with small papules on both sides of her neck and lower face. Appetite is decreased but not losing any weight. She feels a lot of stress after dealing with work incident.   Previous HPI 03/06/2022 Christy MOTE Rusher is a 44 y.o. female here for follow up for lupus on HCQ 400 mg daily and prednisone  tapering down to 10 mg daily after increase to 30 last month from flare up with mouth ulcers and diffuse rashes. She had several complications with high dose prednisone  increased weight, mood irritability, and wound dehiscence af hysterectomy cuff site. We discussed plan to start saphenlo infusion as a replacement for benlysta  treatment which did not improve symptoms or lab abnormalities much after about 6 months treatment.  She has some improvement in the mouth and lip ulcers she is not sure whether starting the valacyclovir  about 4 days ago has had significant effect.  She completed the course of oral antibiotics abdominal pain is partially better. She is scheduled for first saphnelo  infusion Thursday and has OBGYN followup later  this week.     Previous HPI 12/13/21 Christy MOTE Shellhammer is a 44 y.o. female here for follow up for systemic lupus with ongoing joint pains and skin rash on HCQ 400 mg daily, Benlysta  200 mg [place] weekly, and prednisone  10 mg daily. Since our last visit she went for hysterectomy this was uneventful and has recovered well. She was off the benlysta  for about 1 month in total around the surgery. She has not felt much improvement so far still having a lot of fatigue, skin rash and flushing, joint pain in several areas with left wrist swelling. Headaches are about the same as before but now having recurrent episodes of vertigo. She is now taking hydroxyzine  twice on some days due to persistent itching, and feels excessively hot most of the time. She Is back to taking 10 mg of prednisone  when trying to drop this to 5 mg felt extreme fatigue and back pain that felt like bruising and stiffness all over her body.   Previous HPI 11/17/20 Christy MOTE Shirah is a 44 y.o. female here for evaluation of positive ANA. She is noticing multiple symptoms including generalized alopecia, oral dryness, left sided neck swelling, joint pain and stiffness, position tremor, and episodic skin rashes. She has had multiple ongoing problems since many years ago. She was hospitalized for viral meningitis at age 10 and feels she has been susceptible to infections ever since this time or before, with numerous cases of bronchitis and has had some lung nodules on CT imaging during these repeat episodes. About 7 years ago she suffered from a RLE DVT provoked after right knee arthroscopy and developed right sided face and arm numbness. Workup indicated she had a TIA also noted to have PFO. She takes  ASA since that time and has taken anticoagulation in perioperative periods. Workup with MRI and LP studies were apparently also indicative for MS. She was treated with multiple rounds of steroids for months at a time and felt a lot of mood disturbance  from these. She was never started on other immunomodulatory or immunosuppressive treatments.   No Rheumatology ROS completed.   PMFS History:  Patient Active Problem List   Diagnosis Date Noted   Vitamin D  deficiency 11/07/2023   Localized swelling of right lower leg 11/01/2022   Chronic venous insufficiency of lower extremity 11/01/2022   Hyperglycemia 05/23/2022   Obesity without serious comorbidity 05/23/2022   Rash and other nonspecific skin eruption 04/06/2022   Leukocytosis 03/06/2022   Redness 11/14/2021   Blurry vision 11/07/2021   Hot flash not due to menopause 11/04/2021   S/P laparoscopic hysterectomy 10/31/2021   Urinary urgency 09/09/2021   Itching 09/09/2021   High risk medication use 07/25/2021   Finger joint swelling, left 04/01/2021   Systemic lupus erythematosus (HCC) 04/01/2021   Fibromyalgia 04/01/2021   Alopecia 11/17/2020   Other fatigue 10/12/2020   Migraine without aura    History of DVT (deep vein thrombosis)    History of endometriosis    Right-sided muscle weakness 05/15/2019   Abnormal finding on MRI of brain 05/15/2019   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   Tremor 12/24/2017    Past Medical History:  Diagnosis Date   aub    Bruises easily 10/24/2021   Complication of anesthesia 10/15/2020   hypotension   Fibromyalgia    GERD (gastroesophageal reflux disease)    Heart murmur  PFO    off 81 mg aspirin since feb 2022 per pcp  no cardiologist   Hemangioma    History of DVT (deep vein thrombosis)    right le 7 yrs ago, right leg slight swollen   History of endometriosis    History of hiatal hernia    Hypotension    with 10-15-2020 endoscopy   Long-term corticosteroid use 02/20/2022   Corticosteroid   Migraine without aura    PFO (patent foramen ovale)    Systemic lupus erythematosus (HCC)    TIA    ages 69 and 59   Vascular insufficiency    Viral meningitis    age 28    Family History  Problem Relation Age of Onset   Alcohol  abuse Mother    Cancer Father        Lung and Colon   Heart attack Father    COPD Father    Diabetes Maternal Grandmother    Cervical cancer Maternal Grandmother    Fibromyalgia Maternal Grandmother    Breast cancer Maternal Grandmother        ? age of dx   Prostate cancer Maternal Grandfather    Heart attack Maternal Grandfather    ALS Paternal Grandmother    Prostate cancer Paternal Grandfather    Heart attack Paternal Grandfather    Esophageal cancer Paternal Grandfather    Breast cancer Paternal Aunt        43's   Ovarian cancer Paternal Aunt        58's   Breast cancer Other    Lung cancer Maternal Uncle    Colon cancer Neg Hx    Stomach cancer Neg Hx    Rectal cancer Neg Hx    Past Surgical History:  Procedure Laterality Date   ABDOMINAL HYSTERECTOMY     ANTERIOR CRUCIATE LIGAMENT REPAIR Left  10/2018   CYSTOSCOPY  10/31/2021   Procedure: CYSTOSCOPY;  Surgeon: Jannis Kate Norris, MD;  Location: [facility];  Service: Gynecology;;   PELVIC LAPAROSCOPY     for endometriosis yrs ago per pt on 10-24-2021   right knee lateral release and meniscal repair  2011   TONSILLECTOMY     age 40, adenoids removed also   TOTAL LAPAROSCOPIC HYSTERECTOMY WITH SALPINGECTOMY Bilateral 10/31/2021   Procedure: TOTAL LAPAROSCOPIC HYSTERECTOMY WITH SALPINGECTOMY;  Surgeon: Jannis Kate Norris, MD;  Location: [facility];  Service: Gynecology;  Laterality: Bilateral;   UMBILICAL HERNIA REPAIR     yrs ago   UPPER GI ENDOSCOPY     with esophagus stretching 10-15-2020, 03-11-2020, 05-09-2019,  2013 with being polyp removed   Social History   Social History Narrative   Not on file   Immunization History  Administered Date(s) Administered   PFIZER(Purple Top)SARS-COV-2 Vaccination 06/27/2020, 07/28/2020   PPD Test 01/23/2019     Objective: Vital Signs: LMP 09/23/2021    Physical Exam   Musculoskeletal Exam: ***   Investigation: No additional  findings.  Imaging: No results found.  Recent Labs: Lab Results  Component Value Date   WBC 5.1 08/06/2024   HGB 14.0 08/06/2024   PLT 246 08/06/2024   NA 137 08/06/2024   K 4.4 08/06/2024   CL 103 08/06/2024   CO2 26 08/06/2024   GLUCOSE 87 08/06/2024   BUN 15 08/06/2024   CREATININE 1.01 (H) 08/06/2024   BILITOT 0.4 08/06/2024   ALKPHOS 47 10/26/2021   AST 22 08/06/2024   ALT 16 08/06/2024   PROT 7.4 08/06/2024   ALBUMIN 4.9 10/26/2021   CALCIUM 9.9 08/06/2024   GFRAA >60 03/10/2020   QFTBGOLDPLUS NEGATIVE 05/07/2024    Speciality Comments: PLQ Eye Exam  Ophthalmology 01/10/2024 WNL Follow-up: 12 months Appt 12/01/2023 per patient  Patient requests 90 day prescriptions  Procedures:  No procedures performed Allergies: Cephalexin, Penicillins, Sulfa antibiotics, and Lubiprostone   Assessment / Plan:     Visit Diagnoses:  Assessment & Plan Systemic lupus erythematosus, unspecified SLE type, unspecified organ involvement status (HCC)     High risk medication use      ***  Follow-Up Instructions: No follow-ups on file.   Sue Mcalexander M Maika Kaczmarek, CMA  Note - This record has been created using Animal nutritionist.  Chart creation errors have been sought, but may not always  have been located. Such creation errors do not reflect on  the standard of medical care. "

## 2025-01-15 ENCOUNTER — Ambulatory Visit: Admitting: Internal Medicine

## 2025-01-15 DIAGNOSIS — M329 Systemic lupus erythematosus, unspecified: Secondary | ICD-10-CM

## 2025-01-15 DIAGNOSIS — Z79899 Other long term (current) drug therapy: Secondary | ICD-10-CM

## 2025-03-23 ENCOUNTER — Ambulatory Visit: Admitting: Obstetrics and Gynecology
